# Patient Record
Sex: Male | Born: 1935 | Race: White | Hispanic: No | Marital: Married | Smoking: Former smoker
Health system: Southern US, Community
[De-identification: ages and names within clinical notes are randomized; demographics above are authoritative.]

## PROBLEM LIST (undated history)

## (undated) ENCOUNTER — Emergency Department (HOSPITAL_COMMUNITY): Discharge status: Absent without leave | Payer: Medicare HMO

## (undated) DIAGNOSIS — I10 Essential (primary) hypertension: Secondary | ICD-10-CM

## (undated) DIAGNOSIS — I442 Atrioventricular block, complete: Secondary | ICD-10-CM

## (undated) DIAGNOSIS — C801 Malignant (primary) neoplasm, unspecified: Secondary | ICD-10-CM

## (undated) DIAGNOSIS — N529 Male erectile dysfunction, unspecified: Secondary | ICD-10-CM

## (undated) DIAGNOSIS — K224 Dyskinesia of esophagus: Secondary | ICD-10-CM

## (undated) DIAGNOSIS — J449 Chronic obstructive pulmonary disease, unspecified: Secondary | ICD-10-CM

## (undated) DIAGNOSIS — K219 Gastro-esophageal reflux disease without esophagitis: Secondary | ICD-10-CM

## (undated) DIAGNOSIS — I872 Venous insufficiency (chronic) (peripheral): Secondary | ICD-10-CM

## (undated) DIAGNOSIS — N289 Disorder of kidney and ureter, unspecified: Secondary | ICD-10-CM

## (undated) DIAGNOSIS — M1990 Unspecified osteoarthritis, unspecified site: Secondary | ICD-10-CM

## (undated) DIAGNOSIS — E785 Hyperlipidemia, unspecified: Secondary | ICD-10-CM

## (undated) DIAGNOSIS — I350 Nonrheumatic aortic (valve) stenosis: Secondary | ICD-10-CM

## (undated) DIAGNOSIS — Z950 Presence of cardiac pacemaker: Secondary | ICD-10-CM

## (undated) DIAGNOSIS — N400 Enlarged prostate without lower urinary tract symptoms: Secondary | ICD-10-CM

## (undated) DIAGNOSIS — K5730 Diverticulosis of large intestine without perforation or abscess without bleeding: Secondary | ICD-10-CM

## (undated) HISTORY — DX: Disorder of kidney and ureter, unspecified: N28.9

## (undated) HISTORY — DX: Atrioventricular block, complete: I44.2

## (undated) HISTORY — PX: COLONOSCOPY: SHX174

## (undated) HISTORY — PX: TONSILLECTOMY: SUR1361

## (undated) HISTORY — DX: Benign prostatic hyperplasia without lower urinary tract symptoms: N40.0

## (undated) HISTORY — DX: Malignant (primary) neoplasm, unspecified: C80.1

## (undated) HISTORY — DX: Essential (primary) hypertension: I10

## (undated) HISTORY — PX: CHOLECYSTECTOMY: SHX55

## (undated) HISTORY — DX: Unspecified osteoarthritis, unspecified site: M19.90

## (undated) HISTORY — DX: Hyperlipidemia, unspecified: E78.5

## (undated) HISTORY — DX: Diverticulosis of large intestine without perforation or abscess without bleeding: K57.30

## (undated) HISTORY — DX: Presence of cardiac pacemaker: Z95.0

## (undated) HISTORY — DX: Gastro-esophageal reflux disease without esophagitis: K21.9

## (undated) HISTORY — DX: Chronic obstructive pulmonary disease, unspecified: J44.9

## (undated) HISTORY — DX: Venous insufficiency (chronic) (peripheral): I87.2

## (undated) HISTORY — DX: Dyskinesia of esophagus: K22.4

## (undated) HISTORY — DX: Male erectile dysfunction, unspecified: N52.9

## (undated) HISTORY — DX: Nonrheumatic aortic (valve) stenosis: I35.0

## (undated) HISTORY — PX: VARICOSE VEIN SURGERY: SHX832

## (undated) HISTORY — PX: OTHER SURGICAL HISTORY: SHX169

---

## 1988-04-04 HISTORY — PX: PROSTATE SURGERY: SHX751

## 1989-04-04 HISTORY — PX: LOBECTOMY: SHX5089

## 2003-01-24 ENCOUNTER — Encounter: Payer: Self-pay | Admitting: Internal Medicine

## 2003-01-24 DIAGNOSIS — K644 Residual hemorrhoidal skin tags: Secondary | ICD-10-CM | POA: Insufficient documentation

## 2003-01-24 LAB — HM COLONOSCOPY

## 2003-11-03 HISTORY — PX: OTHER SURGICAL HISTORY: SHX169

## 2004-05-25 ENCOUNTER — Ambulatory Visit: Payer: Self-pay | Admitting: Family Medicine

## 2004-06-23 ENCOUNTER — Ambulatory Visit: Payer: Self-pay | Admitting: Family Medicine

## 2004-07-03 HISTORY — PX: ESOPHAGOGASTRODUODENOSCOPY: SHX1529

## 2004-07-07 ENCOUNTER — Ambulatory Visit: Payer: Self-pay | Admitting: Family Medicine

## 2004-08-02 ENCOUNTER — Ambulatory Visit: Payer: Self-pay | Admitting: Cardiovascular Disease

## 2004-09-20 ENCOUNTER — Ambulatory Visit: Payer: Self-pay | Admitting: Internal Medicine

## 2004-10-01 ENCOUNTER — Ambulatory Visit: Payer: Self-pay | Admitting: Internal Medicine

## 2004-10-01 DIAGNOSIS — K222 Esophageal obstruction: Secondary | ICD-10-CM

## 2004-10-06 ENCOUNTER — Ambulatory Visit: Payer: Self-pay | Admitting: Family Medicine

## 2004-10-06 LAB — CONVERTED CEMR LAB: PSA: 0.9 ng/mL

## 2004-10-07 ENCOUNTER — Ambulatory Visit: Payer: Self-pay | Admitting: Family Medicine

## 2005-01-28 ENCOUNTER — Ambulatory Visit: Payer: Self-pay | Admitting: Family Medicine

## 2005-08-02 HISTORY — PX: OTHER SURGICAL HISTORY: SHX169

## 2005-08-09 ENCOUNTER — Ambulatory Visit: Payer: Self-pay | Admitting: Family Medicine

## 2005-08-18 ENCOUNTER — Encounter: Admission: RE | Admit: 2005-08-18 | Discharge: 2005-08-18 | Payer: Self-pay | Admitting: Family Medicine

## 2005-08-18 ENCOUNTER — Ambulatory Visit: Payer: Self-pay | Admitting: Cardiovascular Disease

## 2005-09-02 HISTORY — PX: DOPPLER ECHOCARDIOGRAPHY: SHX263

## 2005-09-22 ENCOUNTER — Ambulatory Visit: Payer: Self-pay | Admitting: Cardiovascular Disease

## 2005-09-22 ENCOUNTER — Encounter: Payer: Self-pay | Admitting: Cardiology

## 2005-09-22 ENCOUNTER — Ambulatory Visit: Payer: Self-pay

## 2005-10-02 HISTORY — PX: ESOPHAGOGASTRODUODENOSCOPY: SHX1529

## 2005-10-07 ENCOUNTER — Ambulatory Visit: Payer: Self-pay | Admitting: Family Medicine

## 2005-11-02 HISTORY — PX: OTHER SURGICAL HISTORY: SHX169

## 2005-12-07 ENCOUNTER — Ambulatory Visit: Payer: Self-pay | Admitting: Cardiovascular Disease

## 2005-12-12 ENCOUNTER — Ambulatory Visit: Payer: Self-pay | Admitting: Family Medicine

## 2006-01-24 ENCOUNTER — Ambulatory Visit: Payer: Self-pay | Admitting: Family Medicine

## 2006-04-10 ENCOUNTER — Ambulatory Visit: Payer: Self-pay | Admitting: Family Medicine

## 2006-04-28 ENCOUNTER — Ambulatory Visit: Payer: Self-pay | Admitting: Family Medicine

## 2006-07-24 ENCOUNTER — Encounter: Payer: Self-pay | Admitting: Family Medicine

## 2006-07-24 DIAGNOSIS — M1990 Unspecified osteoarthritis, unspecified site: Secondary | ICD-10-CM | POA: Insufficient documentation

## 2006-07-24 DIAGNOSIS — Z85118 Personal history of other malignant neoplasm of bronchus and lung: Secondary | ICD-10-CM

## 2006-07-24 DIAGNOSIS — Z87898 Personal history of other specified conditions: Secondary | ICD-10-CM

## 2006-07-24 DIAGNOSIS — K219 Gastro-esophageal reflux disease without esophagitis: Secondary | ICD-10-CM

## 2006-07-24 DIAGNOSIS — E785 Hyperlipidemia, unspecified: Secondary | ICD-10-CM

## 2006-07-24 DIAGNOSIS — F528 Other sexual dysfunction not due to a substance or known physiological condition: Secondary | ICD-10-CM

## 2006-07-24 DIAGNOSIS — J449 Chronic obstructive pulmonary disease, unspecified: Secondary | ICD-10-CM

## 2006-07-24 DIAGNOSIS — K5730 Diverticulosis of large intestine without perforation or abscess without bleeding: Secondary | ICD-10-CM | POA: Insufficient documentation

## 2006-07-24 DIAGNOSIS — I10 Essential (primary) hypertension: Secondary | ICD-10-CM | POA: Insufficient documentation

## 2006-07-24 DIAGNOSIS — I2510 Atherosclerotic heart disease of native coronary artery without angina pectoris: Secondary | ICD-10-CM | POA: Insufficient documentation

## 2006-07-24 DIAGNOSIS — I872 Venous insufficiency (chronic) (peripheral): Secondary | ICD-10-CM | POA: Insufficient documentation

## 2006-07-24 DIAGNOSIS — T7840XA Allergy, unspecified, initial encounter: Secondary | ICD-10-CM | POA: Insufficient documentation

## 2006-07-24 DIAGNOSIS — J4489 Other specified chronic obstructive pulmonary disease: Secondary | ICD-10-CM | POA: Insufficient documentation

## 2006-09-01 ENCOUNTER — Telehealth (INDEPENDENT_AMBULATORY_CARE_PROVIDER_SITE_OTHER): Payer: Self-pay | Admitting: *Deleted

## 2006-09-29 ENCOUNTER — Telehealth (INDEPENDENT_AMBULATORY_CARE_PROVIDER_SITE_OTHER): Payer: Self-pay | Admitting: *Deleted

## 2006-10-25 ENCOUNTER — Ambulatory Visit: Payer: Self-pay | Admitting: Family Medicine

## 2006-10-25 DIAGNOSIS — M109 Gout, unspecified: Secondary | ICD-10-CM | POA: Insufficient documentation

## 2006-12-12 ENCOUNTER — Telehealth (INDEPENDENT_AMBULATORY_CARE_PROVIDER_SITE_OTHER): Payer: Self-pay | Admitting: *Deleted

## 2006-12-12 ENCOUNTER — Ambulatory Visit: Payer: Self-pay | Admitting: Cardiovascular Disease

## 2007-01-12 ENCOUNTER — Ambulatory Visit: Payer: Self-pay | Admitting: Internal Medicine

## 2007-01-16 ENCOUNTER — Encounter: Admission: RE | Admit: 2007-01-16 | Discharge: 2007-01-16 | Payer: Self-pay | Admitting: Internal Medicine

## 2007-01-16 DIAGNOSIS — K449 Diaphragmatic hernia without obstruction or gangrene: Secondary | ICD-10-CM | POA: Insufficient documentation

## 2007-02-07 ENCOUNTER — Ambulatory Visit: Payer: Self-pay | Admitting: Internal Medicine

## 2007-03-06 ENCOUNTER — Telehealth (INDEPENDENT_AMBULATORY_CARE_PROVIDER_SITE_OTHER): Payer: Self-pay | Admitting: *Deleted

## 2007-03-14 ENCOUNTER — Encounter (INDEPENDENT_AMBULATORY_CARE_PROVIDER_SITE_OTHER): Payer: Self-pay | Admitting: *Deleted

## 2007-03-20 ENCOUNTER — Telehealth (INDEPENDENT_AMBULATORY_CARE_PROVIDER_SITE_OTHER): Payer: Self-pay | Admitting: *Deleted

## 2007-03-20 ENCOUNTER — Ambulatory Visit: Payer: Self-pay | Admitting: Family Medicine

## 2007-03-21 LAB — CONVERTED CEMR LAB
AST: 21 units/L (ref 0–37)
Albumin: 4 g/dL (ref 3.5–5.2)
BUN: 22 mg/dL (ref 6–23)
Basophils Absolute: 0 10*3/uL (ref 0.0–0.1)
Basophils Relative: 0.6 % (ref 0.0–1.0)
CO2: 28 meq/L (ref 19–32)
Calcium: 9.7 mg/dL (ref 8.4–10.5)
Eosinophils Relative: 4.1 % (ref 0.0–5.0)
GFR calc Af Amer: 57 mL/min
GFR calc non Af Amer: 47 mL/min
HCT: 41.7 % (ref 39.0–52.0)
HDL: 34.4 mg/dL — ABNORMAL LOW (ref >39.0)
Hemoglobin: 14.5 g/dL (ref 13.0–17.0)
Lymphocytes Relative: 34.9 % (ref 12.0–46.0)
MCHC: 34.7 g/dL (ref 30.0–36.0)
Monocytes Absolute: 0.5 10*3/uL (ref 0.2–0.7)
Neutro Abs: 2.8 10*3/uL (ref 1.4–7.7)
Neutrophils Relative %: 50.5 % (ref 43.0–77.0)
Phosphorus: 2.9 mg/dL (ref 2.3–4.6)
RBC: 4.44 M/uL (ref 4.22–5.81)
Sodium: 141 meq/L (ref 135–145)
Triglycerides: 269 mg/dL (ref 0–149)

## 2007-06-03 HISTORY — PX: OTHER SURGICAL HISTORY: SHX169

## 2007-06-08 ENCOUNTER — Telehealth: Payer: Self-pay | Admitting: Family Medicine

## 2007-06-12 ENCOUNTER — Ambulatory Visit: Payer: Self-pay | Admitting: Internal Medicine

## 2007-06-13 ENCOUNTER — Ambulatory Visit: Payer: Self-pay | Admitting: Cardiovascular Disease

## 2007-06-21 ENCOUNTER — Ambulatory Visit: Payer: Self-pay | Admitting: Family Medicine

## 2007-07-03 ENCOUNTER — Ambulatory Visit: Payer: Self-pay

## 2007-07-03 ENCOUNTER — Encounter: Payer: Self-pay | Admitting: Cardiovascular Disease

## 2007-07-05 ENCOUNTER — Ambulatory Visit: Payer: Self-pay | Admitting: Family Medicine

## 2007-10-09 ENCOUNTER — Ambulatory Visit: Payer: Self-pay | Admitting: Family Medicine

## 2007-10-10 ENCOUNTER — Encounter (INDEPENDENT_AMBULATORY_CARE_PROVIDER_SITE_OTHER): Payer: Self-pay | Admitting: *Deleted

## 2007-10-10 LAB — CONVERTED CEMR LAB
ALT: 19 units/L (ref 0–53)
Direct LDL: 71.3 mg/dL
HDL: 32.8 mg/dL — ABNORMAL LOW (ref >39.0)
Total CHOL/HDL Ratio: 5.6
Triglycerides: 334 mg/dL (ref 0–149)
VLDL: 67 mg/dL — ABNORMAL HIGH (ref 0–40)

## 2008-01-02 ENCOUNTER — Ambulatory Visit: Payer: Self-pay | Admitting: Cardiovascular Disease

## 2008-01-09 ENCOUNTER — Ambulatory Visit: Payer: Self-pay | Admitting: Family Medicine

## 2008-01-10 LAB — CONVERTED CEMR LAB
AST: 18 units/L (ref 0–37)
BUN: 27 mg/dL — ABNORMAL HIGH (ref 6–23)
Bilirubin, Direct: 0.1 mg/dL (ref 0.0–0.3)
Calcium: 9.3 mg/dL (ref 8.4–10.5)
Chloride: 109 meq/L (ref 96–112)
Cholesterol: 170 mg/dL (ref 0–200)
GFR calc Af Amer: 62 mL/min
GFR calc non Af Amer: 51 mL/min
Phosphorus: 3.3 mg/dL (ref 2.3–4.6)
Potassium: 4.7 meq/L (ref 3.5–5.1)
Sodium: 143 meq/L (ref 135–145)
Total Bilirubin: 0.7 mg/dL (ref 0.3–1.2)
Triglycerides: 205 mg/dL (ref 0–149)

## 2008-01-22 ENCOUNTER — Telehealth: Payer: Self-pay | Admitting: Family Medicine

## 2008-01-29 ENCOUNTER — Ambulatory Visit: Payer: Self-pay | Admitting: Internal Medicine

## 2008-02-11 ENCOUNTER — Telehealth: Payer: Self-pay | Admitting: Family Medicine

## 2008-02-22 ENCOUNTER — Ambulatory Visit: Payer: Self-pay | Admitting: Family Medicine

## 2008-02-22 DIAGNOSIS — N184 Chronic kidney disease, stage 4 (severe): Secondary | ICD-10-CM

## 2008-04-29 ENCOUNTER — Ambulatory Visit: Payer: Self-pay | Admitting: Cardiovascular Disease

## 2008-06-09 ENCOUNTER — Telehealth: Payer: Self-pay | Admitting: Internal Medicine

## 2008-06-30 ENCOUNTER — Telehealth: Payer: Self-pay | Admitting: Family Medicine

## 2008-07-08 DIAGNOSIS — M129 Arthropathy, unspecified: Secondary | ICD-10-CM | POA: Insufficient documentation

## 2008-07-09 ENCOUNTER — Encounter: Payer: Self-pay | Admitting: Cardiovascular Disease

## 2008-07-09 ENCOUNTER — Ambulatory Visit: Payer: Self-pay

## 2008-07-09 ENCOUNTER — Ambulatory Visit: Payer: Self-pay | Admitting: Cardiovascular Disease

## 2008-08-25 ENCOUNTER — Telehealth: Payer: Self-pay | Admitting: Cardiovascular Disease

## 2008-08-25 ENCOUNTER — Ambulatory Visit: Payer: Self-pay | Admitting: Family Medicine

## 2008-08-26 ENCOUNTER — Telehealth: Payer: Self-pay | Admitting: Cardiovascular Disease

## 2008-08-26 LAB — CONVERTED CEMR LAB
Alkaline Phosphatase: 118 units/L — ABNORMAL HIGH (ref 39–117)
Calcium: 9.3 mg/dL (ref 8.4–10.5)
Cholesterol: 180 mg/dL (ref 0–200)
Creatinine, Ser: 1.5 mg/dL (ref 0.4–1.5)
Direct LDL: 70.2 mg/dL
HDL: 32.6 mg/dL — ABNORMAL LOW (ref >39.00)
Potassium: 4.6 meq/L (ref 3.5–5.1)
Total CHOL/HDL Ratio: 6
Triglycerides: 309 mg/dL — ABNORMAL HIGH (ref 0.0–149.0)
VLDL: 61.8 mg/dL — ABNORMAL HIGH (ref 0.0–40.0)

## 2008-08-27 ENCOUNTER — Ambulatory Visit: Payer: Self-pay | Admitting: Cardiovascular Disease

## 2008-08-27 DIAGNOSIS — I4949 Other premature depolarization: Secondary | ICD-10-CM | POA: Insufficient documentation

## 2008-08-27 DIAGNOSIS — R55 Syncope and collapse: Secondary | ICD-10-CM

## 2008-08-27 DIAGNOSIS — I359 Nonrheumatic aortic valve disorder, unspecified: Secondary | ICD-10-CM | POA: Insufficient documentation

## 2008-09-22 ENCOUNTER — Ambulatory Visit: Payer: Self-pay | Admitting: Cardiovascular Disease

## 2008-10-28 ENCOUNTER — Ambulatory Visit: Payer: Self-pay | Admitting: Family Medicine

## 2008-10-29 LAB — CONVERTED CEMR LAB
ALT: 18 units/L (ref 0–53)
Alkaline Phosphatase: 112 units/L (ref 39–117)
BUN: 29 mg/dL — ABNORMAL HIGH (ref 6–23)
Bilirubin, Direct: 0.1 mg/dL (ref 0.0–0.3)
CO2: 26 meq/L (ref 19–32)
Calcium: 9.5 mg/dL (ref 8.4–10.5)
Cholesterol: 199 mg/dL (ref 0–200)
Creatinine, Ser: 1.6 mg/dL — ABNORMAL HIGH (ref 0.4–1.5)
HDL: 38.1 mg/dL — ABNORMAL LOW (ref >39.00)
Phosphorus: 3.5 mg/dL (ref 2.3–4.6)
Sodium: 142 meq/L (ref 135–145)
Total Bilirubin: 0.9 mg/dL (ref 0.3–1.2)
Total Protein: 7.5 g/dL (ref 6.0–8.3)
VLDL: 60 mg/dL — ABNORMAL HIGH (ref 0.0–40.0)

## 2008-11-07 ENCOUNTER — Ambulatory Visit: Payer: Self-pay | Admitting: Family Medicine

## 2008-11-07 DIAGNOSIS — K5900 Constipation, unspecified: Secondary | ICD-10-CM | POA: Insufficient documentation

## 2008-11-07 LAB — CONVERTED CEMR LAB
Blood in Urine, dipstick: NEGATIVE
Glucose, Urine, Semiquant: NEGATIVE
Protein, U semiquant: NEGATIVE
Urobilinogen, UA: 0.2
pH: 6

## 2008-11-26 ENCOUNTER — Encounter (INDEPENDENT_AMBULATORY_CARE_PROVIDER_SITE_OTHER): Payer: Self-pay | Admitting: *Deleted

## 2008-12-16 ENCOUNTER — Ambulatory Visit: Payer: Self-pay | Admitting: Internal Medicine

## 2008-12-17 LAB — CONVERTED CEMR LAB
Basophils Absolute: 0.1 10*3/uL (ref 0.0–0.1)
Eosinophils Absolute: 0.1 10*3/uL (ref 0.0–0.7)
Eosinophils Relative: 2.6 % (ref 0.0–5.0)
Lymphs Abs: 1.6 10*3/uL (ref 0.7–4.0)
MCV: 94 fL (ref 78.0–100.0)
Monocytes Relative: 8.1 % (ref 3.0–12.0)
Neutro Abs: 3.4 10*3/uL (ref 1.4–7.7)
Neutrophils Relative %: 59.6 % (ref 43.0–77.0)
Platelets: 230 10*3/uL (ref 150.0–400.0)
RBC: 4.57 M/uL (ref 4.22–5.81)
WBC: 5.7 10*3/uL (ref 4.5–10.5)

## 2009-01-12 ENCOUNTER — Telehealth: Payer: Self-pay | Admitting: Family Medicine

## 2009-01-20 ENCOUNTER — Ambulatory Visit: Payer: Self-pay | Admitting: Cardiovascular Disease

## 2009-01-20 DIAGNOSIS — R0602 Shortness of breath: Secondary | ICD-10-CM

## 2009-01-21 ENCOUNTER — Telehealth: Payer: Self-pay | Admitting: Cardiovascular Disease

## 2009-01-22 LAB — CONVERTED CEMR LAB
BUN: 24 mg/dL — ABNORMAL HIGH (ref 6–23)
CO2: 27 meq/L (ref 19–32)
Calcium: 9.4 mg/dL (ref 8.4–10.5)
Glucose, Bld: 88 mg/dL (ref 70–99)
Potassium: 4.6 meq/L (ref 3.5–5.1)
Sodium: 141 meq/L (ref 135–145)

## 2009-01-23 ENCOUNTER — Telehealth: Payer: Self-pay | Admitting: Cardiovascular Disease

## 2009-03-02 ENCOUNTER — Telehealth: Payer: Self-pay | Admitting: Internal Medicine

## 2009-03-04 ENCOUNTER — Telehealth: Payer: Self-pay | Admitting: Family Medicine

## 2009-03-11 ENCOUNTER — Ambulatory Visit: Payer: Self-pay | Admitting: Cardiovascular Disease

## 2009-03-16 ENCOUNTER — Encounter: Payer: Self-pay | Admitting: Emergency Medicine

## 2009-03-16 ENCOUNTER — Encounter: Payer: Self-pay | Admitting: Cardiovascular Disease

## 2009-03-17 ENCOUNTER — Telehealth: Payer: Self-pay | Admitting: Cardiovascular Disease

## 2009-03-18 LAB — CONVERTED CEMR LAB
Calcium: 9 mg/dL (ref 8.4–10.5)
GFR calc non Af Amer: 43.74 mL/min (ref >60)
Pro B Natriuretic peptide (BNP): 1080 pg/mL — ABNORMAL HIGH (ref 0.0–100.0)
Sodium: 144 meq/L (ref 135–145)

## 2009-03-24 ENCOUNTER — Telehealth: Payer: Self-pay | Admitting: Internal Medicine

## 2009-04-17 ENCOUNTER — Telehealth: Payer: Self-pay | Admitting: Cardiovascular Disease

## 2009-04-27 ENCOUNTER — Telehealth: Payer: Self-pay | Admitting: Cardiovascular Disease

## 2009-05-01 ENCOUNTER — Encounter: Payer: Self-pay | Admitting: Cardiovascular Disease

## 2009-05-04 ENCOUNTER — Telehealth: Payer: Self-pay | Admitting: Cardiovascular Disease

## 2009-05-04 ENCOUNTER — Telehealth: Payer: Self-pay | Admitting: Family Medicine

## 2009-05-04 HISTORY — PX: AORTIC VALVE REPLACEMENT: SHX41

## 2009-05-08 HISTORY — PX: PACEMAKER INSERTION: SHX728

## 2009-05-25 ENCOUNTER — Telehealth: Payer: Self-pay | Admitting: Family Medicine

## 2009-06-08 ENCOUNTER — Encounter: Payer: Self-pay | Admitting: Cardiovascular Disease

## 2009-06-17 ENCOUNTER — Telehealth: Payer: Self-pay | Admitting: Family Medicine

## 2009-07-01 ENCOUNTER — Encounter: Payer: Self-pay | Admitting: Cardiovascular Disease

## 2009-07-03 ENCOUNTER — Ambulatory Visit: Payer: Self-pay | Admitting: Cardiovascular Disease

## 2009-07-06 LAB — CONVERTED CEMR LAB
CO2: 28 meq/L (ref 19–32)
Calcium: 9.1 mg/dL (ref 8.4–10.5)
Chloride: 105 meq/L (ref 96–112)
Eosinophils Relative: 1.5 % (ref 0.0–5.0)
HCT: 38.7 % — ABNORMAL LOW (ref 39.0–52.0)
Hemoglobin: 13 g/dL (ref 13.0–17.0)
Lymphocytes Relative: 24 % (ref 12.0–46.0)
Lymphs Abs: 1.5 10*3/uL (ref 0.7–4.0)
MCV: 94.7 fL (ref 78.0–100.0)
Monocytes Relative: 8.8 % (ref 3.0–12.0)
Neutrophils Relative %: 65.3 % (ref 43.0–77.0)
Platelets: 255 10*3/uL (ref 150.0–400.0)
Pro B Natriuretic peptide (BNP): 489 pg/mL — ABNORMAL HIGH (ref 0.0–100.0)
Sodium: 145 meq/L (ref 135–145)
WBC: 6.1 10*3/uL (ref 4.5–10.5)

## 2009-07-13 ENCOUNTER — Telehealth: Payer: Self-pay | Admitting: Cardiovascular Disease

## 2009-07-20 ENCOUNTER — Encounter: Payer: Self-pay | Admitting: Cardiovascular Disease

## 2009-07-20 ENCOUNTER — Telehealth: Payer: Self-pay | Admitting: Cardiovascular Disease

## 2009-07-20 ENCOUNTER — Telehealth: Payer: Self-pay | Admitting: Internal Medicine

## 2009-07-30 ENCOUNTER — Ambulatory Visit: Payer: Self-pay | Admitting: Cardiovascular Disease

## 2009-07-30 ENCOUNTER — Ambulatory Visit: Payer: Self-pay | Admitting: Cardiology

## 2009-07-30 ENCOUNTER — Ambulatory Visit: Payer: Self-pay

## 2009-07-30 ENCOUNTER — Encounter: Payer: Self-pay | Admitting: Cardiovascular Disease

## 2009-07-30 ENCOUNTER — Ambulatory Visit (HOSPITAL_COMMUNITY)
Admission: RE | Admit: 2009-07-30 | Discharge: 2009-07-30 | Payer: Self-pay | Source: Home / Self Care | Admitting: Cardiovascular Disease

## 2009-07-30 DIAGNOSIS — I4891 Unspecified atrial fibrillation: Secondary | ICD-10-CM | POA: Insufficient documentation

## 2009-07-31 ENCOUNTER — Ambulatory Visit: Payer: Self-pay | Admitting: Internal Medicine

## 2009-08-02 ENCOUNTER — Encounter: Payer: Self-pay | Admitting: Cardiovascular Disease

## 2009-08-02 LAB — CONVERTED CEMR LAB
Chloride: 99 meq/L (ref 96–112)
Glucose, Bld: 95 mg/dL (ref 70–99)
Potassium: 3.9 meq/L (ref 3.5–5.1)

## 2009-08-06 ENCOUNTER — Ambulatory Visit: Payer: Self-pay | Admitting: Cardiology

## 2009-08-06 LAB — CONVERTED CEMR LAB: POC INR: 2.5

## 2009-08-11 ENCOUNTER — Ambulatory Visit: Payer: Self-pay | Admitting: Cardiovascular Disease

## 2009-08-14 ENCOUNTER — Encounter (INDEPENDENT_AMBULATORY_CARE_PROVIDER_SITE_OTHER): Payer: Self-pay | Admitting: *Deleted

## 2009-08-14 ENCOUNTER — Ambulatory Visit: Payer: Self-pay | Admitting: Cardiovascular Disease

## 2009-08-18 ENCOUNTER — Ambulatory Visit: Payer: Self-pay | Admitting: Internal Medicine

## 2009-08-28 ENCOUNTER — Ambulatory Visit: Payer: Self-pay | Admitting: Cardiovascular Disease

## 2009-08-28 LAB — CONVERTED CEMR LAB
CO2: 34 meq/L — ABNORMAL HIGH (ref 19–32)
Creatinine, Ser: 2.2 mg/dL — ABNORMAL HIGH (ref 0.4–1.5)
Eosinophils Absolute: 0.1 10*3/uL (ref 0.0–0.7)
Eosinophils Relative: 2.2 % (ref 0.0–5.0)
Glucose, Bld: 73 mg/dL (ref 70–99)
HCT: 40.7 % (ref 39.0–52.0)
Lymphs Abs: 1.9 10*3/uL (ref 0.7–4.0)
MCHC: 32.6 g/dL (ref 30.0–36.0)
MCV: 91.4 fL (ref 78.0–100.0)
Magnesium: 2.4 mg/dL (ref 1.5–2.5)
Monocytes Absolute: 0.7 10*3/uL (ref 0.1–1.0)
Monocytes Relative: 11.2 % (ref 3.0–12.0)
Neutro Abs: 3.8 10*3/uL (ref 1.4–7.7)
POC INR: 4.2
Potassium: 4.4 meq/L (ref 3.5–5.1)
Pro B Natriuretic peptide (BNP): 605.5 pg/mL — ABNORMAL HIGH (ref 0.0–100.0)
RBC: 4.45 M/uL (ref 4.22–5.81)
RDW: 18.5 % — ABNORMAL HIGH (ref 11.5–14.6)
Sodium: 144 meq/L (ref 135–145)

## 2009-09-02 ENCOUNTER — Encounter: Payer: Self-pay | Admitting: Cardiovascular Disease

## 2009-09-04 ENCOUNTER — Ambulatory Visit (HOSPITAL_COMMUNITY): Admission: RE | Admit: 2009-09-04 | Discharge: 2009-09-04 | Payer: Self-pay | Admitting: Cardiovascular Disease

## 2009-09-04 ENCOUNTER — Ambulatory Visit: Payer: Self-pay | Admitting: Cardiovascular Disease

## 2009-09-08 ENCOUNTER — Ambulatory Visit: Payer: Self-pay | Admitting: Cardiology

## 2009-09-18 ENCOUNTER — Ambulatory Visit: Payer: Self-pay | Admitting: Cardiology

## 2009-09-18 ENCOUNTER — Ambulatory Visit: Payer: Self-pay | Admitting: Cardiovascular Disease

## 2009-09-18 LAB — CONVERTED CEMR LAB: POC INR: 1.9

## 2009-09-28 ENCOUNTER — Ambulatory Visit: Payer: Self-pay | Admitting: Cardiology

## 2009-10-12 ENCOUNTER — Ambulatory Visit: Payer: Self-pay | Admitting: Internal Medicine

## 2009-10-16 ENCOUNTER — Ambulatory Visit: Payer: Self-pay | Admitting: Emergency Medicine

## 2009-10-16 ENCOUNTER — Ambulatory Visit
Admission: RE | Admit: 2009-10-16 | Discharge: 2009-10-16 | Payer: Self-pay | Source: Home / Self Care | Admitting: Cardiovascular Disease

## 2009-10-16 DIAGNOSIS — J8410 Pulmonary fibrosis, unspecified: Secondary | ICD-10-CM

## 2009-10-22 ENCOUNTER — Ambulatory Visit: Payer: Self-pay | Admitting: Cardiology

## 2009-10-27 ENCOUNTER — Telehealth (INDEPENDENT_AMBULATORY_CARE_PROVIDER_SITE_OTHER): Payer: Self-pay | Admitting: *Deleted

## 2009-11-16 ENCOUNTER — Ambulatory Visit: Payer: Self-pay | Admitting: Cardiovascular Disease

## 2009-11-16 ENCOUNTER — Ambulatory Visit: Payer: Self-pay | Admitting: Cardiology

## 2009-11-27 ENCOUNTER — Ambulatory Visit: Payer: Self-pay | Admitting: Emergency Medicine

## 2009-11-27 DIAGNOSIS — J309 Allergic rhinitis, unspecified: Secondary | ICD-10-CM | POA: Insufficient documentation

## 2009-12-03 ENCOUNTER — Telehealth: Payer: Self-pay | Admitting: Family Medicine

## 2009-12-11 ENCOUNTER — Ambulatory Visit: Payer: Self-pay | Admitting: Internal Medicine

## 2009-12-28 ENCOUNTER — Ambulatory Visit: Payer: Self-pay | Admitting: Cardiovascular Disease

## 2010-01-22 ENCOUNTER — Ambulatory Visit: Payer: Self-pay | Admitting: Internal Medicine

## 2010-01-27 ENCOUNTER — Ambulatory Visit: Payer: Self-pay | Admitting: Emergency Medicine

## 2010-02-10 ENCOUNTER — Ambulatory Visit: Payer: Self-pay | Admitting: Cardiovascular Disease

## 2010-02-16 LAB — CONVERTED CEMR LAB
Albumin: 3.7 g/dL (ref 3.5–5.2)
Alkaline Phosphatase: 116 units/L (ref 39–117)
Total Protein: 7.1 g/dL (ref 6.0–8.3)

## 2010-02-17 ENCOUNTER — Telehealth: Payer: Self-pay | Admitting: Emergency Medicine

## 2010-02-17 ENCOUNTER — Telehealth (INDEPENDENT_AMBULATORY_CARE_PROVIDER_SITE_OTHER): Payer: Self-pay | Admitting: *Deleted

## 2010-02-17 ENCOUNTER — Encounter: Payer: Self-pay | Admitting: Emergency Medicine

## 2010-03-02 ENCOUNTER — Ambulatory Visit: Payer: Self-pay | Admitting: Cardiology

## 2010-03-23 ENCOUNTER — Ambulatory Visit: Payer: Self-pay | Admitting: Cardiology

## 2010-03-23 LAB — CONVERTED CEMR LAB: POC INR: 2.4

## 2010-04-06 ENCOUNTER — Telehealth: Payer: Self-pay | Admitting: Cardiovascular Disease

## 2010-04-09 ENCOUNTER — Other Ambulatory Visit: Payer: Self-pay | Admitting: Family Medicine

## 2010-04-09 ENCOUNTER — Ambulatory Visit
Admission: RE | Admit: 2010-04-09 | Discharge: 2010-04-09 | Payer: Self-pay | Source: Home / Self Care | Attending: Family Medicine | Admitting: Family Medicine

## 2010-04-09 DIAGNOSIS — J209 Acute bronchitis, unspecified: Secondary | ICD-10-CM | POA: Insufficient documentation

## 2010-04-09 LAB — RENAL FUNCTION PANEL
Albumin: 3.2 g/dL — ABNORMAL LOW (ref 3.5–5.2)
BUN: 31 mg/dL — ABNORMAL HIGH (ref 6–23)
CO2: 31 mEq/L (ref 19–32)
Calcium: 8.8 mg/dL (ref 8.4–10.5)
Chloride: 105 mEq/L (ref 96–112)
Creatinine, Ser: 2.1 mg/dL — ABNORMAL HIGH (ref 0.4–1.5)
GFR: 32.05 mL/min — ABNORMAL LOW (ref >60.00)
Glucose, Bld: 77 mg/dL (ref 70–99)
Phosphorus: 3.7 mg/dL (ref 2.3–4.6)
Potassium: 5.5 mEq/L — ABNORMAL HIGH (ref 3.5–5.1)
Sodium: 144 mEq/L (ref 135–145)

## 2010-04-09 LAB — LIPID PANEL
Cholesterol: 107 mg/dL (ref 0–200)
HDL: 40.8 mg/dL (ref >39.00)
LDL Cholesterol: 48 mg/dL (ref 0–99)
Total CHOL/HDL Ratio: 3
Triglycerides: 89 mg/dL (ref 0.0–149.0)
VLDL: 17.8 mg/dL (ref 0.0–40.0)

## 2010-04-09 LAB — AST: AST: 14 U/L (ref 0–37)

## 2010-04-09 LAB — ALT: ALT: 10 U/L (ref 0–53)

## 2010-04-13 ENCOUNTER — Ambulatory Visit: Admission: RE | Admit: 2010-04-13 | Discharge: 2010-04-13 | Payer: Self-pay | Source: Home / Self Care

## 2010-04-14 ENCOUNTER — Telehealth: Payer: Self-pay | Admitting: Family Medicine

## 2010-04-23 ENCOUNTER — Encounter: Payer: Self-pay | Admitting: Internal Medicine

## 2010-04-23 ENCOUNTER — Ambulatory Visit: Admission: RE | Admit: 2010-04-23 | Discharge: 2010-04-23 | Payer: Self-pay | Source: Home / Self Care

## 2010-04-23 ENCOUNTER — Ambulatory Visit
Admission: RE | Admit: 2010-04-23 | Discharge: 2010-04-23 | Payer: Self-pay | Source: Home / Self Care | Attending: Cardiovascular Disease | Admitting: Cardiovascular Disease

## 2010-04-26 ENCOUNTER — Other Ambulatory Visit: Payer: Self-pay | Admitting: Family Medicine

## 2010-04-26 ENCOUNTER — Ambulatory Visit
Admission: RE | Admit: 2010-04-26 | Discharge: 2010-04-26 | Payer: Self-pay | Source: Home / Self Care | Attending: Family Medicine | Admitting: Family Medicine

## 2010-04-26 ENCOUNTER — Ambulatory Visit: Admit: 2010-04-26 | Payer: Self-pay | Admitting: Family Medicine

## 2010-04-26 LAB — POTASSIUM: Potassium: 5 mEq/L (ref 3.5–5.1)

## 2010-04-27 ENCOUNTER — Ambulatory Visit
Admission: RE | Admit: 2010-04-27 | Discharge: 2010-04-27 | Payer: Self-pay | Source: Home / Self Care | Attending: Emergency Medicine | Admitting: Emergency Medicine

## 2010-05-06 ENCOUNTER — Encounter: Payer: Self-pay | Admitting: Emergency Medicine

## 2010-05-06 NOTE — Assessment & Plan Note (Signed)
Summary: eph/ gd   Referring Provider:  Shepard [name] Primary Provider:  Shepard [name]  CC:  sob also sorness in chest due to surgery.  History of Present Illness: Adam [name] returns today for f.u  He was initially sent to [facility] for consdieration of percutaneous valve surgery.  He was found to have severe 3vd, severe AS.  They elected to proceed with open CABG with 25mm porcine valve.  He had a lima to lad, svg PDA, and svg OM.  Post op course was complicated by asystole and heart block.  He subsequently had a St Jude dual chamber pacer placed.  He has not started rehab at [facility] yet.  He has had some pleural effusions and chronic dypnea.  He had previous lung CA with LULobectomy and XRT before his CABG.  He was sent home on oxygen but is no longer on it now.  I suspect a lot of his current dyspnea is from lung disease but we will need to check a CXR and Hb/Hct.  Otherwise he is improving slowly walking to the mailbox.  He denies SSCP, palpitations, or edema.  he has chronic LE varices wtih mild phlebitis post surgery.  he has been compliant with his meds.    Current Problems (verified): 1)  Shortness of Breath  (ICD-786.05) 2)  Constipation  (ICD-564.00) 3)  Aortic Stenosis  (ICD-424.1) 4)  Premature Ventricular Contractions  (ICD-427.69) 5)  Syncope  (ICD-780.2) 6)  Hypercholesterolemia  (ICD-272.0) 7)  Arthritis  (ICD-716.90) 8)  Hx of Cad  (ICD-414.00) 9)  Hypertension  (ICD-401.9) 10)  Hyperlipidemia  (ICD-272.4) 11)  COPD  (ICD-496) 12)  Hx of Aortic Stenosis  (ICD-424.1) 13)  Renal Insufficiency  (ICD-588.9) 14)  Carcinoma, Colon, Family Hx  (ICD-V16.0) 15)  Rectal Bleeding  (ICD-569.3) 16)  Change in Bowels  (ICD-787.99) 17)  Gout, Unspecified  (ICD-274.9) 18)  Hiatal Hernia With Reflux  (ICD-553.3) 19)  Gastritis  (ICD-535.50) 20)  Esophageal Stricture  (ICD-530.3) 21)  Hemorrhoids, External  (ICD-455.3) 22)  Gouty Arthropathy  (ICD-274.0) 23)  Hx of Benign Prostatic  Hypertrophy, Hx of  (ICD-V13.8) 24)  Hx of Allergy  (ICD-995.3) 25)  Hx of Venous Insufficiency  (ICD-459.81) 26)  Erectile Dysfunction  (ICD-302.72) 27)  Osteoarthritis  (ICD-715.90) 28)  Gerd  (ICD-530.81) 29)  Diverticulosis, Colon  (ICD-562.10) 30)  Lung Cancer, Hx of  (ICD-V10.11)  Current Medications (verified): 1)  Tylenol 8 Hour 650 Mg Tbcr (Acetaminophen) .... Take 3 By Mouth Daily 2)  Prevacid 30 Mg Cpdr (Lansoprazole) .... Take 1 Tablet By Mouth Two Times A Day 3)  Ambien 10 Mg  Tabs (Zolpidem Tartrate) .Marland Kitchen.. 1 By Mouth At Bedtime As Needed Insomnia 4)  Aspirin 81 Mg Tbec (Aspirin) .... Take One By Mouth Once Daily 5)  Senokot 8.6 Mg Tabs (Sennosides) .... As Needed 6)  Colchicine 0.6 Mg Tabs (Colchicine) .... One Tablet By Mouth Twice Daily As Needed For Gout 7)  Furosemide 40 Mg Tabs (Furosemide) .Marland Kitchen.. 1 Tab By Mouth Two Times A Day 8)  Metoprolol Tartrate 25 Mg Tabs (Metoprolol Tartrate) .... Take One Tablet By Mouth Twice A Day 9)  Lipitor 10 Mg Tabs (Atorvastatin Calcium) .Marland Kitchen.. 1 Tab Op Once Daily 10)  Lopid 600 Mg Tabs (Gemfibrozil) .Marland Kitchen.. 1 Tab By Mouth Once Daily 11)  Amiodarone Hcl 200 Mg Tabs (Amiodarone Hcl) .Marland Kitchen.. 1 Tab By Mouth Once Daily 12)  Colace 100 Mg Caps (Docusate Sodium) .... As Needed 13)  Potassium Chloride Crys Cr 20 Meq  Cr-Tabs (Potassium Chloride Crys Cr) .Marland Kitchen.. 1 Tab By Mouth Once Daily  Allergies (verified): 1)  ! Pcn  Past History:  Past Medical History: Last updated: 12/16/2008  Lung cancer, hx of  COPD  Diverticulosis, colon  GERD/ Stricture  Esophageal dysmotility  Hyperlipidemia  Hypertension Osteoarthritis  Padget's  aortic stenosis  mild renal insufficiency  erectile dysfunction venous insufficiency Gout BPH  Past Surgical History: Last updated: 07/01/2009 Aortic valve replacement with 25 mm CE bovine pericardial valve and cornary artery bypass graft x3 with left internal mammary artery to left anterior descending, saphenous vein  graft to obutse marginal 2 and saphenous vein graft to posterior desending cornary arteries indivually on 05/04/09 Status post implantation of dual chamber pacemaker, [employer] on 05/08/09 Cholecystectomy Tonsillectomy Lung-lobectomy (1991) Prostate surgery (1990) Left thumb surgery Vein stripping Basal cell surgery- nose Stress test- pos.  AS on Echo, LVH (09/2001) EGD- stricture (07/207) Colonoscopy- diverticulosis, small polyp, hemorrhoids (03/2001) ,  diverticulosis, hemorrhoids (01/2003) Cardiolite- neg., mild-mod AS (11/2003) Carotids- neg (11/2005) EGD- gastritis, stricture, dysmotility (07/2004) Rib films- right 10th rib fracture (08/2005) Echo- stable (09/2005) 3/09 2D echo- mod aortic stenosis with EF 55%  Family History: Last updated: 12/16/2008 Father with CAD @ 90 mother with pancreatic ca sister with ?abdominal ca Colon Cancer: brother age 67- died   Social History: Last updated: 11/07/2008 quit smoking 1991 Patient is a former smoker.  Alcohol Use - no Daily Caffeine Use coffee at breakfast golf for exercise  Review of Systems       Denies fever, malais, weight loss, blurry vision, decreased visual acuity, cough, sputum,  hemoptysis, pleuritic pain, palpitaitons, heartburn, abdominal pain, melena, lower extremity edema, claudication, or rash. All other systems reviewed and negative except as noted in HPI   Vital Signs:  Patient profile:   77 year old male Height:      71 inches Weight:      172 pounds Pulse rate:   66 / minute Resp:     14 per minute BP sitting:   118 / 70  (left arm)  Vitals Entered By: Kem Parkinson (July 03, 2009 9:14 AM)  Physical Exam  [name]:  Affect appropriate Healthy:  appears stated age HEENT: normal Neck supple with no adenopathy JVP normal no bruits no thyromegaly Lungs clear with no wheezing and good diaphragmatic motion S/P left thoracotomy Heart:  S1/S2 SEM, murmur,rub, gallop or click PMI  normal Abdomen: benighn, BS positve, no tenderness, no AAA no bruit.  No HSM or HJR Distal pulses intact with no bruits No edema Neuro non-focal Skin warm and dry Phlebits with varicosities bilateral LE's   Impression & Recommendations:  Problem # 1:  AORTIC STENOSIS (ICD-424.1) S/P tissue AVR.  F/U echo for baseline LV function and gradients His updated medication list for this problem includes:    Furosemide 40 Mg Tabs (Furosemide) .Marland Kitchen... 1 tab by mouth two times a day    Metoprolol Tartrate 25 Mg Tabs (Metoprolol tartrate) .Marland Kitchen... Take one tablet by mouth twice a day  Orders: Cardiac Rehabilitation (Cardiac Rehab) Echocardiogram (Echo)  Problem # 2:  SHORTNESS OF BREATH (ICD-786.05) Likely from age, deconditioning and lung disease with previous LULobectomy for CA.  Check Hct and BNP Continue current dose diuretic His updated medication list for this problem includes:    Aspirin 81 Mg Tbec (Aspirin) .Marland Kitchen... Take one by mouth once daily    Furosemide 40 Mg Tabs (Furosemide) .Marland Kitchen... 1 tab by mouth two times a day  Metoprolol Tartrate 25 Mg Tabs (Metoprolol tartrate) .Marland Kitchen... Take one tablet by mouth twice a day  Orders: T-2 View CXR (71020TC) TLB-BNP (B-Natriuretic Peptide) (83880-BNPR)  Problem # 3:  HYPERCHOLESTEROLEMIA (ICD-272.0) Continue current meds with new bypass grafts The following medications were removed from the medication list:    Gemfibrozil 600 Mg Tabs (Gemfibrozil) .Marland Kitchen... Take 1 tablet by mouth twice a day His updated medication list for this problem includes:    Lipitor 10 Mg Tabs (Atorvastatin calcium) .Marland Kitchen... 1 tab op once daily    Lopid 600 Mg Tabs (Gemfibrozil) .Marland Kitchen... 1 tab by mouth once daily  CHOL: 199 (10/28/2008)   LDL: DEL (01/09/2008)   HDL: 38.10 (10/28/2008)   TG: 300.0 (10/28/2008)  Problem # 4:  Hx of CAD (ICD-414.00) 3VD with total RCA.  CABG with AVR No angina.  Continue asa and BB His updated medication list for this problem includes:    Aspirin 81  Mg Tbec (Aspirin) .Marland Kitchen... Take one by mouth once daily    Metoprolol Tartrate 25 Mg Tabs (Metoprolol tartrate) .Marland Kitchen... Take one tablet by mouth twice a day  Orders: TLB-CBC Platelet - w/Differential (85025-CBCD) TLB-BMP (Basic Metabolic Panel-BMET) (80048-METABOL)  Problem # 5:  HYPERTENSION (ICD-401.9) Well controlled His updated medication list for this problem includes:    Aspirin 81 Mg Tbec (Aspirin) .Marland Kitchen... Take one by mouth once daily    Furosemide 40 Mg Tabs (Furosemide) .Marland Kitchen... 1 tab by mouth two times a day    Metoprolol Tartrate 25 Mg Tabs (Metoprolol tartrate) .Marland Kitchen... Take one tablet by mouth twice a day  Problem # 6:  RENAL INSUFFICIENCY (ICD-588.9) Mild, check BMET post open heart surgery  Problem # 7:  COPD (ICD-496) With history of lung CA and LULobectomy.  Check CXR post-op and make sure there is no effusion  Problem # 8:  CARDIAC PACEMAKER IN SITU (ICD-V45.01) CHB post AVR.  St. Jude device.  F/U pacer clinic and confirm normal funcitoning  Patient Instructions: 1)  Your physician recommends that you schedule a follow-up appointment in: 4 WEEKS 2)  Your physician has requested that you have an echocardiogram.  Echocardiography is a painless test that uses sound waves to create images of your heart. It provides your doctor with information about the size and shape of your heart and how well your heart's chambers and valves are working.  This procedure takes approximately one hour. There are no restrictions for this procedure.SCHEDULE SAMEDAY AS APPT WITH DR Eden Emms 3)  REFERRAL TO DEVICE CLINIC  FOR PACER CHECK-ST JUDE 4)  CARDIAC REHAB WILL CONTACT YOU.

## 2010-05-06 NOTE — Assessment & Plan Note (Signed)
Summary: f87m/dm   Referring Provider:  [name] Primary Provider:  [name]  CC:  sob.  History of Present Illness: Adam [name] is seen today for F/U of dyspnea, afib, AVR, CAD.  We cardioverted him about two weeks ago and he is maintaining NSR.  He has a history of diastolic dysfunction and intolerance to afib but unfortunately his dyspnea is minimally improved.  He has good LV function and a normal functioning AVR.  He has been on an adequate dose of Lasix for mildly elevated BNP with higher doses causing prerenal azotemia.  He is on very low dose Amiodarone which will be stopped in 8 weeks if his pacer check shows maint of NSR.  His CXR shows diffuse interstitial lung disease and he had pre-existing COPD before his CABG/AVR at [facility] in March.  At this point we will refer him to pulmonary as I believe his dyspnea is primarily pulmonary at this point.  He finished cardiac rehab at [facility] and he may be a candidate for pulmonary rehab.  He had oxygen upon D/C from [facility] but doesn't think he needs it now.  He will get his INR checked today and again in 4 weeks.  He will visit his son in [place] next week as he is having back surgery.  ECG today confirms maint of NSR with AV pacing and P-synch pacing  Current Problems (verified): 1)  Encounter For Long-term Use of Other Medications  (ICD-V58.69) 2)  Coumadin Therapy  (ICD-V58.61) 3)  Atrial Fibrillation, Paroxysmal  (ICD-427.31) 4)  Cardiac Pacemaker in Situ  (ICD-V45.01) 5)  Shortness of Breath  (ICD-786.05) 6)  Constipation  (ICD-564.00) 7)  Aortic Stenosis  (ICD-424.1) 8)  Premature Ventricular Contractions  (ICD-427.69) 9)  Syncope  (ICD-780.2) 10)  Hypercholesterolemia  (ICD-272.0) 11)  Arthritis  (ICD-716.90) 12)  Hx of Cad  (ICD-414.00) 13)  Hypertension  (ICD-401.9) 14)  Hyperlipidemia  (ICD-272.4) 15)  COPD  (ICD-496) 16)  Renal Insufficiency  (ICD-588.9) 17)  Carcinoma, Colon, Family Hx  (ICD-V16.0) 18)  Rectal Bleeding   (ICD-569.3) 19)  Change in Bowels  (ICD-787.99) 20)  Gout, Unspecified  (ICD-274.9) 21)  Hiatal Hernia With Reflux  (ICD-553.3) 22)  Gastritis  (ICD-535.50) 23)  Esophageal Stricture  (ICD-530.3) 24)  Hemorrhoids, External  (ICD-455.3) 25)  Gouty Arthropathy  (ICD-274.0) 26)  Hx of Benign Prostatic Hypertrophy, Hx of  (ICD-V13.8) 27)  Hx of Allergy  (ICD-995.3) 28)  Hx of Venous Insufficiency  (ICD-459.81) 29)  Erectile Dysfunction  (ICD-302.72) 30)  Osteoarthritis  (ICD-715.90) 31)  Gerd  (ICD-530.81) 32)  Diverticulosis, Colon  (ICD-562.10) 33)  Lung Cancer, Hx of  (ICD-V10.11)  Current Medications (verified): 1)  Tylenol 8 Hour 650 Mg Tbcr (Acetaminophen) .... Take 3 By Mouth Daily 2)  Prevacid 30 Mg Cpdr (Lansoprazole) .... Take 1 Tablet By Mouth Two Times A Day 3)  Aspirin 81 Mg Tbec (Aspirin) .... Take One By Mouth Once Daily 4)  Senokot 8.6 Mg Tabs (Sennosides) .... As Needed 5)  Colchicine 0.6 Mg Tabs (Colchicine) .... One Tablet By Mouth Twice Daily As Needed For Gout 6)  Furosemide 40 Mg Tabs (Furosemide) .Marland Kitchen.. 1 Tab By Mouth By Mouth Daily 7)  Metoprolol Tartrate 50 Mg Tabs (Metoprolol Tartrate) .... Take One Tablet By Mouth Twice A Day 8)  Lipitor 10 Mg Tabs (Atorvastatin Calcium) .Marland Kitchen.. 1 Tab Op Once Daily 9)  Lopid 600 Mg Tabs (Gemfibrozil) .Marland Kitchen.. 1 Tab By Mouth Once Daily 10)  Amiodarone Hcl 200 Mg Tabs (Amiodarone  Hcl) .... 1 Tab By Mouth Once Daily 11)  Colace 100 Mg Caps (Docusate Sodium) .... As Needed 12)  Zoloft 25 Mg Tabs (Sertraline Hcl) .... 1/2  Tab By Mouth Once Daily 13)  Warfarin Sodium 2.5 Mg Tabs (Warfarin Sodium) .... Use As Directed By Anticoagualtion Clinic 14)  Meloxicam  (Meloxicam) .... 3 or 4 Times Weekly 15)  Benadryl 25 Mg Tabs (Diphenhydramine Hcl) .Marland Kitchen.. 1 Tab By Mouth Once Daily  Allergies (verified): 1)  ! Pcn  Past History:  Past Medical History: Last updated: 12/16/2008  Lung cancer, hx of  COPD  Diverticulosis, colon  GERD/  Stricture  Esophageal dysmotility  Hyperlipidemia  Hypertension Osteoarthritis  Padget's  aortic stenosis  mild renal insufficiency  erectile dysfunction venous insufficiency Gout BPH  Past Surgical History: Last updated: 07/01/2009 Aortic valve replacement with 25 mm CE bovine pericardial valve and cornary artery bypass graft x3 with left internal mammary artery to left anterior descending, saphenous vein graft to obutse marginal 2 and saphenous vein graft to posterior desending cornary arteries indivually on 05/04/09 Status post implantation of dual chamber pacemaker, [employer] on 05/08/09 Cholecystectomy Tonsillectomy Lung-lobectomy (1991) Prostate surgery (1990) Left thumb surgery Vein stripping Basal cell surgery- nose Stress test- pos.  AS on Echo, LVH (09/2001) EGD- stricture (07/207) Colonoscopy- diverticulosis, small polyp, hemorrhoids (03/2001) ,  diverticulosis, hemorrhoids (01/2003) Cardiolite- neg., mild-mod AS (11/2003) Carotids- neg (11/2005) EGD- gastritis, stricture, dysmotility (07/2004) Rib films- right 10th rib fracture (08/2005) Echo- stable (09/2005) 3/09 2D echo- mod aortic stenosis with EF 55%  Family History: Last updated: 12/16/2008 Father with CAD @ 46 mother with pancreatic ca sister with ?abdominal ca Colon Cancer: brother age 33- died   Social History: Last updated: 11/07/2008 quit smoking 1991 Patient is a former smoker.  Alcohol Use - no Daily Caffeine Use coffee at breakfast golf for exercise  Review of Systems       Denies fever, malais, weight loss, blurry vision, decreased visual acuity, cough, sputum, , hemoptysis, pleuritic pain, palpitaitons, heartburn, abdominal pain, melena, lower extremity edema, claudication, or rash.   Vital Signs:  Patient profile:   77 year old male Height:      71 inches Weight:      164 pounds BMI:     22.96 Pulse rate:   70 / minute Resp:     16 per minute BP sitting:   90 / 60  (left  arm)  Vitals Entered By: Kem Parkinson (September 18, 2009 10:01 AM)  Physical Exam  General:  Affect appropriate Healthy:  appears stated age HEENT: normal Neck supple with no adenopathy JVP normal no bruits no thyromegaly Lungs diffuse interstitial sounds at both bases  no wheezing and good diaphragmatic motion Heart:  S1/S2 systolic  murmur no ,rub, gallop or click PMI normal Abdomen: benighn, BS positve, no tenderness, no AAA no bruit.  No HSM or HJR Distal pulses intact with no bruits No edema Neuro non-focal Skin warm and dry    PPM Specifications Following MD:  Sherryl Manges, MD     Referring MD:  [facility] Vendor:  St Jude     PPM Model Number:  [id number]     PPM Serial Number:  [id number] PPM DOI:  05/08/2009     PPM Implanting MD:  NOT IMPLANTED HERE  Lead 1    Location: RA     DOI: 05/08/2009     Model #: [id number]     Serial #: [id number]  Status: active Lead 2    Location: RV     DOI: 05/08/2009     Model #: [id number]     Serial #: [id number]     Status: active   Indications:  AFIB/CHB   PPM Follow Up Pacer Dependent:  No      Episodes Coumadin:  No  Parameters Mode:  DDDR     Lower Rate Limit:  70     Upper Rate Limit:  120 Paced AV Delay:  200     Sensed AV Delay:  150  Impression & Recommendations:  Problem # 1:  COUMADIN THERAPY (ICD-V58.61) S/P DCC check INR today and in 4 weeks  Problem # 2:  ATRIAL FIBRILLATION, PAROXYSMAL (ICD-427.31) Maint NSR.  Check pacer in 8 weeks and if no mode switches D/C Amiodarone His updated medication list for this problem includes:    Aspirin 81 Mg Tbec (Aspirin) .Marland Kitchen... Take one by mouth once daily    Metoprolol Tartrate 50 Mg Tabs (Metoprolol tartrate) .Marland Kitchen... Take one tablet by mouth twice a day    Amiodarone Hcl 200 Mg Tabs (Amiodarone hcl) .Marland Kitchen... 1 tab by mouth once daily    Warfarin Sodium 2.5 Mg Tabs (Warfarin sodium) ..... Use as directed by anticoagualtion clinic  Problem # 3:  SHORTNESS OF BREATH (ICD-786.05) At this point  rhythm, CAD, Valve, volume are not issues and he has significant dyspnea I believe from interstitial fibrosis wit previous lung CA and right lobectomy .  Refer to pulmonary and check DlCO and PFT's  Will do right heart cath if requested by pulmonary His updated medication list for this problem includes:    Aspirin 81 Mg Tbec (Aspirin) .Marland Kitchen... Take one by mouth once daily    Furosemide 40 Mg Tabs (Furosemide) .Marland Kitchen... 1 tab by mouth by mouth daily    Metoprolol Tartrate 50 Mg Tabs (Metoprolol tartrate) .Marland Kitchen... Take one tablet by mouth twice a day  Orders: Pulmonary Referral (Pulmonary) Pulmonary Function Test (PFT)  Problem # 4:  CARDIAC PACEMAKER IN SITU (ICD-V45.01) Working normally with lower rate increased post cardioversion to encourage maint of NSR  Problem # 5:  AORTIC STENOSIS (ICD-424.1) S/P AVR tissue normal functioning by echo 4/11 His updated medication list for this problem includes:    Furosemide 40 Mg Tabs (Furosemide) .Marland Kitchen... 1 tab by mouth by mouth daily    Metoprolol Tartrate 50 Mg Tabs (Metoprolol tartrate) .Marland Kitchen... Take one tablet by mouth twice a day  Problem # 6:  HYPERCHOLESTEROLEMIA (ICD-272.0) Continue 2 drug Rx despite age he tolerates well His updated medication list for this problem includes:    Lipitor 10 Mg Tabs (Atorvastatin calcium) .Marland Kitchen... 1 tab op once daily    Lopid 600 Mg Tabs (Gemfibrozil) .Marland Kitchen... 1 tab by mouth once daily  Problem # 7:  Hx of CAD (ICD-414.00) S/P CABG with no chest pain or angina His updated medication list for this problem includes:    Aspirin 81 Mg Tbec (Aspirin) .Marland Kitchen... Take one by mouth once daily    Metoprolol Tartrate 50 Mg Tabs (Metoprolol tartrate) .Marland Kitchen... Take one tablet by mouth twice a day    Warfarin Sodium 2.5 Mg Tabs (Warfarin sodium) ..... Use as directed by anticoagualtion clinic  Patient Instructions: 1)  Your physician recommends that you schedule a follow-up appointment in: 8 WEEKS 2)  You have been referred to DR Delton Coombes IN  PULMONARY-SOB/ LUNG DISEASE 3)  Your physician has recommended that you have a pulmonary function test.  Pulmonary Function Tests  are a group of tests that measure how well air moves in and out of your lungs.   EKG Report  Procedure date:  09/18/2009  Findings:      NSR P synch pacing and AV pacing LBBB

## 2010-05-06 NOTE — Assessment & Plan Note (Signed)
Summary: per check out/sf   Visit Type:  Follow-up Referring Provider:  [name] Primary Provider:  [name]  CC:  SOB.  History of Present Illness: Adam [name] is seen today for F/U of his afib.  He is a complicated patient.  He was sent to [facility] 2 months ago to consider percutaneous AVR for severe AS.  He ended up with an open AVR/CABG complicated by CHB and afib.  He required a pacer.  HIs pacer indicates he has been in abib for about 7 weeks.  He has fairly profound dyspnea post op and appears to have pulmonary fibrosis complicating his COPD.  He has had PAF in the past and tolerates it poorly likely from diastolic dysfunction.  He was started on coumadin a couple of weeks ago.  We increased his beta blocker and he feels slightly better with improved rate control.  We stopped his amiodarone due to lung issues and I believe he has also had LFT abnormalities before.  We discussed [facility] once he has been therapeutic for 4 weeks and he and his daughter agree that this is indicated.  If he feels to convert we would consider Tikosyn Rx.  He denies SSCP, palpitations, edema or syncope.  He has not had a cough, sputum or hemoptysis.  His INR has been Rx the last 2 weeks.  I also discussed with him if his INR were difficult to keep Rx or he drops low I would like to admit him for TEE/DCC to expedite matters.    Current Problems (verified): 1)  Atrial Fibrillation, Paroxysmal  (ICD-427.31) 2)  Cardiac Pacemaker in Situ  (ICD-V45.01) 3)  Shortness of Breath  (ICD-786.05) 4)  Constipation  (ICD-564.00) 5)  Aortic Stenosis  (ICD-424.1) 6)  Premature Ventricular Contractions  (ICD-427.69) 7)  Syncope  (ICD-780.2) 8)  Hypercholesterolemia  (ICD-272.0) 9)  Arthritis  (ICD-716.90) 10)  Hx of Cad  (ICD-414.00) 11)  Hypertension  (ICD-401.9) 12)  Hyperlipidemia  (ICD-272.4) 13)  COPD  (ICD-496) 14)  Renal Insufficiency  (ICD-588.9) 15)  Carcinoma, Colon, Family Hx  (ICD-V16.0) 16)  Rectal Bleeding   (ICD-569.3) 17)  Change in Bowels  (ICD-787.99) 18)  Gout, Unspecified  (ICD-274.9) 19)  Hiatal Hernia With Reflux  (ICD-553.3) 20)  Gastritis  (ICD-535.50) 21)  Esophageal Stricture  (ICD-530.3) 22)  Hemorrhoids, External  (ICD-455.3) 23)  Gouty Arthropathy  (ICD-274.0) 24)  Hx of Benign Prostatic Hypertrophy, Hx of  (ICD-V13.8) 25)  Hx of Allergy  (ICD-995.3) 26)  Hx of Venous Insufficiency  (ICD-459.81) 27)  Erectile Dysfunction  (ICD-302.72) 28)  Osteoarthritis  (ICD-715.90) 29)  Gerd  (ICD-530.81) 30)  Diverticulosis, Colon  (ICD-562.10) 31)  Lung Cancer, Hx of  (ICD-V10.11)  Current Medications (verified): 1)  Tylenol 8 Hour 650 Mg Tbcr (Acetaminophen) .... Take 3 By Mouth Daily 2)  Prevacid 30 Mg Cpdr (Lansoprazole) .... Take 1 Tablet By Mouth Two Times A Day 3)  Aspirin 81 Mg Tbec (Aspirin) .... Take One By Mouth Once Daily 4)  Senokot 8.6 Mg Tabs (Sennosides) .... As Needed 5)  Colchicine 0.6 Mg Tabs (Colchicine) .... One Tablet By Mouth Twice Daily As Needed For Gout 6)  Furosemide 40 Mg Tabs (Furosemide) .Marland Kitchen.. 1 Tab By Mouth By Mouth Daily 7)  Metoprolol Tartrate 50 Mg Tabs (Metoprolol Tartrate) .... Take One Tablet By Mouth Twice A Day 8)  Lipitor 10 Mg Tabs (Atorvastatin Calcium) .Marland Kitchen.. 1 Tab Op Once Daily 9)  Lopid 600 Mg Tabs (Gemfibrozil) .Marland Kitchen.. 1 Tab By Mouth  Once Daily 10)  Amiodarone Hcl 200 Mg Tabs (Amiodarone Hcl) .Marland Kitchen.. 1 Tab By Mouth Once Daily 11)  Colace 100 Mg Caps (Docusate Sodium) .... As Needed 12)  Zoloft 25 Mg Tabs (Sertraline Hcl) .... 1/2  Tab By Mouth Once Daily 13)  Warfarin Sodium 2.5 Mg Tabs (Warfarin Sodium) .... Use As Directed By Anticoagualtion Clinic  Allergies (verified): 1)  ! Pcn  Past History:  Past Medical History: Last updated: 12/16/2008  Lung cancer, hx of  COPD  Diverticulosis, colon  GERD/ Stricture  Esophageal dysmotility  Hyperlipidemia  Hypertension Osteoarthritis  Padget's  aortic stenosis  mild renal insufficiency    erectile dysfunction venous insufficiency Gout BPH  Past Surgical History: Last updated: 07/01/2009 Aortic valve replacement with 25 mm CE bovine pericardial valve and cornary artery bypass graft x3 with left internal mammary artery to left anterior descending, saphenous vein graft to obutse marginal 2 and saphenous vein graft to posterior desending cornary arteries indivually on 05/04/09 Status post implantation of dual chamber pacemaker, [employer] on 05/08/09 Cholecystectomy Tonsillectomy Lung-lobectomy (1991) Prostate surgery (1990) Left thumb surgery Vein stripping Basal cell surgery- nose Stress test- pos.  AS on Echo, LVH (09/2001) EGD- stricture (07/207) Colonoscopy- diverticulosis, small polyp, hemorrhoids (03/2001) ,  diverticulosis, hemorrhoids (01/2003) Cardiolite- neg., mild-mod AS (11/2003) Carotids- neg (11/2005) EGD- gastritis, stricture, dysmotility (07/2004) Rib films- right 10th rib fracture (08/2005) Echo- stable (09/2005) 3/09 2D echo- mod aortic stenosis with EF 55%  Family History: Last updated: 12/16/2008 Father with CAD @ 54 mother with pancreatic ca sister with ?abdominal ca Colon Cancer: brother age 74- died   Social History: Last updated: 11/07/2008 quit smoking 1991 Patient is a former smoker.  Alcohol Use - no Daily Caffeine Use coffee at breakfast golf for exercise  Review of Systems       Denies fever, malais, weight loss, blurry vision, decreased visual acuity, cough, sputum, hemoptysis, pleuritic pain, palpitaitons, heartburn, abdominal pain, melena, lower extremity edema, claudication, or rash.   Vital Signs:  Patient profile:   77 year old male Height:      71 inches Weight:      168 pounds BMI:     23.52 Pulse rate:   65 / minute Pulse rhythm:   irregularly irregular BP sitting:   112 / 68  (left arm) Cuff size:   regular  Vitals Entered By: Hardin Negus, RMA (Aug 14, 2009 9:25 AM)  Physical Exam  General:  Affect  appropriate Healthy:  appears stated age HEENT: normal Neck supple with no adenopathy JVP normal no bruits no thyromegaly Lungs diffuse interstial crackles  no wheezing and good diaphragmatic motion Heart:  S1/S2 systolic  murmur,rub, gallop or click PMI normal Abdomen: benighn, BS positve, no tenderness, no AAA no bruit.  No HSM or HJR Distal pulses intact with no bruits No edema Neuro non-focal Skin warm and dry    PPM Specifications Following MD:  Sherryl Manges, MD     Referring MD:  [facility] Vendor:  [employer]     PPM Model Number:  [id number]     PPM Serial Number:  [id number] PPM DOI:  05/08/2009     PPM Implanting MD:  NOT IMPLANTED HERE  Lead 1    Location: RA     DOI: 05/08/2009     Model #: [id number]     Serial #: [id number]     Status: active Lead 2    Location: RV     DOI: 05/08/2009  Model #: [id number]     Serial #: [id number]     Status: active   Indications:  AFIB/CHB   PPM Follow Up Pacer Dependent:  No      Episodes Coumadin:  No  Parameters Mode:  DDDR     Lower Rate Limit:  70     Upper Rate Limit:  120 Paced AV Delay:  200     Sensed AV Delay:  150  Impression & Recommendations:  Problem # 1:  ATRIAL FIBRILLATION, PAROXYSMAL (ICD-427.31) Better rate control.  DCC in 3 weeks.  Risks discussed and patient /daughter willing to proceed.  See SK note.  Consider D/C Amiodarone and certainly not increase it pending success or failure of DCC.  Tikosyn an option His updated medication list for this problem includes:    Aspirin 81 Mg Tbec (Aspirin) .Marland Kitchen... Take one by mouth once daily    Metoprolol Tartrate 50 Mg Tabs (Metoprolol tartrate) .Marland Kitchen... Take one tablet by mouth twice a day    Amiodarone Hcl 200 Mg Tabs (Amiodarone hcl) .Marland Kitchen... 1 tab by mouth once daily    Warfarin Sodium 2.5 Mg Tabs (Warfarin sodium) ..... Use as directed by anticoagualtion clinic  Problem # 2:  CARDIAC PACEMAKER IN SITU (ICD-V45.01) Will need medtronic for [facility].  Normal functoining back up  paceing  Problem # 3:  SHORTNESS OF BREATH (ICD-786.05) More lung related but will continue diuretic and attemp [facility] to help AV synchrony.  Dyspnea improved with better rate control His updated medication list for this problem includes:    Aspirin 81 Mg Tbec (Aspirin) .Marland Kitchen... Take one by mouth once daily    Furosemide 40 Mg Tabs (Furosemide) .Marland Kitchen... 1 tab by mouth by mouth daily    Metoprolol Tartrate 50 Mg Tabs (Metoprolol tartrate) .Marland Kitchen... Take one tablet by mouth twice a day  Problem # 4:  AORTIC STENOSIS (ICD-424.1) S/P tissue AVR  Normal gradient and functoining on post op echo His updated medication list for this problem includes:    Furosemide 40 Mg Tabs (Furosemide) .Marland Kitchen... 1 tab by mouth by mouth daily    Metoprolol Tartrate 50 Mg Tabs (Metoprolol tartrate) .Marland Kitchen... Take one tablet by mouth twice a day  Problem # 5:  HYPERCHOLESTEROLEMIA (ICD-272.0) Continue dual drug Rx His updated medication list for this problem includes:    Lipitor 10 Mg Tabs (Atorvastatin calcium) .Marland Kitchen... 1 tab op once daily    Lopid 600 Mg Tabs (Gemfibrozil) .Marland Kitchen... 1 tab by mouth once daily  Problem # 6:  COUMADIN THERAPY (ICD-V58.61) F/U coumadin clinic weekly prior to [facility].  No bleeding problems at this time  Patient Instructions: 1)  Your physician recommends that you schedule a follow-up appointment in: 2 WEEKS  WITH DR Eden Emms 2)  Your physician recommends that you continue on your current medications as directed. Please refer to the Current Medication list given to you today.

## 2010-05-06 NOTE — Medication Information (Signed)
Summary: rov/sp  Anticoagulant Therapy  Managed by: Weston Brass, PharmD PCP: [name] Supervising MD: Myrtis Ser MD, Tinnie Gens Indication 1: Atrial Fibrillation Lab Used: [facility] Point of Care [place] Site: [address] INR POC 2.3 INR RANGE 2-3  Dietary changes: no    Health status changes: no    Bleeding/hemorrhagic complications: no    Recent/future hospitalizations: no    Any changes in medication regimen? no    Recent/future dental: no  Any missed doses?: no       Is patient compliant with meds? yes       Allergies: 1)  ! Pcn  Anticoagulation Management History:      The patient is taking warfarin and comes in today for a routine follow up visit.  Positive risk factors for bleeding include an age of 46 years or older and presence of serious comorbidities.  The bleeding index is 'intermediate risk'.  Positive CHADS2 values include History of HTN and Age > 50 years old.  Anticoagulation responsible provider: Myrtis Ser MD, Tinnie Gens.  INR POC: 2.3.  Cuvette Lot#: [id number].  Exp: 01/2011.    Anticoagulation Management Assessment/Plan:      The patient's current anticoagulation dose is Warfarin sodium 2.5 mg tabs: Use as directed by Anticoagualtion Clinic.  The target INR is 2.0-3.0.  The next INR is due 11/16/2009.  Anticoagulation instructions were given to patient/spouse.  Results were reviewed/authorized by Weston Brass, PharmD.  He was notified by Dillard Cannon.         Prior Anticoagulation Instructions: INR 1.8  Take an extra 1/2 tablet today then resume same dose of 1 tablet every day except 1/2 tablet on Sunday.    Current Anticoagulation Instructions: INR 2.3  Continue same dose of 1 tab daily except for 1/2 tab on Sunday.  Re-check on 8/15.

## 2010-05-06 NOTE — Assessment & Plan Note (Signed)
Summary: F3M/DM   Referring Latrica Clowers:  Dr. Eden Emms Primary Rafay Dahan:  [name]  CC:  sob.  History of Present Illness: Adam [name] is seen today for F/U of dyspnea, afib, AVR, CAD.  We cardioverted him in June and he is maintaining NSR.  He has a history of diastolic dysfunction and intolerance to afib but unfortunately his dyspnea is minimally improved.  He has good LV function and a normal functioning AVR.  He has been on an adequate dose of Lasix for mildly elevated BNP with higher doses causing prerenal azotemia.   He is off amiodarone at this time.Marland Kitchen  His CXR shows diffuse interstitial lung disease and he had pre-existing COPD before his CABG/AVR at [facility] in March.  He finished cardiac rehab at [facility] and he may be a candidate for pulmonary rehab.  He had oxygen upon D/C from [facility] but doesn't think he needs it now.  He will get his INR checked today and again in 4 weeks.   He had a nice trip to [place] and actually fished and caught some flounder.  At this point I think it is clear that his dyspnea is more related to his lung disease   Recently seen by Dr Delton Coombes and started on symbicort.   Current Problems (verified): 1)  Allergic Rhinitis  (ICD-477.9) 2)  Interstitial Lung Disease  (ICD-515) 3)  Encounter For Long-term Use of Other Medications  (ICD-V58.69) 4)  Coumadin Therapy  (ICD-V58.61) 5)  Atrial Fibrillation, Paroxysmal  (ICD-427.31) 6)  Cardiac Pacemaker in Situ  (ICD-V45.01) 7)  Shortness of Breath  (ICD-786.05) 8)  Constipation  (ICD-564.00) 9)  Aortic Stenosis  (ICD-424.1) 10)  Premature Ventricular Contractions  (ICD-427.69) 11)  Syncope  (ICD-780.2) 12)  Hypercholesterolemia  (ICD-272.0) 13)  Arthritis  (ICD-716.90) 14)  Hx of Cad  (ICD-414.00) 15)  Hypertension  (ICD-401.9) 16)  Hyperlipidemia  (ICD-272.4) 17)  COPD  (ICD-496) 18)  Renal Insufficiency  (ICD-588.9) 19)  Carcinoma, Colon, Family Hx  (ICD-V16.0) 20)  Rectal Bleeding  (ICD-569.3) 21)  Change in Bowels   (ICD-787.99) 22)  Gout, Unspecified  (ICD-274.9) 23)  Hiatal Hernia With Reflux  (ICD-553.3) 24)  Gastritis  (ICD-535.50) 25)  Esophageal Stricture  (ICD-530.3) 26)  Hemorrhoids, External  (ICD-455.3) 27)  Gouty Arthropathy  (ICD-274.0) 28)  Hx of Benign Prostatic Hypertrophy, Hx of  (ICD-V13.8) 29)  Hx of Allergy  (ICD-995.3) 30)  Hx of Venous Insufficiency  (ICD-459.81) 31)  Erectile Dysfunction  (ICD-302.72) 32)  Osteoarthritis  (ICD-715.90) 33)  Gerd  (ICD-530.81) 34)  Diverticulosis, Colon  (ICD-562.10) 35)  Lung Cancer, Hx of  (ICD-V10.11)  Current Medications (verified): 1)  Tylenol 8 Hour 650 Mg Tbcr (Acetaminophen) .... Take 3 By Mouth Daily As Needed 2)  Prevacid 30 Mg Cpdr (Lansoprazole) .... Take 1 Tablet By Mouth Two Times A Day 3)  Aspirin 81 Mg Tbec (Aspirin) .... Take One By Mouth Once Daily 4)  Senokot 8.6 Mg Tabs (Sennosides) .... As Needed 5)  Colchicine 0.6 Mg Tabs (Colchicine) .... One Tablet By Mouth Twice Daily As Needed For Gout 6)  Furosemide 40 Mg Tabs (Furosemide) .Marland Kitchen.. 1 Tab By Mouth By Mouth Daily 7)  Metoprolol Tartrate 50 Mg Tabs (Metoprolol Tartrate) .... Take One Tablet By Mouth Twice A Day 8)  Lipitor 10 Mg Tabs (Atorvastatin Calcium) .Marland Kitchen.. 1 Tab Op Once Daily 9)  Lopid 600 Mg Tabs (Gemfibrozil) .Marland Kitchen.. 1 Tab By Mouth Once Daily 10)  Amiodarone Hcl 200 Mg Tabs (Amiodarone Hcl) .Marland Kitchen.. 1 Tab  By Mouth Once Daily 11)  Colace 100 Mg Caps (Docusate Sodium) .... As Needed 12)  Zoloft 25 Mg Tabs (Sertraline Hcl) .Marland Kitchen.. 1 By Mouth Daily 13)  Warfarin Sodium 2.5 Mg Tabs (Warfarin Sodium) .... Use As Directed By Anticoagualtion Clinic 14)  Meloxicam  (Meloxicam) .... 3 or 4 Times Weekly 15)  Xopenex Hfa 45 Mcg/act Aero (Levalbuterol Tartrate) .... 2 Puffs Up To Four Times Daily As Needed  Allergies (verified): 1)  ! Pcn  Past History:  Past Medical History: Last updated: 12/16/2008  Lung cancer, hx of  COPD  Diverticulosis, colon  GERD/ Stricture  Esophageal  dysmotility  Hyperlipidemia  Hypertension Osteoarthritis  Padget's  aortic stenosis  mild renal insufficiency  erectile dysfunction venous insufficiency Gout BPH  Past Surgical History: Last updated: 07/01/2009 Aortic valve replacement with 25 mm CE bovine pericardial valve and cornary artery bypass graft x3 with left internal mammary artery to left anterior descending, saphenous vein graft to obutse marginal 2 and saphenous vein graft to posterior desending cornary arteries indivually on 05/04/09 Status post implantation of dual chamber pacemaker, [employer] on 05/08/09 Cholecystectomy Tonsillectomy Lung-lobectomy (1991) Prostate surgery (1990) Left thumb surgery Vein stripping Basal cell surgery- nose Stress test- pos.  AS on Echo, LVH (09/2001) EGD- stricture (07/207) Colonoscopy- diverticulosis, small polyp, hemorrhoids (03/2001) ,  diverticulosis, hemorrhoids (01/2003) Cardiolite- neg., mild-mod AS (11/2003) Carotids- neg (11/2005) EGD- gastritis, stricture, dysmotility (07/2004) Rib films- right 10th rib fracture (08/2005) Echo- stable (09/2005) 3/09 2D echo- mod aortic stenosis with EF 55%  Family History: Last updated: 10/16/2009 Father with CAD @ 25 mother with pancreatic ca sister with ?abdominal ca Colon Cancer: brother age 48- died  Heart disease---MGM  Social History: Last updated: 10/16/2009 quit smoking 1991 Patient is a former smoker.  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   Review of Systems       Denies fever, malais, weight loss, blurry vision, decreased visual acuity, cough, sputum, , hemoptysis, pleuritic pain, palpitaitons, heartburn, abdominal pain, melena, lower extremity edema, claudication, or rash.   Vital Signs:  Patient profile:   77 year old male Height:      71 inches Weight:      173 pounds BMI:     24.22 Pulse  rate:   78 / minute Resp:     14 per minute BP sitting:   115 / 70  (left arm)  Vitals Entered By: Kem Parkinson (February 10, 2010 4:04 PM)  Physical Exam  General:  Affect appropriate Healthy:  appears stated age HEENT: normal Neck supple with no adenopathy JVP normal no bruits no thyromegaly Lungs clear with no wheezing and good diaphragmatic motion Heart:  S1/S2 systolic murmur through tissue valve with no AR and no ,rub, gallop or click PMI normal Abdomen: benighn, BS positve, no tenderness, no AAA no bruit.  No HSM or HJR Distal pulses intact with no bruits Plus one bilateral  edema with marked varicosities Neuro non-focal Skin warm and dry    PPM Specifications Following MD:  Sherryl Manges, MD     Referring MD:  [facility] Vendor:  [employer]     PPM Model Number:  [id number]     PPM Serial Number:  [id number] DOI:  05/08/2009     PPM Implanting MD:  NOT IMPLANTED HERE  Lead 1    Location: RA     DOI: 05/08/2009  Model #: [id number]     Serial #: [id number]     Status: active Lead 2    Location: RV     DOI: 05/08/2009     Model #: [id number]     Serial #: [id number]     Status: active   Indications:  AFIB/CHB   PPM Follow Up Pacer Dependent:  No      Episodes Coumadin:  No  Parameters Mode:  DDDR     Lower Rate Limit:  70     Upper Rate Limit:  120 Paced AV Delay:  200     Sensed AV Delay:  150  Impression & Recommendations:  Problem # 1:  INTERSTITIAL LUNG DISEASE (ICD-515) F/U Dr Delton Coombes  Seems to have some improvement with symbicort.  DLCO per amiodarone guidlines.  He is very symptomatic when in afib and I dont think there is a lot of danger in maint 200mg  of amiodarone/day  Problem # 2:  ATRIAL FIBRILLATION, PAROXYSMAL (ICD-427.31) s/P DCC on low dose amiodarone.  LFT's and TSH/T4 today.  DLCO per pulmonary His updated medication list for this problem includes:    Aspirin 81 Mg Tbec (Aspirin) .Marland Kitchen... Take one by mouth once daily    Metoprolol Tartrate 50 Mg Tabs  (Metoprolol tartrate) .Marland Kitchen... Take one tablet by mouth twice a day    Amiodarone Hcl 200 Mg Tabs (Amiodarone hcl) .Marland Kitchen... 1 tab by mouth once daily    Warfarin Sodium 2.5 Mg Tabs (Warfarin sodium) ..... Use as directed by anticoagualtion clinic  Orders: TLB-TSH (Thyroid Stimulating Hormone) (84443-TSH) TLB-T4 (Thyrox), Free [phone])  Problem # 3:  AORTIC STENOSIS (ICD-424.1) S/P tissue AVR with normal EF Continsu SBE prophylaxis His updated medication list for this problem includes:    Furosemide 40 Mg Tabs (Furosemide) .Marland Kitchen... 1 tab by mouth by mouth daily    Metoprolol Tartrate 50 Mg Tabs (Metoprolol tartrate) .Marland Kitchen... Take one tablet by mouth twice a day  Problem # 4:  HYPERCHOLESTEROLEMIA (ICD-272.0) At goal with no side effects His updated medication list for this problem includes:    Lipitor 10 Mg Tabs (Atorvastatin calcium) .Marland Kitchen... 1 tab op once daily    Lopid 600 Mg Tabs (Gemfibrozil) .Marland Kitchen... 1 tab by mouth once daily  Orders: TLB-Hepatic/Liver Function Pnl (80076-HEPATIC)  CHOL: 199 (10/28/2008)   LDL: DEL (01/09/2008)   HDL: 38.10 (10/28/2008)   TG: 300.0 (10/28/2008)  Problem # 5:  COUMADIN THERAPY (ICD-V58.61) F/U clinic INR's Rx No bleeding issues  Problem # 6:  HYPERTENSION (ICD-401.9) Well controlled His updated medication list for this problem includes:    Aspirin 81 Mg Tbec (Aspirin) .Marland Kitchen... Take one by mouth once daily    Furosemide 40 Mg Tabs (Furosemide) .Marland Kitchen... 1 tab by mouth by mouth daily    Metoprolol Tartrate 50 Mg Tabs (Metoprolol tartrate) .Marland Kitchen... Take one tablet by mouth twice a day  Patient Instructions: 1)  Your physician recommends that you schedule a follow-up appointment in: 3 MONTHS

## 2010-05-06 NOTE — Medication Information (Signed)
Summary: rov/sl  Anticoagulant Therapy  Managed by: Reina Fuse, PharmD PCP: [name] Supervising MD: Eden Emms MD, Theron Arista Indication 1: Atrial Fibrillation Lab Used: [facility] Point of Care [place] Site: [address] INR POC 1.9 INR RANGE 2-3  Dietary changes: no    Health status changes: no    Bleeding/hemorrhagic complications: no    Recent/future hospitalizations: no    Any changes in medication regimen? no    Recent/future dental: no  Any missed doses?: no       Is patient compliant with meds? yes       Allergies: 1)  ! Pcn  Anticoagulation Management History:      The patient is taking warfarin and comes in today for a routine follow up visit.  Positive risk factors for bleeding include an age of 75 years or older and presence of serious comorbidities.  The bleeding index is 'intermediate risk'.  Positive CHADS2 values include History of HTN and Age > 73 years old.  His last INR was 2.5.  Anticoagulation responsible provider: Eden Emms MD, Theron Arista.  INR POC: 1.9.  Cuvette Lot#: [id number].  Exp: 02/2011.    Anticoagulation Management Assessment/Plan:      The patient's current anticoagulation dose is Warfarin sodium 2.5 mg tabs: Use as directed by Anticoagualtion Clinic.  The target INR is 2.0-3.0.  The next INR is due 03/02/2010.  Anticoagulation instructions were given to patient/spouse.  Results were reviewed/authorized by Reina Fuse, PharmD.  He was notified by Reina Fuse PharmD.         Prior Anticoagulation Instructions: INR 1.7  Tomorrow, Saturday, October 22nd, take Coumadin 2 tabs (5 mg). Then, start taking Coumadin 1 tab (2.5 mg) on all days except Coumadin 1.5 tabs (3.75 mg) on Wednesdays. Return to clinic on 11/9 to coincide with doctor's appointment.   Current Anticoagulation Instructions: INR 1.9  Tomorrow, November 10th,  take Coumadin 1.5 tab (3.75 mg).  Then, resume taking Coumadin 1 tab (2.5 mg) all days except for Coumadin 1.5 tabs (3.75 mg) on  Wednesdays. Return to clinic in 2 weeks.

## 2010-05-06 NOTE — Progress Notes (Signed)
Summary: Sertraline 25mg  and Gemfiborzil 600mg   Phone Note Refill Request Call back at [phone] Message from:  [facility] on December 03, 2009 12:48 PM  Refills Requested: Medication #1:  LOPID 600 MG TABS 1 tab by mouth once daily  Medication #2:  ZOLOFT 25 MG TABS 1/2  tab by mouth once daily [facility] electronically requested refills on Gemfibrozil 600mg  and Sertraline 25mg  taking 1 tab daily and our med list said 1/2 tab daily. Please advise.    Method Requested: Telephone to Pharmacy Initial call taken by: Lewanda Rife LPN,  December 03, 2009 12:52 PM  Follow-up for Phone Call        I think the zoloft may be new since his last visit with me a year ago? but I could be wrong  - please clarify dosing with him and what he is taking it for schedule fastinglab and f/u this fall lipid/ast/alt/renal / cbc with diff/ tsh 401.1, 272  px written on EMR for call in  Follow-up by: Judith Part MD,  December 03, 2009 1:01 PM  Additional Follow-up for Phone Call Additional follow up Details #1::        Left message for patient's daughter June to call back. Lewanda Rife LPN  December 03, 2009 1:14 PM   Patient's wife notified as instructed by telephone. Lopid 600mg  phoned to pharmacy as instructed.  Pt's wife said pt had been put on Zoloft 25mg  taking 1/2 tab by mouth daily after his surgery to keep down his anxiety level. Pt has enough Zoloft that refill can be called in 12/04/09. Mrs. Nghiem will call back to schedule lab and f/u appt with Dr Milinda Antis for this fall.Please advise. Lewanda Rife LPN  December 03, 2009 3:32 PM     Additional Follow-up for Phone Call Additional follow up Details #2::    can do the zoloft now Follow-up by: Judith Part MD,  December 04, 2009 8:00 AM  Additional Follow-up for Phone Call Additional follow up Details #3:: Details for Additional Follow-up Action Taken: [facility]. Additional Follow-up by: Lowella Petties CMA,  December 04, 2009 9:02 AM  Prescriptions: ZOLOFT 25 MG TABS (SERTRALINE HCL) 1/2  tab by mouth once daily  #15 x 11   Entered and Authorized by:   Judith Part MD   Signed by:   Lowella Petties CMA on 12/04/2009   Method used:   Telephoned to ...       [facility] (retail)       [address]       [place], [place]  [address]       Ph: [phone] or [phone]       Fax: [phone]   RxID:   [id number] LOPID 600 MG TABS (GEMFIBROZIL) 1 tab by mouth once daily  #30 x 2   Entered and Authorized by:   Judith Part MD   Signed by:   Lewanda Rife LPN on 44/06/4740   Method used:   Telephoned to ...       [facility] (retail)       [address]       [place], [place]  [address]       Ph: [phone] or [phone]       Fax: [phone]   RxID:   [id number]

## 2010-05-06 NOTE — Assessment & Plan Note (Signed)
Summary: COPD, allergic rhinitis   Visit Type:  Follow-up Copy to:  Dr. Eden Emms Primary Provider/Referring Provider:  [name]  CC:  COPD.  The patient says his sob is slightly worse with exertion. The patient stopped Spiriva after 3 days due to dizziness.Marland Kitchen  History of Present Illness: 77 yo man, former smoker and hx of asbestos exposure, Hx of lung CA s/p LLL lobectomy in 1991, CAD and AV disease s/p CABG/AVR in 04/2009. He also has Afib s/p pacer and successful cardioversion on June 3. He has been on home O2 since his SGY, imaging has shown ? ILD. He also had COPD documented prior to SGY. New PFT's were done today as below. Not currently on BD's.   ROS: he has exertional SOB when he walks through the house. He has some UA congestion and needs to throat clear. Constant clear nasal drainage.   ROV 11/26/09 -- returns for f/u of multifactorial SOB, with COPD and restriction post lobectomy as contributors. Since last visit has had CT scan of the chest - no ILD, some pleural thickening, some emphysematous changes. We did trial of Spiriva, only took it for 3 days due to dizziness. He thinks his breathing did benefit some while he was on the med. Has been doing IS more frequently.   Preventive Screening-Counseling & Management  Alcohol-Tobacco     Smoking Status: quit     Packs/Day: 0.75     Year Started: 1938     Year Quit: 1991     Pack years: 38.25  Current Medications (verified): 1)  Tylenol 8 Hour 650 Mg Tbcr (Acetaminophen) .... Take 3 By Mouth Daily 2)  Prevacid 30 Mg Cpdr (Lansoprazole) .... Take 1 Tablet By Mouth Two Times A Day 3)  Aspirin 81 Mg Tbec (Aspirin) .... Take One By Mouth Once Daily 4)  Senokot 8.6 Mg Tabs (Sennosides) .... As Needed 5)  Colchicine 0.6 Mg Tabs (Colchicine) .... One Tablet By Mouth Twice Daily As Needed For Gout 6)  Furosemide 40 Mg Tabs (Furosemide) .Marland Kitchen.. 1 Tab By Mouth By Mouth Daily 7)  Metoprolol Tartrate 50 Mg Tabs (Metoprolol Tartrate) .... Take  One Tablet By Mouth Twice A Day 8)  Lipitor 10 Mg Tabs (Atorvastatin Calcium) .Marland Kitchen.. 1 Tab Op Once Daily 9)  Lopid 600 Mg Tabs (Gemfibrozil) .Marland Kitchen.. 1 Tab By Mouth Once Daily 10)  Amiodarone Hcl 200 Mg Tabs (Amiodarone Hcl) .Marland Kitchen.. 1 Tab By Mouth Once Daily 11)  Colace 100 Mg Caps (Docusate Sodium) .... As Needed 12)  Zoloft 25 Mg Tabs (Sertraline Hcl) .... 1/2  Tab By Mouth Once Daily 13)  Warfarin Sodium 2.5 Mg Tabs (Warfarin Sodium) .... Use As Directed By Anticoagualtion Clinic 14)  Meloxicam  (Meloxicam) .... 3 or 4 Times Weekly 15)  Benadryl 25 Mg Tabs (Diphenhydramine Hcl) .Marland Kitchen.. 1 Tab By Mouth Once Daily 16)  Xopenex Hfa 45 Mcg/act Aero (Levalbuterol Tartrate) .... 2 Puffs Up To Four Times Daily As Needed  Allergies (verified): 1)  ! Pcn  Vital Signs:  Patient profile:   77 year old male Height:      71 inches (180.34 cm) Weight:      161.25 pounds (73.30 kg) BMI:     22.57 O2 Sat:      92 % on Room air Temp:     97.6 degrees F (36.44 degrees C) oral Pulse rate:   82 / minute BP sitting:   100 / 62  (left arm) Cuff size:   regular  Vitals  Entered By: Michel Bickers CMA (November 27, 2009 11:31 AM)  O2 Sat at Rest %:  92 O2 Flow:  Room air CC: COPD.  The patient says his sob is slightly worse with exertion. The patient stopped Spiriva after 3 days due to dizziness. Comments Medications reviewed. Daytime phone verified. Michel Bickers [facility]  November 27, 2009 11:32 AM   Physical Exam  General:  normal appearance and healthy appearing.   Head:  normocephalic and atraumatic Eyes:  conjunctiva and sclera clear Nose:  no deformity, discharge, inflammation, or lesions Mouth:  no deformity or lesions Neck:  no masses, thyromegaly, or abnormal cervical nodes Chest Wall:  L superclav pacer Lungs:  good air movement, no crackles or wheezes Heart:  regular, 3/6 M Abdomen:  not examined Msk:  no deformity or scoliosis noted with normal posture Extremities:  no edema Neurologic:   non-focal Skin:  intact without lesions or rashes Psych:  alert and cooperative; normal mood and affect; normal attention span and concentration   Impression & Recommendations:  Problem # 1:  COPD (ICD-496) Wants to retry Spiriva to see if he can tolerate.  - 10 days Spiriva, he will call if he wants to continue it - if not tolerated then trial LABA/ICS - ROV 2 months - pulm rehab in future once we sort out his inhaled regimen  Problem # 2:  INTERSTITIAL LUNG DISEASE (ICD-515) Some pleural disease, but no eveidence ILD on CT scan. No pleural plaquing noted (he does have asbestos exposure)  Problem # 3:  ALLERGIC RHINITIS (ICD-477.9)  - add loratadine to benadryl  Orders: Est. Patient Level IV ([address])  Problem # 4:  LUNG CANCER, HX OF (ICD-V10.11) - S/p LLL lobectomy  Medications Added to Medication List This Visit: 1)  Xopenex Hfa 45 Mcg/act Aero (Levalbuterol tartrate) .... 2 puffs up to four times daily as needed  Patient Instructions: 1)  We will retry Spiriva once daily to see if it is better tolerated.  2)  Please call our office if you have side effects or after 10 days so we can decide whether to call this in to your pharmacy.  3)  Keep xopenex available to use as needed.  4)  We will refer you to pulmonary rehab once we have your inhaled medication regimen determined.  5)  Start loratadine (Claritin) 10mg  by mouth every morning 6)  Continue your benadryl at bedtime  7)  Follow up with Dr Delton Coombes in 2 months or as needed

## 2010-05-06 NOTE — Progress Notes (Signed)
Summary: PT NOT FEELING WELL, SICK ON STOMACH  Phone Note Call from Patient Call back at [phone]   Caller: Daughter/JUNE Summary of Call: PT NOT FEELING WELL,SICK ON THE STOMACH Initial call taken by: Judie Grieve,  April 17, 2009 11:09 AM  Follow-up for Phone Call        I spoke with the pt's daughter and she wanted to know if the pt feeling sick on his stomach had anything to do with his aortic stenosis. I made her aware that this symptom is not normally related to AS.  The pt did have and episode of nausea and vomiting 1 week ago and has had nausea since then.  The pt has not been running a fever.  I told June to contact Dr Marvell Fuller office for further instructions on the pt's nausea. Follow-up by: Julieta Gutting, RN, BSN,  April 17, 2009 11:40 AM

## 2010-05-06 NOTE — Assessment & Plan Note (Signed)
Summary: dyspnea, COPD, ILD   Visit Type:  Initial Consult Copy to:  Dr. Eden Emms Primary Provider/Referring Provider:  [name]  CC:  Pulmonary consult for ILD/SOB. The patient c/o increased sob x1 year with exertion. Recent PFT's done @ [facility].Marland Kitchen  History of Present Illness: 77 yo man, former smoker and hx of asbestos exposure, Hx of lung CA s/p LLL lobectomy in 1991, CAD and AV disease s/p CABG/AVR in 04/2009. He also has Afib s/p pacer and successful cardioversion on June 3. He has been on home O2 since his SGY, imaging has shown ILD. He also had COPD documented prior to SGY. New PFT's were done today as below. Not currently on BD's.   ROS: he has exertional SOB when he walks through the house. He has some UA congestion and needs to throat clear. Constant clear nasal drainage.   Preventive Screening-Counseling & Management  Alcohol-Tobacco     Smoking Status: quit     Packs/Day: 0.75     Year Started: 1938     Year Quit: 1991     Pack years: 38.25  Current Medications (verified): 1)  Tylenol 8 Hour 650 Mg Tbcr (Acetaminophen) .... Take 3 By Mouth Daily 2)  Prevacid 30 Mg Cpdr (Lansoprazole) .... Take 1 Tablet By Mouth Two Times A Day 3)  Aspirin 81 Mg Tbec (Aspirin) .... Take One By Mouth Once Daily 4)  Senokot 8.6 Mg Tabs (Sennosides) .... As Needed 5)  Colchicine 0.6 Mg Tabs (Colchicine) .... One Tablet By Mouth Twice Daily As Needed For Gout 6)  Furosemide 40 Mg Tabs (Furosemide) .Marland Kitchen.. 1 Tab By Mouth By Mouth Daily 7)  Metoprolol Tartrate 50 Mg Tabs (Metoprolol Tartrate) .... Take One Tablet By Mouth Twice A Day 8)  Lipitor 10 Mg Tabs (Atorvastatin Calcium) .Marland Kitchen.. 1 Tab Op Once Daily 9)  Lopid 600 Mg Tabs (Gemfibrozil) .Marland Kitchen.. 1 Tab By Mouth Once Daily 10)  Amiodarone Hcl 200 Mg Tabs (Amiodarone Hcl) .Marland Kitchen.. 1 Tab By Mouth Once Daily 11)  Colace 100 Mg Caps (Docusate Sodium) .... As Needed 12)  Zoloft 25 Mg Tabs (Sertraline Hcl) .... 1/2  Tab By Mouth Once Daily 13)  Warfarin Sodium  2.5 Mg Tabs (Warfarin Sodium) .... Use As Directed By Anticoagualtion Clinic 14)  Meloxicam  (Meloxicam) .... 3 or 4 Times Weekly 15)  Benadryl 25 Mg Tabs (Diphenhydramine Hcl) .Marland Kitchen.. 1 Tab By Mouth Once Daily  Allergies (verified): 1)  ! Pcn  Past History:  Past Medical History: Reviewed history from 12/16/2008 and no changes required.  Lung cancer, hx of  COPD  Diverticulosis, colon  GERD/ Stricture  Esophageal dysmotility  Hyperlipidemia  Hypertension Osteoarthritis  Padget's  aortic stenosis  mild renal insufficiency  erectile dysfunction venous insufficiency Gout BPH  Past Surgical History: Reviewed history from 07/01/2009 and no changes required. Aortic valve replacement with 25 mm CE bovine pericardial valve and cornary artery bypass graft x3 with left internal mammary artery to left anterior descending, saphenous vein graft to obutse marginal 2 and saphenous vein graft to posterior desending cornary arteries indivually on 05/04/09 Status post implantation of dual chamber pacemaker, [employer] on 05/08/09 Cholecystectomy Tonsillectomy Lung-lobectomy (1991) Prostate surgery (1990) Left thumb surgery Vein stripping Basal cell surgery- nose Stress test- pos.  AS on Echo, LVH (09/2001) EGD- stricture (07/207) Colonoscopy- diverticulosis, small polyp, hemorrhoids (03/2001) ,  diverticulosis, hemorrhoids (01/2003) Cardiolite- neg., mild-mod AS (11/2003) Carotids- neg (11/2005) EGD- gastritis, stricture, dysmotility (07/2004) Rib films- right 10th rib fracture (08/2005) Echo-  stable (09/2005) 3/09 2D echo- mod aortic stenosis with EF 55%  Family History: Father with CAD @ 75 mother with pancreatic ca sister with ?abdominal ca Colon Cancer: brother age 60- died  Heart disease---MGM  Social History: quit smoking 1991 Patient is a former smoker.  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 Packs/Day:  0.75 Pack years:  38.25  Review of Systems       The patient complains of shortness of breath with activity, shortness of breath at rest, non-productive cough, irregular heartbeats, weight change, difficulty swallowing, and joint stiffness or pain.  The patient denies productive cough, coughing up blood, chest pain, acid heartburn, indigestion, loss of appetite, abdominal pain, sore throat, tooth/dental problems, headaches, nasal congestion/difficulty breathing through nose, sneezing, itching, ear ache, anxiety, depression, hand/feet swelling, rash, change in color of mucus, and fever.    Vital Signs:  Patient profile:   77 year old male Height:      71 inches (180.34 cm) Weight:      165 pounds (75 kg) BMI:     23.10 O2 Sat:      93 % on Room air Temp:     97.4 degrees F (36.33 degrees C) oral Pulse rate:   70 / minute BP sitting:   100 / 60  (right arm) Cuff size:   regular  Vitals Entered By: Michel Bickers CMA (October 16, 2009 1:37 PM)  O2 Sat at Rest %:  93 O2 Flow:  Room air CC: Pulmonary consult for ILD/SOB. The patient c/o increased sob x1 year with exertion. Recent PFT's done @ [facility]. Comments Medications reviewed. Daytime phone verified. Michel Bickers [facility]  October 16, 2009 2:04 PM   Physical Exam  General:  normal appearance and healthy appearing.   Head:  normocephalic and atraumatic Eyes:  conjunctiva and sclera clear Nose:  no deformity, discharge, inflammation, or lesions Mouth:  no deformity or lesions Neck:  no masses, thyromegaly, or abnormal cervical nodes Chest Wall:  L superclav pacer Lungs:  good air movement Heart:  regular, 3/6 M Abdomen:  not examined Msk:  no deformity or scoliosis noted with normal posture Extremities:  no edema Neurologic:  non-focal Skin:  intact without lesions or rashes Psych:  alert and cooperative; normal mood and affect; normal attention span and  concentration   Pulmonary Function Test Date: 10/16/2009 Height (in.): 71 Gender: Male  Pre-Spirometry FVC    Value: 1.66 L/min   Pred: 3.89 L/min     % Pred: 42 % FEV1    Value: 1.15 L     Pred: 2.70 L     % Pred: 42 % FEV1/FVC  Value: 69 %     Pred: 70 %     % Pred: 99 % FEF 25-75  Value: 0.68 L/min   Pred: 1.72 L/min     % Pred: 39 %  Post-Spirometry FVC    Value: 1.59 L/min   Pred: 3.89 L/min     % Pred: 40 % FEV1    Value: 1.18 L     Pred: 2.70 L     % Pred: 43 % FEV1/FVC  Value: 74 %     Pred: 70 %     % Pred: 105 % FEF 25-75  Value: 0.71 L/min   Pred: 1.72 L/min     % Pred: 41 %  Comments: Mixed obstruction and restriction, FEV1 1.18L. No  improvement after BD. RSB  Impression & Recommendations:  Problem # 1:  SHORTNESS OF BREATH (ICD-786.05) This is multifactorial, but I agree with Dr Eden Emms that his several cardiac issues have all been addressed and are well compensated - now in NSR and s/p CABG/AVR, good volume status. He has mixed disease on PFT with low FEV1, suspect he will benefit from BD's. Will uses xopenex as his SABA to avoid perturbing his A Fib. Also with hx of asbestos exposure and probable ILD on CXR, will attempt to better characterize.  - Trial Spiriva + Xopenex - High res Ct scan; consider further ILD workup depending on results.  - walking oximetry and O2 if indicated  Problem # 2:  INTERSTITIAL LUNG DISEASE (ICD-515)  Problem # 3:  COPD (ICD-496)  Other Orders: Consultation Level IV (21308) Radiology Referral (Radiology)  Patient Instructions: 1)  Walking oximetry today showed that you do not need oxygen with exertion 2)  We will start Spiriva 1 inhalation once daily  3)  We will start Xopenex 2 puffs up to every 4 hours if needed for shortness of breath.  4)  We will perform a high resolution CT scan of the chest  5)  Please follow up with Dr Delton Coombes in 1 month  Appended Document: dyspnea, COPD, ILD Ambulatory Pulse Oximetry  Resting; HR__77_     02 Sat__92% on room air___  Lap1 (185 feet)   HR__90___   02 Sat__93% on room air___ Lap2 (185 feet)   HR__90___   02 Sat__93% on room air___    Lap3 (185 feet)   HR__100___   02 Sat__90% on room air___  _x__Test Completed without Difficulty ___Test Stopped due to:

## 2010-05-06 NOTE — Cardiovascular Report (Signed)
Summary: [facility]   Imported By: Roderic Ovens 09/18/2009 15:03:44  _____________________________________________________________________  External Attachment:    Type:   Image     Comment:   External Document

## 2010-05-06 NOTE — Medication Information (Signed)
Summary: rov/cb  Anticoagulant Therapy  Managed by: Bethena Midget, RN, BSN PCP: [name] Supervising MD: Jens Som MD, Arlys John Indication 1: Atrial Fibrillation Lab Used: [facility] [place] Site: [address] INR POC 1.7 INR RANGE 2-3  Dietary changes: no    Health status changes: no    Bleeding/hemorrhagic complications: no    Recent/future hospitalizations: no    Any changes in medication regimen? no    Recent/future dental: no  Any missed doses?: no       Is patient compliant with meds? yes       Allergies: 1)  ! Pcn  Anticoagulation Management History:      The patient is taking warfarin and comes in today for a routine follow up visit.  Positive risk factors for bleeding include an age of 75 years or older and presence of serious comorbidities.  The bleeding index is 'intermediate risk'.  Positive CHADS2 values include History of HTN and Age > 4 years old.  Anticoagulation responsible provider: Jens Som MD, Arlys John.  INR POC: 1.7.  Cuvette Lot#: [id number].  Exp: 11/2010.    Anticoagulation Management Assessment/Plan:      The patient's current anticoagulation dose is Warfarin sodium 2.5 mg tabs: Use as directed by Anticoagualtion Clinic.  The target INR is 2.0-3.0.  The next INR is due 10/12/2009.  Anticoagulation instructions were given to patient/spouse.  Results were reviewed/authorized by Bethena Midget, RN, BSN.  He was notified by Bethena Midget, RN, BSN.         Prior Anticoagulation Instructions: INR 1.9. Take an extra 1/2 tablet today, then take 1 tablet daily except 0.5 tablet on Sun and Thurs. Recheck in 10-14 days.   Current Anticoagulation Instructions: INR 1.7 Today take extra 1/2 tablet then change dose  to 1 tablet everyday except 1/2 tablet on Sundays. Recheck in 2 weeks.

## 2010-05-06 NOTE — Miscellaneous (Signed)
  Clinical Lists Changes  Observations: Added new observation of PAST SURG HX: Aortic valve replacement with 25 mm CE bovine pericardial valve and cornary artery bypass graft x3 with left internal mammary artery to left anterior descending, saphenous vein graft to obutse marginal 2 and saphenous vein graft to posterior desending cornary arteries indivually on 05/04/09 Status post implantation of dual chamber pacemaker, [employer] on 05/08/09 Cholecystectomy Tonsillectomy Lung-lobectomy (1991) Prostate surgery (1990) Left thumb surgery Vein stripping Basal cell surgery- nose Stress test- pos.  AS on Echo, LVH (09/2001) EGD- stricture (07/207) Colonoscopy- diverticulosis, small polyp, hemorrhoids (03/2001) ,  diverticulosis, hemorrhoids (01/2003) Cardiolite- neg., mild-mod AS (11/2003) Carotids- neg (11/2005) EGD- gastritis, stricture, dysmotility (07/2004) Rib films- right 10th rib fracture (08/2005) Echo- stable (09/2005) 3/09 2D echo- mod aortic stenosis with EF 55%  (07/01/2009 10:29)        Past History:  Past Surgical History: Aortic valve replacement with 25 mm CE bovine pericardial valve and cornary artery bypass graft x3 with left internal mammary artery to left anterior descending, saphenous vein graft to obutse marginal 2 and saphenous vein graft to posterior desending cornary arteries indivually on 05/04/09 Status post implantation of dual chamber pacemaker, [employer] on 05/08/09 Cholecystectomy Tonsillectomy Lung-lobectomy (1991) Prostate surgery (1990) Left thumb surgery Vein stripping Basal cell surgery- nose Stress test- pos.  AS on Echo, LVH (09/2001) EGD- stricture (07/207) Colonoscopy- diverticulosis, small polyp, hemorrhoids (03/2001) ,  diverticulosis, hemorrhoids (01/2003) Cardiolite- neg., mild-mod AS (11/2003) Carotids- neg (11/2005) EGD- gastritis, stricture, dysmotility (07/2004) Rib films- right 10th rib fracture (08/2005) Echo- stable  (09/2005) 3/09 2D echo- mod aortic stenosis with EF 55%

## 2010-05-06 NOTE — Medication Information (Signed)
Summary: rov/tm  Anticoagulant Therapy  Managed by: Weston Brass, PharmD PCP: [name] Supervising MD: Daleen Squibb MD, Maisie Fus Indication 1: Atrial Fibrillation Lab Used: [facility] [place] Site: [address] INR POC 2.4 INR RANGE 2-3  Dietary changes: no    Health status changes: no    Bleeding/hemorrhagic complications: no    Recent/future hospitalizations: no    Any changes in medication regimen? no    Recent/future dental: no  Any missed doses?: no       Is patient compliant with meds? yes       Allergies: 1)  ! Pcn  Anticoagulation Management History:      The patient is taking warfarin and comes in today for a routine follow up visit.  Positive risk factors for bleeding include an age of 30 years or older and presence of serious comorbidities.  The bleeding index is 'intermediate risk'.  Positive CHADS2 values include History of HTN and Age > 73 years old.  His last INR was 2.5.  Anticoagulation responsible Shabnam Ladd: Daleen Squibb MD, Maisie Fus.  INR POC: 2.4.  Cuvette Lot#: [id number].  Exp: 05/2011.    Anticoagulation Management Assessment/Plan:      The patient's current anticoagulation dose is Warfarin sodium 2.5 mg tabs: Use as directed by Anticoagualtion Clinic.  The target INR is 2.0-3.0.  The next INR is due 04/16/2010.  Anticoagulation instructions were given to patient/spouse.  Results were reviewed/authorized by Weston Brass, PharmD.  He was notified by Weston Brass PharmD.         Prior Anticoagulation Instructions: INR 2.1 Continue 1 pill everyday except 1 1/2 pills on Wednesdays. Recheck in 3 weeks.   Current Anticoagulation Instructions: INR 2.4  Continue same dose of 1 tablet every day except 1 1/2 tablets on Wednesday.  Recheck INR in 4 weeks.

## 2010-05-06 NOTE — Letter (Signed)
Summary: Cardioversion/TEE Instructions  Architectural technologist, Main Office  [address]   [place], [place] [address]   Phone: [phone]  Fax: [phone]    Cardioversion / TEE Cardioversion Instructions  You are scheduled for a Cardioversion  on _____________6/3/11_______________ with Dr. ______NISHAN___________________________________.   Please arrive at the [facility] of [facility] at ______9:30_______ a.m. / p.m. on the day of your procedure.  1)   DIET:  A)   Nothing to eat or drink after midnight except your medications with a sip of water.  B)   May have clear liquid breakfast, then nothing to eat or drink after _________ a.m. / p.m.      Clear liquids include:  water, broth, Sprite, Ginger Ale, black coffee, tea (no sugar),      cranberry / grape / apple juice, jello (not red), popsicle from clear juices (not red).  2)   Come to the [place] office on ____________________ for lab work. The lab at [facility] is open from 8:30 a.m. to 1:30 p.m. and 2:30 p.m. to 5:00 p.m. The lab at [facility] is open from 7:30 a.m. to 5:30 p.m. You do not have to be fasting.  3)   MAKE SURE YOU TAKE YOUR COUMADIN.  4)   A)   DO NOT TAKE these medications before your procedure:      ___________________________________________________________________     ___________________________________________________________________     ___________________________________________________________________  B)   YOU MAY TAKE ALL of your remaining medications with a small amount of water.    C)   START NEW medications:       ___________________________________________________________________     ___________________________________________________________________  5)  Must have a responsible person to drive you home.  6)   Bring a current list of your medications and current insurance cards.   * Special Note:  Every effort is made to have your  procedure done on time. Occasionally there are emergencies that present themselves at the hospital that may cause delays. Please be patient if a delay does occur.  * If you have any questions after you get home, please call the office at [phone].

## 2010-05-06 NOTE — Cardiovascular Report (Signed)
Summary: Cardiac Cath Other  Cardiac Cath Other   Imported By: Marylou Mccoy 07/02/2009 17:21:54  _____________________________________________________________________  External Attachment:    Type:   Image     Comment:   External Document

## 2010-05-06 NOTE — Progress Notes (Signed)
Summary: PA-Xopenex [facility] APPROVED 02-17-10 THROUGH 04-03-10  Phone Note From Pharmacy   [facility]  [address] Call For: Byrum  Summary of Call: Per Insurance they will not cover Xopenex HFA inhaler-call [phone]; I spoke with Neysa Bonito at [facility] insurance-Approved from 02-17-10 through 04-03-10. I spoke with Leonette Most at [facility] -RX went through. Initial call taken by: Reynaldo Minium CMA,  February 17, 2010 4:49 PM

## 2010-05-06 NOTE — Progress Notes (Signed)
Summary: prescript  Phone Note Call from Patient   Caller: Patient Call For: byrum Summary of Call: need refill on symbicort prescript and other inhaler but not sure of the name. [facility] [place] Initial call taken by: Rickard Patience,  February 17, 2010 3:40 PM  Follow-up for Phone Call        Spoke with wife-Symbicort was started at last OV per RB and I have added this to his med list. Refills for Xopenex HFA and Symbicort has been [facility] [place]-pts wife is aware that pt is due for OV with RB 04-2010. Reynaldo Minium CMA  February 17, 2010 3:52 PM     New/Updated Medications: SYMBICORT 160-4.5 MCG/ACT AERO (BUDESONIDE-FORMOTEROL FUMARATE) 2 puffs two times a day and RINSE MOUTH WELL AFTER USE Prescriptions: XOPENEX HFA 45 MCG/ACT AERO (LEVALBUTEROL TARTRATE) 2 puffs up to four times daily as needed  #1 x 2   Entered by:   Reynaldo Minium CMA   Authorized by:   Leslye Peer MD   Signed by:   Reynaldo Minium CMA on 02/17/2010   Method used:   [facility]        [facility]  [address] (retail)       [address]       [place], [place]  [address]       Ph: [phone] or [phone]       Fax: [phone]   RxID:   [id number] SYMBICORT 160-4.5 MCG/ACT AERO (BUDESONIDE-FORMOTEROL FUMARATE) 2 puffs two times a day and RINSE MOUTH WELL AFTER USE  #1 x 2   Entered by:   Reynaldo Minium CMA   Authorized by:   Leslye Peer MD   Signed by:   Reynaldo Minium CMA on 02/17/2010   Method used:   [facility]        [facility]  [address] (retail)       [address]       [place], [place]  [address]       Ph: [phone] or [phone]       Fax: [phone]   RxID:   [id number]

## 2010-05-06 NOTE — Progress Notes (Signed)
Summary: medication clarificatio  Phone Note From Pharmacy   Caller: Pharamacist Summary of Call:  Please call Daughter June [phone] before the pharamacy. Per CHarles needs clarification on pt medication/ [phone] Initial call taken by: Edman Circle,  July 13, 2009 9:41 AM  Follow-up for Phone Call        spoke with june, questions answered Deliah Goody, RN  July 13, 2009 9:56 AM     Prescriptions: POTASSIUM CHLORIDE CRYS CR 20 MEQ CR-TABS (POTASSIUM CHLORIDE CRYS CR) 1 tab by mouth once daily  #30 x 12   Entered by:   Deliah Goody, RN   Authorized by:   Colon Branch, MD, [facility]   Signed by:   Deliah Goody, RN on 07/13/2009   Method used:   [facility]ronically to        [facility]  [place] (retail)       [address]       [place], [place]  [address]       Ph: [phone] or [phone]       Fax: [phone]   RxID:   [id number] FUROSEMIDE 40 MG TABS (FUROSEMIDE) 1 tab by mouth two times a day  #60 x 12   Entered by:   Deliah Goody, RN   Authorized by:   Colon Branch, MD, [facility]   Signed by:   Deliah Goody, RN on 07/13/2009   Method used:   Electronically to        [facility]  [address] (retail)       [address]       [place], [place]  [address]       Ph: [phone] or [phone]       Fax: [phone]   RxID:   [id number]

## 2010-05-06 NOTE — Medication Information (Signed)
Summary: rov/sp  Anticoagulant Therapy  Managed by: Bethena Midget, RN, BSN PCP: [name] Supervising MD: Gala Romney MD, Xzavian Semmel Indication 1: Atrial Fibrillation Lab Used: [facility] [place] Site: [address] INR POC 4.0 INR RANGE 2-3  Dietary changes: no    Health status changes: yes       Details: Saw PCP on Friday, Dx with Bronchitis  Bleeding/hemorrhagic complications: no    Recent/future hospitalizations: no    Any changes in medication regimen? yes       Details: Bactrim 1 BID for 10 days started on 04/09/10  Recent/future dental: no  Any missed doses?: no       Is patient compliant with meds? yes      Comments: Pt restarted back on Oxygen  Allergies: 1)  ! Pcn  Anticoagulation Management History:      The patient is taking warfarin and comes in today for a routine follow up visit.  Positive risk factors for bleeding include an age of 55 years or older and presence of serious comorbidities.  The bleeding index is 'intermediate risk'.  Positive CHADS2 values include History of HTN and Age > 14 years old.  His last INR was 2.5.  Anticoagulation responsible provider: Lloyde Ludlam MD, Reuel Boom.  INR POC: 4.0.  Cuvette Lot#: [id number].  Exp: 05/2011.    Anticoagulation Management Assessment/Plan:      The patient's current anticoagulation dose is Warfarin sodium 2.5 mg tabs: Use as directed by Anticoagualtion Clinic.  The target INR is 2.0-3.0.  The next INR is due 04/23/2010.  Anticoagulation instructions were given to patient/spouse.  Results were reviewed/authorized by Bethena Midget, RN, BSN.  He was notified by Bethena Midget, RN, BSN.         Prior Anticoagulation Instructions: INR 2.4  Continue same dose of 1 tablet every day except 1 1/2 tablets on Wednesday.  Recheck INR in 4 weeks.   Current Anticoagulation Instructions: INR 4.0 Skip Wednesday's dose( Tomorrow) , then change Thursdays and Saturdays dose to 1/2 pill while on Antibiotic and all other  days remain 1 pill everyday except 1.5 pill on next Wednesday.

## 2010-05-06 NOTE — Medication Information (Signed)
Summary: rov/sp  Anticoagulant Therapy  Managed by: Bethena Midget, RN, BSN PCP: [name] Supervising MD: Daleen Squibb MD, Maisie Fus Indication 1: Atrial Fibrillation Lab Used: [facility] [place] Site: [address] INR POC 2.4 INR RANGE 2-3  Dietary changes: no    Health status changes: yes       Details: weakness, enerygy level low. Pulse rate regular at 80bpm  Bleeding/hemorrhagic complications: no    Recent/future hospitalizations: no    Any changes in medication regimen? no     Any missed doses?: no       Is patient compliant with meds? yes       Allergies: 1)  ! Pcn  Anticoagulation Management History:      The patient is taking warfarin and comes in today for a routine follow up visit.  Positive risk factors for bleeding include an age of 75 years or older and presence of serious comorbidities.  The bleeding index is 'intermediate risk'.  Positive CHADS2 values include History of HTN and Age > 9 years old.  Anticoagulation responsible provider: Daleen Squibb MD, Maisie Fus.  INR POC: 2.4.  Cuvette Lot#: [id number].  Exp: 11/2010.    Anticoagulation Management Assessment/Plan:      The patient's current anticoagulation dose is Warfarin sodium 2.5 mg tabs: Use as directed by Anticoagualtion Clinic.  The target INR is 2.0-3.0.  The next INR is due 09/18/2009.  Anticoagulation instructions were given to patient.  Results were reviewed/authorized by Bethena Midget, RN, BSN.  He was notified by Bethena Midget, RN, BSN.         Prior Anticoagulation Instructions: INR 4.2  Skip tomorrow's dose of Coumadin then decrease dose to 1 tablet every day except 1/2 tablet on Sunday and Thursday   Current Anticoagulation Instructions: INR 2.4 Continue 2.5mg s everyday except 1.25mg s on Sundays and Thursdays. Recheck in 10 days.

## 2010-05-06 NOTE — Progress Notes (Signed)
Summary: Rx Zolpidem  Phone Note Refill Request Call back at [phone] [facility] on May 25, 2009 11:43 AM  Refills Requested: Medication #1:  AMBIEN 10 MG  TABS 1 by mouth at bedtime as needed insomnia   Last Refilled: 07/05/2007 Received faxed refill request, please advise   Method Requested: Telephone to Pharmacy Initial call taken by: Linde Gillis CMA Duncan Dull),  May 25, 2009 11:43 AM  Follow-up for Phone Call        px written on EMR for call in please ask him to use extreme caution with this - especially after his heart surgery- it can inc risk of falls at night - thanks   Follow-up by: Judith Part MD,  May 25, 2009 12:03 PM  Additional Follow-up for Phone Call Additional follow up Details #1::        Patient's wife notified as instructed by telephone. [facility] as instructed. Lewanda Rife LPN  May 25, 2009 4:50 PM     New/Updated Medications: AMBIEN 10 MG  TABS (ZOLPIDEM TARTRATE) 1 by mouth at bedtime as needed insomnia Prescriptions: AMBIEN 10 MG  TABS (ZOLPIDEM TARTRATE) 1 by mouth at bedtime as needed insomnia  #30 x 0   Entered and Authorized by:   Judith Part MD   Signed by:   Lewanda Rife LPN on 45/40/9811   Method used:   Telephoned to ...       [facility] (mail-order)             , [place]         Ph: [phone]       Fax: [phone]   RxID:   [id number]

## 2010-05-06 NOTE — Medication Information (Signed)
Summary: rov/sl  Anticoagulant Therapy  Managed by: Bethena Midget, RN, BSN PCP: [name] Supervising MD: Myrtis Ser MD, Tinnie Gens Indication 1: Atrial Fibrillation Lab Used: [facility] [place] Site: [address] INR POC 2.1 INR RANGE 2-3  Dietary changes: no    Health status changes: no    Bleeding/hemorrhagic complications: no    Recent/future hospitalizations: no    Any changes in medication regimen? no    Recent/future dental: no  Any missed doses?: no       Is patient compliant with meds? yes       Allergies: 1)  ! Pcn  Anticoagulation Management History:      The patient is taking warfarin and comes in today for a routine follow up visit.  Positive risk factors for bleeding include an age of 64 years or older and presence of serious comorbidities.  The bleeding index is 'intermediate risk'.  Positive CHADS2 values include History of HTN and Age > 4 years old.  His last INR was 2.5.  Anticoagulation responsible provider: Myrtis Ser MD, Tinnie Gens.  INR POC: 2.1.  Cuvette Lot#: [id number].  Exp: 03/2011.    Anticoagulation Management Assessment/Plan:      The patient's current anticoagulation dose is Warfarin sodium 2.5 mg tabs: Use as directed by Anticoagualtion Clinic.  The target INR is 2.0-3.0.  The next INR is due 03/23/2010.  Anticoagulation instructions were given to patient/spouse.  Results were reviewed/authorized by Bethena Midget, RN, BSN.  He was notified by Bethena Midget, RN, BSN.         Prior Anticoagulation Instructions: INR 1.9  Tomorrow, November 10th,  take Coumadin 1.5 tab (3.75 mg).  Then, resume taking Coumadin 1 tab (2.5 mg) all days except for Coumadin 1.5 tabs (3.75 mg) on Wednesdays. Return to clinic in 2 weeks.   Current Anticoagulation Instructions: INR 2.1 Continue 1 pill everyday except 1 1/2 pills on Wednesdays. Recheck in 3 weeks.

## 2010-05-06 NOTE — Assessment & Plan Note (Signed)
Summary: per check out/sf   Referring Provider:  [name] Primary Provider:  [name]  CC:  dizziness, and sob, and fatigue.  History of Present Illness: Adam [name] is seen today for F/U of AVR/CABG/ at [facility].  Complicated by CHB and pacer.  Having some postural symptoms and weakness with ongoing SOB.  CXR with interstitial fibrosis.  BNP was around 400.  Echo today shows EF 50% LVH normal funcitoning AVR.  ? mass on pulmonary valve with estimated PA pressure 38 mmHG.  RV is dilated with pacing wire across valve.  History of PAF on low dose amiodarone.  V pacing today.  Rx of diastolic dysfunction complicated by pre-renal azotemia with Cr around 2.0.  Will decrease diuretic to daily dose and see if weakness and BP imporve without worsening BNP and dyspnea.  Consider CT for interstial fibrosis and or R/O PE depending on labs in light of ongoing dyspnea.  Will continue cardiac rehab.  Will interogate pacer next visit and see if PAF is an issue.  Currently not on coumadin post op.    Current Problems (verified): 1)  Cardiac Pacemaker in Situ  (ICD-V45.01) 2)  Shortness of Breath  (ICD-786.05) 3)  Constipation  (ICD-564.00) 4)  Aortic Stenosis  (ICD-424.1) 5)  Premature Ventricular Contractions  (ICD-427.69) 6)  Syncope  (ICD-780.2) 7)  Hypercholesterolemia  (ICD-272.0) 8)  Arthritis  (ICD-716.90) 9)  Hx of Cad  (ICD-414.00) 10)  Hypertension  (ICD-401.9) 11)  Hyperlipidemia  (ICD-272.4) 12)  COPD  (ICD-496) 13)  Hx of Aortic Stenosis  (ICD-424.1) 14)  Renal Insufficiency  (ICD-588.9) 15)  Carcinoma, Colon, Family Hx  (ICD-V16.0) 16)  Rectal Bleeding  (ICD-569.3) 17)  Change in Bowels  (ICD-787.99) 18)  Gout, Unspecified  (ICD-274.9) 19)  Hiatal Hernia With Reflux  (ICD-553.3) 20)  Gastritis  (ICD-535.50) 21)  Esophageal Stricture  (ICD-530.3) 22)  Hemorrhoids, External  (ICD-455.3) 23)  Gouty Arthropathy  (ICD-274.0) 24)  Hx of Benign Prostatic Hypertrophy, Hx of   (ICD-V13.8) 25)  Hx of Allergy  (ICD-995.3) 26)  Hx of Venous Insufficiency  (ICD-459.81) 27)  Erectile Dysfunction  (ICD-302.72) 28)  Osteoarthritis  (ICD-715.90) 29)  Gerd  (ICD-530.81) 30)  Diverticulosis, Colon  (ICD-562.10) 31)  Lung Cancer, Hx of  (ICD-V10.11)  Current Medications (verified): 1)  Tylenol 8 Hour 650 Mg Tbcr (Acetaminophen) .... Take 3 By Mouth Daily 2)  Prevacid 30 Mg Cpdr (Lansoprazole) .... Take 1 Tablet By Mouth Two Times A Day 3)  Ambien 10 Mg  Tabs (Zolpidem Tartrate) .Marland Kitchen.. 1 By Mouth At Bedtime As Needed Insomnia 4)  Aspirin 81 Mg Tbec (Aspirin) .... Take One By Mouth Once Daily 5)  Senokot 8.6 Mg Tabs (Sennosides) .... As Needed 6)  Colchicine 0.6 Mg Tabs (Colchicine) .... One Tablet By Mouth Twice Daily As Needed For Gout 7)  Furosemide 40 Mg Tabs (Furosemide) .Marland Kitchen.. 1 Tab By Mouth By Mouth Daily 8)  Metoprolol Tartrate 25 Mg Tabs (Metoprolol Tartrate) .... Take One Tablet By Mouth Twice A Day 9)  Lipitor 10 Mg Tabs (Atorvastatin Calcium) .Marland Kitchen.. 1 Tab Op Once Daily 10)  Lopid 600 Mg Tabs (Gemfibrozil) .Marland Kitchen.. 1 Tab By Mouth Once Daily 11)  Amiodarone Hcl 200 Mg Tabs (Amiodarone Hcl) .Marland Kitchen.. 1 Tab By Mouth Once Daily 12)  Colace 100 Mg Caps (Docusate Sodium) .... As Needed 13)  Zoloft 25 Mg Tabs (Sertraline Hcl) .... 1/2  Tab By Mouth Once Daily  Allergies (verified): 1)  ! Pcn  Past History:  Past  Medical History: Last updated: 12/16/2008  Lung cancer, hx of  COPD  Diverticulosis, colon  GERD/ Stricture  Esophageal dysmotility  Hyperlipidemia  Hypertension Osteoarthritis  Padget's  aortic stenosis  mild renal insufficiency  erectile dysfunction venous insufficiency Gout BPH  Past Surgical History: Last updated: 07/01/2009 Aortic valve replacement with 25 mm CE bovine pericardial valve and cornary artery bypass graft x3 with left internal mammary artery to left anterior descending, saphenous vein graft to obutse marginal 2 and saphenous vein graft to  posterior desending cornary arteries indivually on 05/04/09 Status post implantation of dual chamber pacemaker, [employer] on 05/08/09 Cholecystectomy Tonsillectomy Lung-lobectomy (1991) Prostate surgery (1990) Left thumb surgery Vein stripping Basal cell surgery- nose Stress test- pos.  AS on Echo, LVH (09/2001) EGD- stricture (07/207) Colonoscopy- diverticulosis, small polyp, hemorrhoids (03/2001) ,  diverticulosis, hemorrhoids (01/2003) Cardiolite- neg., mild-mod AS (11/2003) Carotids- neg (11/2005) EGD- gastritis, stricture, dysmotility (07/2004) Rib films- right 10th rib fracture (08/2005) Echo- stable (09/2005) 3/09 2D echo- mod aortic stenosis with EF 55%  Family History: Last updated: 12/16/2008 Father with CAD @ 19 mother with pancreatic ca sister with ?abdominal ca Colon Cancer: brother age 11- died   Social History: Last updated: 11/07/2008 quit smoking 1991 Patient is a former smoker.  Alcohol Use - no Daily Caffeine Use coffee at breakfast golf for exercise  Review of Systems       Denies fever, , blurry vision, decreased visual acuity, cough, sputum,  hemoptysis, pleuritic pain, palpitaitons, heartburn, abdominal pain, melena, lower extremity edema, claudication, or rash.   Vital Signs:  Patient profile:   77 year old male Height:      71 inches Weight:      161 pounds BMI:     22.54 Pulse rate:   68 / minute Pulse rhythm:   v paced Resp:     14 per minute BP sitting:   120 / 70  (left arm) BP standing:   100 /   Vitals Entered By: Kem Parkinson (July 30, 2009 4:13 PM)  Physical Exam  General:  Affect appropriate Healthy:  appears stated age HEENT: normal Neck supple with no adenopathy JVP normal no bruits no thyromegaly Lungs inspitory crackles both bases no wheezing and good diaphragmatic motion Heart:  S1/S2 sytolic  murmur,rub, gallop or click PMI normal Abdomen: benighn, BS positve, no tenderness, no AAA no bruit.  No HSM or  HJR Distal pulses intact with no bruits No edema Neuro non-focal Skin warm and dry Pacer under left clavicle   Impression & Recommendations:  Problem # 1:  CARDIAC PACEMAKER IN SITU (ICD-V45.01) Normal function F/U pacer clinic  Problem # 2:  SHORTNESS OF BREATH (ICD-786.05) Multifactorial.  COPD ? pulmonary fibrosis post pump run.  Consdier CT.  Diastolic dysfuntion decrease diuretic.  F/U BNP BMET today. EF ok and AVR working well.   His updated medication list for this problem includes:    Aspirin 81 Mg Tbec (Aspirin) .Marland Kitchen... Take one by mouth once daily    Furosemide 40 Mg Tabs (Furosemide) .Marland Kitchen... 1 tab by mouth by mouth daily    Metoprolol Tartrate 25 Mg Tabs (Metoprolol tartrate) .Marland Kitchen... Take one tablet by mouth twice a day  Orders: TLB-BMP (Basic Metabolic Panel-BMET) (80048-METABOL) TLB-BNP (B-Natriuretic Peptide) (83880-BNPR)  Problem # 3:  AORTIC STENOSIS (ICD-424.1) S/P tissue AVR  SBE prophylaxis His updated medication list for this problem includes:    Furosemide 40 Mg Tabs (Furosemide) .Marland Kitchen... 1 tab by mouth by mouth daily  Metoprolol Tartrate 25 Mg Tabs (Metoprolol tartrate) .Marland Kitchen... Take one tablet by mouth twice a day  Problem # 4:  Hx of CAD (ICD-414.00) S/P CABG no angina His updated medication list for this problem includes:    Aspirin 81 Mg Tbec (Aspirin) .Marland Kitchen... Take one by mouth once daily    Metoprolol Tartrate 25 Mg Tabs (Metoprolol tartrate) .Marland Kitchen... Take one tablet by mouth twice a day  Problem # 5:  ATRIAL FIBRILLATION, PAROXYSMAL (ICD-427.31) V pacing today.  Continue low dose Amiodarone.  Not an ideal candidate for coumadin with history or rectal bleeding and gastritis.  Interogate pacer next visit.   His updated medication list for this problem includes:    Aspirin 81 Mg Tbec (Aspirin) .Marland Kitchen... Take one by mouth once daily    Metoprolol Tartrate 25 Mg Tabs (Metoprolol tartrate) .Marland Kitchen... Take one tablet by mouth twice a day    Amiodarone Hcl 200 Mg Tabs  (Amiodarone hcl) .Marland Kitchen... 1 tab by mouth once daily  Patient Instructions: 1)  Your physician recommends that you schedule a follow-up appointment in: 2-3 weeks 2)  Your physician has recommended you make the following change in your medication:  Decrease furosemide to  one tablet daily. 3)  Stop potassium Prescriptions: FUROSEMIDE 40 MG TABS (FUROSEMIDE) 1 tab by mouth by mouth daily  #30 x 6   Entered by:   Dossie Arbour, RN, BSN   Authorized by:   Colon Branch, MD, [facility]   Signed by:   Dossie Arbour, RN, BSN on 07/30/2009   Method used:   [facility]        [facility]  [place] [facility]retail)       [address]       [place], [place]  [address]       Ph: [phone] or [phone]       Fax: [phone]   RxID:   [id number]

## 2010-05-06 NOTE — Assessment & Plan Note (Signed)
Summary: per check out/sf   Referring Adam [name]:  Dr. Eden [name] Primary Adam [name]:  Adam [name]  CC:  sob.  History of Present Illness: Adam [name] is seen today for F/U of dyspnea, afib, AVR, CAD.  We cardioverted him in June and he is maintaining NSR.  He has a history of diastolic dysfunction and intolerance to afib but unfortunately his dyspnea is minimally improved.  He has good LV function and a normal functioning AVR.  He has been on an adequate dose of Lasix for mildly elevated BNP with higher doses causing prerenal azotemia.   He is off amiodarone at this time.Marland Kitchen  His CXR shows diffuse interstitial lung disease and he had pre-existing COPD before his CABG/AVR at [facility] in March.  At this point we will refer him to pulmonary as I believe his dyspnea is primarily pulmonary at this point.  He finished cardiac rehab at [facility] and he may be a candidate for pulmonary rehab.  He had oxygen upon D/C from [facility] but doesn't think he needs it now.  He will get his INR checked today and again in 4 weeks.   He had a nice trip to [place] and actually fished and caught some flounder.  At this point I think it is clear that his dyspnea is more related to his lung disease   Current Problems (verified): 1)  Interstitial Lung Disease  (ICD-515) 2)  Encounter For Long-term Use of Other Medications  (ICD-V58.69) 3)  Coumadin Therapy  (ICD-V58.61) 4)  Atrial Fibrillation, Paroxysmal  (ICD-427.31) 5)  Cardiac Pacemaker in Situ  (ICD-V45.01) 6)  Shortness of Breath  (ICD-786.05) 7)  Constipation  (ICD-564.00) 8)  Aortic Stenosis  (ICD-424.1) 9)  Premature Ventricular Contractions  (ICD-427.69) 10)  Syncope  (ICD-780.2) 11)  Hypercholesterolemia  (ICD-272.0) 12)  Arthritis  (ICD-716.90) 13)  Hx of Cad  (ICD-414.00) 14)  Hypertension  (ICD-401.9) 15)  Hyperlipidemia  (ICD-272.4) 16)  COPD  (ICD-496) 17)  Renal Insufficiency  (ICD-588.9) 18)  Carcinoma, Colon, Family Hx  (ICD-V16.0) 19)  Rectal Bleeding   (ICD-569.3) 20)  Change in Bowels  (ICD-787.99) 21)  Gout, Unspecified  (ICD-274.9) 22)  Hiatal Hernia With Reflux  (ICD-553.3) 23)  Gastritis  (ICD-535.50) 24)  Esophageal Stricture  (ICD-530.3) 25)  Hemorrhoids, External  (ICD-455.3) 26)  Gouty Arthropathy  (ICD-274.0) 27)  Hx of Benign Prostatic Hypertrophy, Hx of  (ICD-V13.8) 28)  Hx of Allergy  (ICD-995.3) 29)  Hx of Venous Insufficiency  (ICD-459.81) 30)  Erectile Dysfunction  (ICD-302.72) 31)  Osteoarthritis  (ICD-715.90) 32)  Gerd  (ICD-530.81) 33)  Diverticulosis, Colon  (ICD-562.10) 34)  Lung Cancer, Hx of  (ICD-V10.11)  Current Medications (verified): 1)  Tylenol 8 Hour 650 Mg Tbcr (Acetaminophen) .... Take 3 By Mouth Daily 2)  Prevacid 30 Mg Cpdr (Lansoprazole) .... Take 1 Tablet By Mouth Two Times A Day 3)  Aspirin 81 Mg Tbec (Aspirin) .... Take One By Mouth Once Daily 4)  Senokot 8.6 Mg Tabs (Sennosides) .... As Needed 5)  Colchicine 0.6 Mg Tabs (Colchicine) .... One Tablet By Mouth Twice Daily As Needed For Gout 6)  Furosemide 40 Mg Tabs (Furosemide) .Marland Kitchen.. 1 Tab By Mouth By Mouth Daily 7)  Metoprolol Tartrate 50 Mg Tabs (Metoprolol Tartrate) .... Take One Tablet By Mouth Twice A Day 8)  Lipitor 10 Mg Tabs (Atorvastatin Calcium) .Marland Kitchen.. 1 Tab Op Once Daily 9)  Lopid 600 Mg Tabs (Gemfibrozil) .Marland Kitchen.. 1 Tab By Mouth Once Daily 10)  Amiodarone Hcl 200 Mg Tabs (Amiodarone  Hcl) .... 1 Tab By Mouth Once Daily 11)  Colace 100 Mg Caps (Docusate Sodium) .... As Needed 12)  Zoloft 25 Mg Tabs (Sertraline Hcl) .... 1/2  Tab By Mouth Once Daily 13)  Warfarin Sodium 2.5 Mg Tabs (Warfarin Sodium) .... Use As Directed By Anticoagualtion Clinic 14)  Meloxicam  (Meloxicam) .... 3 or 4 Times Weekly 15)  Benadryl 25 Mg Tabs (Diphenhydramine Hcl) .Marland Kitchen.. 1 Tab By Mouth Once Daily  Allergies (verified): 1)  ! Pcn  Past History:  Past Medical History: Last updated: 12/16/2008  Lung cancer, hx of  COPD  Diverticulosis, colon  GERD/  Stricture  Esophageal dysmotility  Hyperlipidemia  Hypertension Osteoarthritis  Padget's  aortic stenosis  mild renal insufficiency  erectile dysfunction venous insufficiency Gout BPH  Past Surgical History: Last updated: 07/01/2009 Aortic valve replacement with 25 mm CE bovine pericardial valve and cornary artery bypass graft x3 with left internal mammary artery to left anterior descending, saphenous vein graft to obutse marginal 2 and saphenous vein graft to posterior desending cornary arteries indivually on 05/04/09 Status post implantation of dual chamber pacemaker, [employer] on 05/08/09 Cholecystectomy Tonsillectomy Lung-lobectomy (1991) Prostate surgery (1990) Left thumb surgery Vein stripping Basal cell surgery- nose Stress test- pos.  AS on Echo, LVH (09/2001) EGD- stricture (07/207) Colonoscopy- diverticulosis, small polyp, hemorrhoids (03/2001) ,  diverticulosis, hemorrhoids (01/2003) Cardiolite- neg., mild-mod AS (11/2003) Carotids- neg (11/2005) EGD- gastritis, stricture, dysmotility (07/2004) Rib films- right 10th rib fracture (08/2005) Echo- stable (09/2005) 3/09 2D echo- mod aortic stenosis with EF 55%  Family History: Last updated: 10/16/2009 Father with CAD @ 43 mother with pancreatic ca sister with ?abdominal ca Colon Cancer: brother age 12- died  Heart disease---MGM  Social History: Last updated: 10/16/2009 quit smoking 1991 Patient is a former smoker.  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   Review of Systems       Denies fever, malais, weight loss, blurry vision, decreased visual acuity, cough, sputum,  hemoptysis, pleuritic pain, palpitaitons, heartburn, abdominal pain, melena, lower extremity edema, claudication, or rash.   Vital Signs:  Patient profile:   77 year old male Height:      71 inches Weight:      164  pounds BMI:     22.96 Pulse rate:   77 / minute Resp:     14 per minute BP sitting:   100 / 61  (left arm)  Vitals Entered By: Kem Parkinson (November 16, 2009 4:14 PM)  Physical Exam  [name]:  Affect appropriate Healthy:  appears stated age HEENT: normal Neck supple with no adenopathy JVP normal no bruits no thyromegaly Lungs clear with no wheezing and good diaphragmatic motion Heart:  S1/S2 no murmur,rub, gallop or click PMI normal Abdomen: benighn, BS positve, no tenderness, no AAA no bruit.  No HSM or HJR Distal pulses intact with no bruits No edema Neuro non-focal Skin warm and dry Large LE varicosities right greater than left   PPM Specifications Following MD:  Sherryl Manges, MD     Referring MD:  [facility] Vendor:  [employer]     PPM Model Number:  [id number]     PPM Serial Number:  [id number] PPM DOI:  05/08/2009     PPM Implanting MD:  NOT IMPLANTED HERE  Lead 1    Location: RA     DOI: 05/08/2009     Model #: [id number]  Serial #: [id number]     Status: active Lead 2    Location: RV     DOI: 05/08/2009     Model #: [id number]     Serial #: [id number]     Status: active   Indications:  AFIB/CHB   PPM Follow Up Pacer Dependent:  No      Episodes Coumadin:  No  Parameters Mode:  DDDR     Lower Rate Limit:  70     Upper Rate Limit:  120 Paced AV Delay:  200     Sensed AV Delay:  150  Impression & Recommendations:  Problem # 1:  INTERSTITIAL LUNG DISEASE (ICD-515) F/U Dr Delton Coombes.  Recent CT with no change.  Intolerant to symbicort.  Continue albuterol  Problem # 2:  COUMADIN THERAPY (ICD-V58.61) Rx INR with no bleeding diathesis  Problem # 3:  CARDIAC PACEMAKER IN SITU (ICD-V45.01) Normal finction post CHB with AVR.  Next pacer clinic will check to see frequency of mode switches. and PAF  Problem # 4:  AORTIC STENOSIS (ICD-424.1) S/P tissue AVR with normal exam and no AR His updated medication list for this problem includes:    Furosemide 40 Mg Tabs (Furosemide) .Marland Kitchen... 1  tab by mouth by mouth daily    Metoprolol Tartrate 50 Mg Tabs (Metoprolol tartrate) .Marland Kitchen... Take one tablet by mouth twice a day  Problem # 5:  HYPERCHOLESTEROLEMIA (ICD-272.0) At goal with no side effects His updated medication list for this problem includes:    Lipitor 10 Mg Tabs (Atorvastatin calcium) .Marland Kitchen... 1 tab op once daily    Lopid 600 Mg Tabs (Gemfibrozil) .Marland Kitchen... 1 tab by mouth once daily  CHOL: 199 (10/28/2008)   LDL: DEL (01/09/2008)   HDL: 38.10 (10/28/2008)   TG: 300.0 (10/28/2008)  Problem # 6:  Hx of CAD (ICD-414.00) 3VD post CABG.  No angina His updated medication list for this problem includes:    Aspirin 81 Mg Tbec (Aspirin) .Marland Kitchen... Take one by mouth once daily    Metoprolol Tartrate 50 Mg Tabs (Metoprolol tartrate) .Marland Kitchen... Take one tablet by mouth twice a day    Warfarin Sodium 2.5 Mg Tabs (Warfarin sodium) ..... Use as directed by anticoagualtion clinic  Patient Instructions: 1)  Your physician recommends that you schedule a follow-up appointment in: 3 MONTHS

## 2010-05-06 NOTE — Medication Information (Signed)
Summary: rov/eac  Anticoagulant Therapy  Managed by: Bethena Midget, RN, BSN PCP: [name] Supervising MD: Excell Seltzer MD, Casimiro Needle Indication 1: Atrial Fibrillation Lab Used: [facility] [place] Site: [address] INR POC 1.5 INR RANGE 2-3  Dietary changes: no    Health status changes: no    Bleeding/hemorrhagic complications: no    Recent/future hospitalizations: no    Any changes in medication regimen? no    Recent/future dental: no  Any missed doses?: no       Is patient compliant with meds? yes       Allergies: 1)  ! Pcn  Anticoagulation Management History:      The patient is taking warfarin and comes in today for a routine follow up visit.  Positive risk factors for bleeding include an age of 75 years or older and presence of serious comorbidities.  The bleeding index is 'intermediate risk'.  Positive CHADS2 values include History of HTN and Age > 64 years old.  His last INR was 2.5.  Anticoagulation responsible provider: Excell Seltzer MD, Casimiro Needle.  INR POC: 1.5.  Cuvette Lot#: [id number].  Exp: 02/2011.    Anticoagulation Management Assessment/Plan:      The patient's current anticoagulation dose is Warfarin sodium 2.5 mg tabs: Use as directed by Anticoagualtion Clinic.  The target INR is 2.0-3.0.  The next INR is due 01/11/2010.  Anticoagulation instructions were given to patient/spouse.  Results were reviewed/authorized by Bethena Midget, RN, BSN.  He was notified by Bethena Midget, RN, BSN.         Prior Anticoagulation Instructions: INR 1.5  Take an extra 1/2 tablet today.  Then return to normal dosing schedule of 1 tablet every other day.  Return to clinic in 2 weeks.    Current Anticoagulation Instructions: INR 1.5 Today take extra 1/2 pill and tomorrow take 1 1/2 pill then change dose to 1 pill everyday. Recheck in 2 weeks.

## 2010-05-06 NOTE — Progress Notes (Signed)
Summary: speak to nurse  Phone Note Call from Patient Call back at [phone]   Caller: Daughter/June Reason for Call: Talk to Nurse Summary of Call: req to speak to nurse Initial call taken by: Migdalia Dk,  May 04, 2009 9:13 AM  Follow-up for Phone Call        spoke with dtr june, pt is currently at [facility], he had his cath on friday and today he is getting CABG and valve replacement. dr Eden Emms aware Deliah Goody, RN  May 04, 2009 10:14 AM

## 2010-05-06 NOTE — Cardiovascular Report (Signed)
Summary: Office Visit   Office Visit   Imported By: Roderic Ovens 08/03/2009 15:23:59  _____________________________________________________________________  External Attachment:    Type:   Image     Comment:   External Document

## 2010-05-06 NOTE — Progress Notes (Signed)
Summary: cough is no better  Phone Note Call from Patient Call back at Home Phone [phone]   Caller: Patient Call For: Judith Part MD Summary of Call: Patient was seen on 1-6. Patient's wife says that his cough hasn't gotten any better. She is asking if he could have something called in for his couigh to [employer].  Initial call taken by: Melody Comas,  April 14, 2010 1:19 PM  Follow-up for Phone Call        let me know if fever or other symptoms and keep pulmonary f/u appt as planned  will try guifenesin with codiene- be very careful of sedation  update if worse or f/u  Follow-up by: Judith Part MD,  April 14, 2010 1:28 PM  Additional Follow-up for Phone Call Additional follow up Details #1::        Patient's wife notified as instructed by telephone. June nor pt is availabld now and pt's wife said to tell her the info.Medication phoned to [facility] as instructed. Lewanda Rife LPN  April 14, 2010 1:55 PM     New/Updated Medications: GUAIFENESIN AC 100-10 MG/5ML SYRP (GUAIFENESIN-CODEINE) 1-2 teaspoons by mouth up to every 4-6 hours as needed cough watch for sedation Prescriptions: GUAIFENESIN AC 100-10 MG/5ML SYRP (GUAIFENESIN-CODEINE) 1-2 teaspoons by mouth up to every 4-6 hours as needed cough watch for sedation  #120cc x 0   Entered by:   Lewanda Rife LPN   Authorized by:   Judith Part MD   Signed by:   Lewanda Rife LPN on 09/81/1914   Method used:   Telephoned to ...       [facility]  [address] (retail)       [address]       [place], [place]  [address]       Ph: [phone] or [phone]       Fax: [phone]   RxID:   [id number]

## 2010-05-06 NOTE — Progress Notes (Signed)
Summary: FYI: Having open heart surgery  Phone Note Call from Patient Call back at Home Phone [phone]   Caller: Spouse Call For: [name] MD Summary of Call: Patient's wife called to let [name] [name] know that he is at [facility] as of now having open heart surgery.   Initial call taken by: [name] [name] [name] [name] [name]),  May 04, 2009 12:27 PM  Follow-up for Phone Call        thank you for the update - please send him my best -- and send for hosp d/c summary when it is availible  Follow-up by: [name] MD,  May 04, 2009 12:37 PM  Additional Follow-up for Phone Call Additional follow up Details #1::        [facility] asking wife to call.            [name] [name]  May 04, 2009 12:42 PM     Additional Follow-up for Phone Call Additional follow up Details #2::    Dr [name] [name] referred the patient to [facility] last week . He had a Cath and then did  triple bypass and also did a valve replacement. [name] [name] does not know when he will getting out of the hospital. She will call you and keep you updated. Follow-up by: [name] [name],  May 05, 2009 9:12 AM

## 2010-05-06 NOTE — Medication Information (Signed)
Summary: rov/sp  Anticoagulant Therapy  Managed by: Weston Brass, PharmD PCP: [name] Supervising MD: Eden Emms MD, Theron Arista Indication 1: Atrial Fibrillation Lab Used: [facility] [place] Site: [address] INR POC 4.2 INR RANGE 2-3  Dietary changes: no    Health status changes: yes       Details: pending cardioversion next Friday   Bleeding/hemorrhagic complications: no    Recent/future hospitalizations: no    Any changes in medication regimen? no    Recent/future dental: no  Any missed doses?: no       Is patient compliant with meds? yes       Allergies: 1)  ! Pcn  Anticoagulation Management History:      The patient is taking warfarin and comes in today for a routine follow up visit.  Positive risk factors for bleeding include an age of 75 years or older and presence of serious comorbidities.  The bleeding index is 'intermediate risk'.  Positive CHADS2 values include History of HTN and Age > 54 years old.  Anticoagulation responsible provider: Eden Emms MD, Theron Arista.  INR POC: 4.2.  Cuvette Lot#: [id number].  Exp: 11/2010.    Anticoagulation Management Assessment/Plan:      The patient's current anticoagulation dose is Warfarin sodium 2.5 mg tabs: Use as directed by Anticoagualtion Clinic.  The target INR is 2.0-3.0.  The next INR is due 09/08/2009.  Anticoagulation instructions were given to patient.  Results were reviewed/authorized by Weston Brass, PharmD.  He was notified by Weston Brass PharmD.         Prior Anticoagulation Instructions: INR 3.0  Continue same dose of 1 tablet every day.   Current Anticoagulation Instructions: INR 4.2  Skip tomorrow's dose of Coumadin then decrease dose to 1 tablet every day except 1/2 tablet on Sunday and Thursday  Prescriptions: WARFARIN SODIUM 2.5 MG TABS (WARFARIN SODIUM) Use as directed by Anticoagualtion Clinic  #30 x 1   Entered by:   Sally Putt PharmD   Authorized by:   Peter Charles Nishan, MD, FACC   Signed by:    Sally Putt PharmD on 08/28/2009   Method used:   Electronically to        [facility]  [address] [id number]ail[facility]       [address]       [place], [place]  [address]       Ph: [phone] or [phone]       Fax: [phone]   RxID:   [id number]

## 2010-05-06 NOTE — Assessment & Plan Note (Signed)
Summary: f48m   Referring Provider:  Dr. Eden Emms Primary Provider:  Idamae Schuller Tower,MD  CC:  check up.  History of Present Illness: Adam [name] is seen today for F/U of dyspnea, afib, AVR, CAD.  We cardioverted him in June and he is maintaining NSR.  He has a history of diastolic dysfunction and intolerance to afib but unfortunately his dyspnea is minimally improved.  He has good LV function and a normal functioning AVR.  He has been on an adequate dose of Lasix for mildly elevated BNP with higher doses causing prerenal azotemia.   He is off amiodarone at this time.Marland Kitchen  His CXR shows diffuse interstitial lung disease and he had pre-existing COPD before his CABG/AVR at [facility] in March.  He finished cardiac rehab at [facility] and he may be a candidate for pulmonary rehab.  He had oxygen upon D/C from [facility] but doesn't think he needs it now.  He will get his INR checked today and again in 4 weeks.   He had a nice trip to [place] and actually fished and caught some flounder.  At this point I think it is clear that his dyspnea is more related to his lung disease   Interogation of his pacer shows 30 or so mode switches but longest only 12 seconds  Current Problems (verified): 1)  Bronchitis, Acute  (ICD-466.0) 2)  Allergic Rhinitis  (ICD-477.9) 3)  Interstitial Lung Disease  (ICD-515) 4)  Encounter For Long-term Use of Other Medications  (ICD-V58.69) 5)  Coumadin Therapy  (ICD-V58.61) 6)  Atrial Fibrillation, Paroxysmal  (ICD-427.31) 7)  Cardiac Pacemaker in Situ  (ICD-V45.01) 8)  Shortness of Breath  (ICD-786.05) 9)  Constipation  (ICD-564.00) 10)  Aortic Stenosis  (ICD-424.1) 11)  Premature Ventricular Contractions  (ICD-427.69) 12)  Syncope  (ICD-780.2) 13)  Arthritis  (ICD-716.90) 14)  Hx of Cad  (ICD-414.00) 15)  Hypertension  (ICD-401.9) 16)  Hyperlipidemia  (ICD-272.4) 17)  COPD  (ICD-496) 18)  Renal Insufficiency  (ICD-588.9) 19)  Carcinoma, Colon, Family Hx  (ICD-V16.0) 20)  Hiatal Hernia With  Reflux  (ICD-553.3) 21)  Esophageal Stricture  (ICD-530.3) 22)  Hemorrhoids, External  (ICD-455.3) 23)  Gouty Arthropathy  (ICD-274.0) 24)  Hx of Benign Prostatic Hypertrophy, Hx of  (ICD-V13.8) 25)  Hx of Allergy  (ICD-995.3) 26)  Hx of Venous Insufficiency  (ICD-459.81) 27)  Erectile Dysfunction  (ICD-302.72) 28)  Osteoarthritis  (ICD-715.90) 29)  Gerd  (ICD-530.81) 30)  Diverticulosis, Colon  (ICD-562.10) 31)  Lung Cancer, Hx of  (ICD-V10.11)  Current Medications (verified): 1)  Tylenol 8 Hour 650 Mg Tbcr (Acetaminophen) .... Take 3 By Mouth Daily As Needed 2)  Prevacid 30 Mg Cpdr (Lansoprazole) .... Take 1 Tablet By Mouth Two Times A Day 3)  Aspirin 81 Mg Tbec (Aspirin) .... Take One By Mouth Once Daily 4)  Senokot 8.6 Mg Tabs (Sennosides) .... As Needed 5)  Colchicine 0.6 Mg Tabs (Colchicine) .... One Tablet By Mouth Twice Daily As Needed For Gout 6)  Furosemide 40 Mg Tabs (Furosemide) .Marland Kitchen.. 1 Tab By Mouth By Mouth Daily 7)  Metoprolol Tartrate 50 Mg Tabs (Metoprolol Tartrate) .... Take One Tablet By Mouth Twice A Day 8)  Amiodarone Hcl 200 Mg Tabs (Amiodarone Hcl) .Marland Kitchen.. 1 Tab By Mouth Once Daily 9)  Colace 100 Mg Caps (Docusate Sodium) .... As Needed 10)  Zoloft 25 Mg Tabs (Sertraline Hcl) .Marland Kitchen.. 1 By Mouth Daily 11)  Warfarin Sodium 2.5 Mg Tabs (Warfarin Sodium) .... Use As Directed By Anticoagualtion Clinic 12)  Meloxicam  (Meloxicam) .... 3 or 4 Times Weekly 13)  Xopenex Hfa 45 Mcg/act Aero (Levalbuterol Tartrate) .... 2 Puffs Up To Four Times Daily As Needed 14)  Symbicort 160-4.5 Mcg/act Aero (Budesonide-Formoterol Fumarate) .... 2 Puffs Two Times A Day and Rinse Mouth Well After Use 15)  Benadryl 25 Mg Caps (Diphenhydramine Hcl) .... Otc As Directed.  Allergies (verified): 1)  ! Pcn  Past History:  Past Medical History: Last updated: 12/16/2008  Lung cancer, hx of  COPD  Diverticulosis, colon  GERD/ Stricture  Esophageal dysmotility  Hyperlipidemia   Hypertension Osteoarthritis  Padget's  aortic stenosis  mild renal insufficiency  erectile dysfunction venous insufficiency Gout BPH  Past Surgical History: Last updated: 07/01/2009 Aortic valve replacement with 25 mm CE bovine pericardial valve and cornary artery bypass graft x3 with left internal mammary artery to left anterior descending, saphenous vein graft to obutse marginal 2 and saphenous vein graft to posterior desending cornary arteries indivually on 05/04/09 Status post implantation of dual chamber pacemaker, [employer] on 05/08/09 Cholecystectomy Tonsillectomy Lung-lobectomy (1991) Prostate surgery (1990) Left thumb surgery Vein stripping Basal cell surgery- nose Stress test- pos.  AS on Echo, LVH (09/2001) EGD- stricture (07/207) Colonoscopy- diverticulosis, small polyp, hemorrhoids (03/2001) ,  diverticulosis, hemorrhoids (01/2003) Cardiolite- neg., mild-mod AS (11/2003) Carotids- neg (11/2005) EGD- gastritis, stricture, dysmotility (07/2004) Rib films- right 10th rib fracture (08/2005) Echo- stable (09/2005) 3/09 2D echo- mod aortic stenosis with EF 55%  Family History: Last updated: 10/16/2009 Father with CAD @ 97 mother with pancreatic ca sister with ?abdominal ca Colon Cancer: brother age 56- died  Heart disease---MGM  Social History: Last updated: 10/16/2009 quit smoking 1991 Patient is a former smoker.  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   Review of Systems       Denies fever, malais, weight loss, blurry vision, decreased visual acuity, cough, sputum, hemoptysis, pleuritic pain, palpitaitons, heartburn, abdominal pain, melena, lower extremity edema, claudication, or rash.   Vital Signs:  Patient profile:   77 year old male Height:      71 inches Weight:      169 pounds BMI:     23.66 Pulse rate:   69 / minute BP sitting:    103 / 59  (left arm)  Vitals Entered By: Kem Parkinson (April 23, 2010 11:51 AM)  Physical Exam  General:  Affect appropriate Healthy:  appears stated age HEENT: normal Neck supple with no adenopathy JVP normal no bruits no thyromegaly Lungs clear decreased brath sounds througout with no wheezing and good diaphragmatic motion Heart:  S1/S2 SEM AVR  murmur,rub, gallop or click PMI normal Abdomen: benighn, BS positve, no tenderness, no AAA no bruit.  No HSM or HJR Distal pulses intact with no bruits No edema Neuro non-focal Skin warm and dry S/P left thoracotomy   PPM Specifications Following MD:  Sherryl Manges, MD     Referring MD:  [facility] Vendor:  [employer]     PPM Model Number:  [id number]     PPM Serial Number:  [id number] PPM DOI:  05/08/2009     PPM Implanting MD:  NOT IMPLANTED HERE  Lead 1    Location: RA     DOI: 05/08/2009     Model #: [id number]     Serial #: [id number]     Status: active Lead 2    Location: RV  DOI: 05/08/2009     Model #: 7169     Serial #: [id number]     Status: active   Indications:  AFIB/CHB   PPM Follow Up Pacer Dependent:  No      Episodes Coumadin:  No  Parameters Mode:  DDDR     Lower Rate Limit:  70     Upper Rate Limit:  120 Paced AV Delay:  200     Sensed AV Delay:  150  Impression & Recommendations:  Problem # 1:  INTERSTITIAL LUNG DISEASE (ICD-515) Dyspnea primarily related to previous lung CA with LULobectomy, COPD.  F/U pulmonary  Problem # 2:  ATRIAL FIBRILLATION, PAROXYSMAL (ICD-427.31) Maint NSR.  Very symptomatic when in afib.  Continue low dose amiodarone despite lung disease His updated medication list for this problem includes:    Aspirin 81 Mg Tbec (Aspirin) .Marland Kitchen... Take one by mouth once daily    Metoprolol Tartrate 50 Mg Tabs (Metoprolol tartrate) .Marland Kitchen... Take one tablet by mouth twice a day    Amiodarone Hcl 200 Mg Tabs (Amiodarone hcl) .Marland Kitchen... 1 tab by mouth once daily    Warfarin Sodium 2.5 Mg Tabs (Warfarin sodium) .....  Use as directed by anticoagualtion clinic  Problem # 3:  CARDIAC PACEMAKER IN SITU (ICD-V45.01) Minimal mode switches.  Functioning normally   Problem # 4:  AORTIC STENOSIS (ICD-424.1) S/P tissue AVR Normal by echo.  SBE prophylaxis His updated medication list for this problem includes:    Furosemide 40 Mg Tabs (Furosemide) .Marland Kitchen... 1 tab by mouth by mouth daily    Metoprolol Tartrate 50 Mg Tabs (Metoprolol tartrate) .Marland Kitchen... Take one tablet by mouth twice a day  Problem # 5:  HYPERTENSION (ICD-401.9) Well controlled His updated medication list for this problem includes:    Aspirin 81 Mg Tbec (Aspirin) .Marland Kitchen... Take one by mouth once daily    Furosemide 40 Mg Tabs (Furosemide) .Marland Kitchen... 1 tab by mouth by mouth daily    Metoprolol Tartrate 50 Mg Tabs (Metoprolol tartrate) .Marland Kitchen... Take one tablet by mouth twice a day  Problem # 6:  HYPERLIPIDEMIA (ICD-272.4) D/C statin and lopid.  No studies suggesting benefit in 77 yo and patient wants to simplify meds The following medications were removed from the medication list:    Lipitor 10 Mg Tabs (Atorvastatin calcium) .Marland Kitchen... 1 tab op once daily    Lopid 600 Mg Tabs (Gemfibrozil) .Marland Kitchen... 1 tab by mouth once daily  Patient Instructions: 1)  Your physician has recommended you make the following change in your medication: STOP LIPITOR 2)  STOP GEMFIBROZIL 3)  Your physician wants you to follow-up in: 6 MONTHS  You will receive a reminder letter in the mail two months in advance. If you don't receive a letter, please call our office to schedule the follow-up appointment.

## 2010-05-06 NOTE — Progress Notes (Signed)
Summary: pt's dtr calling re cardioversion  Phone Note Call from Patient   Caller: Daughter june  until 4p [phone] or [phone] after4p Reason for Call: Talk to Nurse Summary of Call: pt's dtr june calling re pt's needs another cardiversion Initial call taken by: Glynda Jaeger,  April 06, 2010 1:38 PM  Follow-up for Phone Call        spoke with dtr, questions answered Deliah Goody, RN  April 06, 2010 5:47 PM\par

## 2010-05-06 NOTE — Medication Information (Signed)
Summary: rov/tm  Anticoagulant Therapy  Managed by: Bethena Midget, RN, BSN PCP: [name] Supervising MD: Shirlee Latch MD, Kaari Zeigler Indication 1: Atrial Fibrillation Lab Used: [facility] [place] Site: [address] INR POC 2.1 INR RANGE 2-3  Dietary changes: no    Health status changes: no    Bleeding/hemorrhagic complications: no    Recent/future hospitalizations: no    Any changes in medication regimen? yes       Details: Completed ABX on 04/19/10- Bactrim   Recent/future dental: no  Any missed doses?: no       Is patient compliant with meds? yes      Comments: Seeing Dr Eden Emms today.   Allergies: 1)  ! Pcn  Anticoagulation Management History:      The patient is taking warfarin and comes in today for a routine follow up visit.  Positive risk factors for bleeding include an age of 55 years or older and presence of serious comorbidities.  The bleeding index is 'intermediate risk'.  Positive CHADS2 values include History of HTN and Age > 62 years old.  His last INR was 2.5.  Anticoagulation responsible provider: Shirlee Latch MD, Random Dobrowski.  INR POC: 2.1.  Cuvette Lot#: [id number].  Exp: 04/2011.    Anticoagulation Management Assessment/Plan:      The patient's current anticoagulation dose is Warfarin sodium 2.5 mg tabs: Use as directed by Anticoagualtion Clinic.  The target INR is 2.0-3.0.  The next INR is due 05/21/2010.  Anticoagulation instructions were given to patient/spouse.  Results were reviewed/authorized by Bethena Midget, RN, BSN.  He was notified by Bethena Midget, RN, BSN.         Prior Anticoagulation Instructions: INR 4.0 Skip Wednesday's dose( Tomorrow) , then change Thursdays and Saturdays dose to 1/2 pill while on Antibiotic and all other days remain 1 pill everyday except 1.5 pill on next Wednesday.   Current Anticoagulation Instructions: INR 2.1 Continue 1 pill everyday except 1.5 pills on Wednesdays. Recheck in 4  weeks.

## 2010-05-06 NOTE — Letter (Signed)
Summary: [facility]   Imported By: Kassie Mends 07/03/2009 09:29:40  _____________________________________________________________________  External Attachment:    Type:   Image     Comment:   External Document

## 2010-05-06 NOTE — Medication Information (Signed)
Summary: rov/sp  Anticoagulant Therapy  Managed by: Weston Brass, PharmD PCP: [name] Supervising MD: Graciela Husbands MD, Viviann Spare Indication 1: Atrial Fibrillation Lab Used: [facility] [place] Site: [address] INR POC 3.0 INR RANGE 2-3  Dietary changes: no    Health status changes: no    Bleeding/hemorrhagic complications: no    Recent/future hospitalizations: no    Any changes in medication regimen? no    Recent/future dental: no  Any missed doses?: no       Is patient compliant with meds? yes       Allergies: 1)  ! Pcn  Anticoagulation Management History:      The patient is taking warfarin and comes in today for a routine follow up visit.  Positive risk factors for bleeding include an age of 75 years or older and presence of serious comorbidities.  The bleeding index is 'intermediate risk'.  Positive CHADS2 values include History of HTN and Age > 75 years old.  Anticoagulation responsible provider: Graciela Husbands MD, Viviann Spare.  INR POC: 3.0.  Cuvette Lot#: [id number].  Exp: 11/2010.    Anticoagulation Management Assessment/Plan:      The patient's current anticoagulation dose is Warfarin sodium 2.5 mg tabs: Use as directed by Anticoagualtion Clinic.  The next INR is due 08/28/2009.  Anticoagulation instructions were given to patient.  Results were reviewed/authorized by Weston Brass, PharmD.  He was notified by Weston Brass PharmD.         Prior Anticoagulation Instructions: INR 2.8  Continue same dose of 1 tablet (2.5mg ) daily.   Current Anticoagulation Instructions: INR 3.0  Continue same dose of 1 tablet every day.

## 2010-05-06 NOTE — Letter (Signed)
Summary: Physician's Orders  Physician's Orders   Imported By: Debby Freiberg 09/25/2009 09:52:01  _____________________________________________________________________  External Attachment:    Type:   Image     Comment:   External Document

## 2010-05-06 NOTE — Medication Information (Signed)
Summary: coumadin ck/mt  Anticoagulant Therapy  Managed by: Reina Fuse, PharmD PCP: [name] Supervising MD: Tenny Craw MD, Gunnar Fusi Indication 1: Atrial Fibrillation Lab Used: [facility] [place] Site: [address] INR POC 1.7 INR RANGE 2-3  Dietary changes: no    Health status changes: no    Bleeding/hemorrhagic complications: no    Recent/future hospitalizations: no    Any changes in medication regimen? no    Recent/future dental: no  Any missed doses?: no       Is patient compliant with meds? yes      Comments: Discussed keeping dietary vitamin K consistent and signs/symptoms of bleeding with daughter and pt.   Current Medications (verified): 1)  Tylenol 8 Hour 650 Mg Tbcr (Acetaminophen) .... Take 3 By Mouth Daily 2)  Prevacid 30 Mg Cpdr (Lansoprazole) .... Take 1 Tablet By Mouth Two Times A Day 3)  Aspirin 81 Mg Tbec (Aspirin) .... Take One By Mouth Once Daily 4)  Senokot 8.6 Mg Tabs (Sennosides) .... As Needed 5)  Colchicine 0.6 Mg Tabs (Colchicine) .... One Tablet By Mouth Twice Daily As Needed For Gout 6)  Furosemide 40 Mg Tabs (Furosemide) .Marland Kitchen.. 1 Tab By Mouth By Mouth Daily 7)  Metoprolol Tartrate 50 Mg Tabs (Metoprolol Tartrate) .... Take One Tablet By Mouth Twice A Day 8)  Lipitor 10 Mg Tabs (Atorvastatin Calcium) .Marland Kitchen.. 1 Tab Op Once Daily 9)  Lopid 600 Mg Tabs (Gemfibrozil) .Marland Kitchen.. 1 Tab By Mouth Once Daily 10)  Amiodarone Hcl 200 Mg Tabs (Amiodarone Hcl) .Marland Kitchen.. 1 Tab By Mouth Once Daily 11)  Colace 100 Mg Caps (Docusate Sodium) .... As Needed 12)  Zoloft 25 Mg Tabs (Sertraline Hcl) .... 1/2  Tab By Mouth Once Daily 13)  Warfarin Sodium 2.5 Mg Tabs (Warfarin Sodium) .... Use As Directed By Anticoagualtion Clinic 14)  Meloxicam  (Meloxicam) .... 3 or 4 Times Weekly 15)  Benadryl 25 Mg Tabs (Diphenhydramine Hcl) .Marland Kitchen.. 1 Tab By Mouth Once Daily 16)  Xopenex Hfa 45 Mcg/act Aero (Levalbuterol Tartrate) .... 2 Puffs Up To Four Times Daily As  Needed  Allergies (verified): 1)  ! Pcn  Anticoagulation Management History:      The patient is taking warfarin and comes in today for a routine follow up visit.  Positive risk factors for bleeding include an age of 75 years or older and presence of serious comorbidities.  The bleeding index is 'intermediate risk'.  Positive CHADS2 values include History of HTN and Age > 75 years old.  His last INR was 2.5.  Anticoagulation responsible provider: Tenny Craw MD, Gunnar Fusi.  INR POC: 1.7.  Cuvette Lot#: [id number].  Exp: 02/2011.    Anticoagulation Management Assessment/Plan:      The patient's current anticoagulation dose is Warfarin sodium 2.5 mg tabs: Use as directed by Anticoagualtion Clinic.  The target INR is 2.0-3.0.  The next INR is due 02/10/2010.  Anticoagulation instructions were given to patient/spouse.  Results were reviewed/authorized by Reina Fuse, PharmD.  He was notified by Reina Fuse PharmD.         Prior Anticoagulation Instructions: INR 1.5 Today take extra 1/2 pill and tomorrow take 1 1/2 pill then change dose to 1 pill everyday. Recheck in 2 weeks.   Current Anticoagulation Instructions: INR 1.7  Tomorrow, Saturday, October 22nd, take Coumadin 2 tabs (5 mg). Then, start taking Coumadin 1 tab (2.5 mg) on all days except Coumadin 1.5 tabs (3.75 mg) on Wednesdays. Return to clinic on 11/9 to coincide with  doctor's appointment.

## 2010-05-06 NOTE — Progress Notes (Signed)
Summary: NEED TO SPEAK WITH NURSE ABOUT PT B/P  Phone Note Call from Patient Call back at [phone]   Caller: Daughter/ JUNE Summary of Call: PT CALLING TO SPEAK WITH NURSE ABOUT THE PT B/P Initial call taken by: Judie Grieve,  July 20, 2009 2:49 PM  Follow-up for Phone Call        spoke with dtr, pt went to cardiac rehab today and his bp was low.  86/62-92/59. dtr was concerned. dtr states he has no edema or SOB at present and she thinks he is really dry. okay given to decrease furosemide to 40mg  once daily. she was also concerned because for the last 10 days he is having trouble with swollowing. it sounds like reflux or his hital hernia, she will contact his GI doctor. pt to call with further problems Deliah Goody, RN  July 20, 2009 3:36 PM

## 2010-05-06 NOTE — Medication Information (Signed)
Summary: rov/jb  Anticoagulant Therapy  Managed by: Eda Keys, PharmD PCP: [name] Supervising MD: Tenny Craw MD, Gunnar Fusi Indication 1: Atrial Fibrillation Lab Used: [facility] [place] Site: [address] INR POC 1.5 INR RANGE 2-3  Dietary changes: no    Health status changes: no    Bleeding/hemorrhagic complications: no    Recent/future hospitalizations: no    Any changes in medication regimen? no    Recent/future dental: no  Any missed doses?: no       Is patient compliant with meds? yes       Allergies: 1)  ! Pcn  Anticoagulation Management History:      The patient is taking warfarin and comes in today for a routine follow up visit.  Positive risk factors for bleeding include an age of 29 years or older and presence of serious comorbidities.  The bleeding index is 'intermediate risk'.  Positive CHADS2 values include History of HTN and Age > 76 years old.  His last INR was 2.5.  Anticoagulation responsible Rosalie Buenaventura: Tenny Craw MD, Gunnar Fusi.  INR POC: 1.5.  Cuvette Lot#: [id number].  Exp: 02/2011.    Anticoagulation Management Assessment/Plan:      The patient's current anticoagulation dose is Warfarin sodium 2.5 mg tabs: Use as directed by Anticoagualtion Clinic.  The target INR is 2.0-3.0.  The next INR is due 12/25/2009.  Anticoagulation instructions were given to patient/spouse.  Results were reviewed/authorized by Eda Keys, PharmD.  He was notified by Eda Keys, PharmD.         Prior Anticoagulation Instructions: INR 2.5  Continue same regimen  Follow up in 1 month  Current Anticoagulation Instructions: INR 1.5  Take an extra 1/2 tablet today.  Then return to normal dosing schedule of 1 tablet every other day.  Return to clinic in 2 weeks.

## 2010-05-06 NOTE — Medication Information (Signed)
Summary: afib//started 4/29/ 2.5mg Rip Harbour per [place]/saf  Anticoagulant Therapy  Managed by: Charolotte Eke, PharmD PCP: [name] Supervising MD: Shirlee Latch MD, Roshini Fulwider INR POC 2.5 INR RANGE 2-3  Dietary changes: no    Health status changes: no    Bleeding/hemorrhagic complications: no    Recent/future hospitalizations: no    Any changes in medication regimen? no    Recent/future dental: no  Any missed doses?: no       Is patient compliant with meds? yes       Current Medications (verified): 1)  Tylenol 8 Hour 650 Mg Tbcr (Acetaminophen) .... Take 3 By Mouth Daily 2)  Prevacid 30 Mg Cpdr (Lansoprazole) .... Take 1 Tablet By Mouth Two Times A Day 3)  Ambien 10 Mg  Tabs (Zolpidem Tartrate) .Marland Kitchen.. 1 By Mouth At Bedtime As Needed Insomnia 4)  Aspirin 81 Mg Tbec (Aspirin) .... Take One By Mouth Once Daily 5)  Senokot 8.6 Mg Tabs (Sennosides) .... As Needed 6)  Colchicine 0.6 Mg Tabs (Colchicine) .... One Tablet By Mouth Twice Daily As Needed For Gout 7)  Furosemide 40 Mg Tabs (Furosemide) .Marland Kitchen.. 1 Tab By Mouth By Mouth Daily 8)  Metoprolol Tartrate 50 Mg Tabs (Metoprolol Tartrate) .... Take One Tablet By Mouth Twice A Day 9)  Lipitor 10 Mg Tabs (Atorvastatin Calcium) .Marland Kitchen.. 1 Tab Op Once Daily 10)  Lopid 600 Mg Tabs (Gemfibrozil) .Marland Kitchen.. 1 Tab By Mouth Once Daily 11)  Amiodarone Hcl 200 Mg Tabs (Amiodarone Hcl) .Marland Kitchen.. 1 Tab By Mouth Once Daily 12)  Colace 100 Mg Caps (Docusate Sodium) .... As Needed 13)  Zoloft 25 Mg Tabs (Sertraline Hcl) .... 1/2  Tab By Mouth Once Daily 14)  Warfarin Sodium 2.5 Mg Tabs (Warfarin Sodium) .... Use As Directed By Anticoagualtion Clinic  Allergies (verified): 1)  ! Pcn  Anticoagulation Management History:      The patient is taking warfarin and comes in today for a routine follow up visit.  Positive risk factors for bleeding include an age of 53 years or older and presence of serious comorbidities.  The bleeding index is 'intermediate risk'.  Positive CHADS2  values include History of HTN and Age > 25 years old.  Anticoagulation responsible provider: Shirlee Latch MD, Lilygrace Rodick.  INR POC: 2.5.  Cuvette Lot#: [id number].  Exp: 09/03/2010.    Anticoagulation Management Assessment/Plan:      The patient's current anticoagulation dose is Warfarin sodium 2.5 mg tabs: Use as directed by Anticoagualtion Clinic.  The next INR is due 08/10/2009.  Results were reviewed/authorized by Charolotte Eke, PharmD.  He was notified by Charolotte Eke, PharmD.         Current Anticoagulation Instructions: The patient is to continue with the same dose of coumadin.  This dosage includes: 2.5mg  daily

## 2010-05-06 NOTE — Progress Notes (Signed)
Summary: Triage  Phone Note Call from Patient   Caller: daughter  [phone] Call For: Dr. Leone Payor Reason for Call: Talk to Nurse Summary of Call: Pt had heart surgery and now having GERD and acid reflux. Wants a sooner appt. than first avail. Initial call taken by: Karna Christmas,  July 20, 2009 4:18 PM  Follow-up for Phone Call        Daughter offered appt. with NP for tomorrow for her father but she is only off on Friday's and she has to drive him.Given appt. with NP for 07/31/09. Follow-up by: Teryl Lucy RN,  July 20, 2009 4:36 PM

## 2010-05-06 NOTE — Progress Notes (Signed)
Summary: medication questions   Phone Note Call from Patient Call back at [phone]   Caller: Daughter//june Call For: bynum Reason for Call: Talk to Nurse Summary of Call: Wants to talk to nurse about samples that her dad was given on last ov spiriva and xopenex. Initial call taken by: Darletta Moll,  October 27, 2009 9:36 AM  Follow-up for Phone Call        called and spoke with pts daughter---she stated that the pt has used the spriva x 1 week and now he is c/o of being very dry and dizziness the last 2 times he has used this....does he need to change to something else??  pt just stated he is ok to use the rescue inhaler as needed.  daughter stated that she did see his ct scan results and wanted to know if he needs to cont  the spriva??  please advise. thanks Randell Loop CMA  October 27, 2009 11:12 AM   Additional Follow-up for Phone Call Additional follow up Details #1::        We should stop Spiriva for now, continue the xopenex as needed. We can revisit the meds at our ROV Leslye Peer MD  October 30, 2009 11:53 AM  Additional Follow-up by: Leslye Peer MD,  October 30, 2009 11:53 AM    Additional Follow-up for Phone Call Additional follow up Details #2::    Called above number -- this number was a dentist office and message stated they were closed at this time.  WBC Crystal Jones RN  October 30, 2009 12:09 PM  Called above number -- this number was a dentist office and message stated they were closed but would reopen on Monday, Aug 1 at 7am.  [facility] RN  October 30, 2009 1:27 PM  Called pt's daughter's number in reg notes.  Spoke with June.  Informed her of above statement and recs per RB.  She verbalized understanding.  Gweneth Dimitri RN  October 30, 2009 1:29 PM

## 2010-05-06 NOTE — Medication Information (Signed)
Summary: Tax adviser   Imported By: Valinda Hoar 02/17/2010 17:31:29  _____________________________________________________________________  External Attachment:    Type:   Image     Comment:   External Document

## 2010-05-06 NOTE — Medication Information (Signed)
Summary: rov/tm  Anticoagulant Therapy  Managed by: Elaina Pattee, PharmD PCP: [name] Supervising MD: Juanda Chance MD, Joson Sapp Indication 1: Atrial Fibrillation Lab Used: [facility] [place] Site: [address] INR POC 1.9 INR RANGE 2-3           Allergies: 1)  ! Pcn  Anticoagulation Management History:      The patient is taking warfarin and comes in today for a routine follow up visit.  Positive risk factors for bleeding include an age of 37 years or older and presence of serious comorbidities.  The bleeding index is 'intermediate risk'.  Positive CHADS2 values include History of HTN and Age > 48 years old.  Anticoagulation responsible provider: Juanda Chance MD, Smitty Cords.  INR POC: 1.9.  Cuvette Lot#: [id number].  Exp: 11/2010.    Anticoagulation Management Assessment/Plan:      The patient's current anticoagulation dose is Warfarin sodium 2.5 mg tabs: Use as directed by Anticoagualtion Clinic.  The target INR is 2.0-3.0.  The next INR is due 09/28/2009.  Anticoagulation instructions were given to patient.  Results were reviewed/authorized by Elaina Pattee, PharmD.  He was notified by Elaina Pattee, PharmD.         Prior Anticoagulation Instructions: INR 2.4 Continue 2.5mg s everyday except 1.25mg s on Sundays and Thursdays. Recheck in 10 days.   Current Anticoagulation Instructions: INR 1.9. Take an extra 1/2 tablet today, then take 1 tablet daily except 0.5 tablet on Sun and Thurs. Recheck in 10-14 days.

## 2010-05-06 NOTE — Medication Information (Signed)
Summary: rov-tp  Anticoagulant Therapy  Managed by: Weston Brass, PharmD PCP: [name] Supervising MD: Eden Emms MD, Theron Arista Indication 1: Atrial Fibrillation Lab Used: [facility] [place] Site: [address] INR POC 2.8 INR RANGE 2-3  Dietary changes: no    Health status changes: no    Bleeding/hemorrhagic complications: no    Recent/future hospitalizations: no    Any changes in medication regimen? no    Recent/future dental: no  Any missed doses?: no       Is patient compliant with meds? yes       Allergies: 1)  ! Pcn  Anticoagulation Management History:      The patient is taking warfarin and comes in today for a routine follow up visit.  Positive risk factors for bleeding include an age of 75 years or older and presence of serious comorbidities.  The bleeding index is 'intermediate risk'.  Positive CHADS2 values include History of HTN and Age > 44 years old.  Anticoagulation responsible provider: Eden Emms MD, Theron Arista.  INR POC: 2.8.  Cuvette Lot#: [id number].  Exp: 11/2010.    Anticoagulation Management Assessment/Plan:      The patient's current anticoagulation dose is Warfarin sodium 2.5 mg tabs: Use as directed by Anticoagualtion Clinic.  The next INR is due 08/18/2009.  Anticoagulation instructions were given to patient.  Results were reviewed/authorized by Weston Brass, PharmD.  He was notified by Weston Brass PharmD.         Prior Anticoagulation Instructions: The patient is to continue with the same dose of coumadin.  This dosage includes: 2.5mg  daily  Current Anticoagulation Instructions: INR 2.8  Continue same dose of 1 tablet (2.5mg ) daily.

## 2010-05-06 NOTE — Cardiovascular Report (Signed)
Summary: [facility] Cardiac Cath Lab  [facility] Cardiac Cath Lab   Imported By: Roderic Ovens 07/23/2009 10:32:32  _____________________________________________________________________  External Attachment:    Type:   Image     Comment:   External Document

## 2010-05-06 NOTE — Assessment & Plan Note (Signed)
Summary: MED REFILLS/RI   Vital Signs:  Patient profile:   77 year old male Height:      71 inches Weight:      176.25 pounds BMI:     24.67 O2 Sat:      86 % on Room air Temp:     98.2 degrees F oral Pulse rate:   70 / minute Pulse rhythm:   irregular BP sitting:   102 / 64  (left arm) Cuff size:   regular  Vitals Entered By: Lewanda Rife LPN (April 09, 2010 10:51 AM)  O2 Flow:  Room air CC: productive cough with clear & yellowish phlegm. Med refills. Comments Since pulse ox 86% offered pt O2 at 2L with nasal canula.   History of Present Illness: here for f/u of chronic med problems incl HTN and lipids and renal insuff  follows copd from asbestos and past smoking (with fairly good lung functoiin)  and interstitial lung dz with Dr Delton Coombes with baseline pulse ox 88-89% had brief 02 at home    due for lipid check  HTN in good control 102/64  wt is up 3 lb   (lost 30 lb with last surgery)   has prod cough with yellow spubum   is on amiodarone  had cardioversion in feb and june -- for afib  energy level is falling in general   started cough since new years (was traveling ) -- exp to children  is productive , sounds junky  can hear a little wheeze occas  using symbacort  carries xeopenex -- not using more than usual - ? may need it  some runny nose - this is common  no fever  no sore throat or ear pain       Allergies: 1)  ! Pcn  Past History:  Past Medical History: Last updated: 12/16/2008  Lung cancer, hx of  COPD  Diverticulosis, colon  GERD/ Stricture  Esophageal dysmotility  Hyperlipidemia  Hypertension Osteoarthritis  Padget's  aortic stenosis  mild renal insufficiency  erectile dysfunction venous insufficiency Gout BPH  Past Surgical History: Last updated: 07/01/2009 Aortic valve replacement with 25 mm CE bovine pericardial valve and cornary artery bypass graft x3 with left internal mammary artery to left anterior descending, saphenous  vein graft to obutse marginal 2 and saphenous vein graft to posterior desending cornary arteries indivually on 05/04/09 Status post implantation of dual chamber pacemaker, [employer] on 05/08/09 Cholecystectomy Tonsillectomy Lung-lobectomy (1991) Prostate surgery (1990) Left thumb surgery Vein stripping Basal cell surgery- nose Stress test- pos.  AS on Echo, LVH (09/2001) EGD- stricture (07/207) Colonoscopy- diverticulosis, small polyp, hemorrhoids (03/2001) ,  diverticulosis, hemorrhoids (01/2003) Cardiolite- neg., mild-mod AS (11/2003) Carotids- neg (11/2005) EGD- gastritis, stricture, dysmotility (07/2004) Rib films- right 10th rib fracture (08/2005) Echo- stable (09/2005) 3/09 2D echo- mod aortic stenosis with EF 55%  Family History: Last updated: 10/16/2009 Father with CAD @ 68 mother with pancreatic ca sister with ?abdominal ca Colon Cancer: brother age 36- died  Heart disease---MGM  Social History: Last updated: 10/16/2009 quit smoking 1991 Patient is a former smoker.  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   Risk Factors: Smoking Status: quit (11/27/2009) Packs/Day: 0.75 (11/27/2009)  Review of Systems General:  Complains of fatigue; denies chills, fever, loss of appetite, and malaise. Eyes:  Denies blurring and eye irritation. ENT:  Complains of postnasal drainage; denies nasal  congestion, sinus pressure, and sore throat. CV:  Denies chest pain or discomfort and palpitations. Resp:  Complains of cough, shortness of breath, and sputum productive; denies pleuritic and wheezing. GI:  Denies abdominal pain, change in bowel habits, indigestion, nausea, and vomiting. GU:  Denies dysuria. Derm:  Denies poor wound healing and rash. Neuro:  Denies falling down and headaches. Psych:  keeps a good attitude . Endo:  Denies cold intolerance, excessive thirst,  excessive urination, and heat intolerance. Heme:  Denies abnormal bruising and bleeding.  Physical Exam  General:  frail appearing elderly male  wet sounding cough  sob on exertion   Head:  normocephalic, atraumatic, and no abnormalities observed.   Eyes:  vision grossly intact, pupils equal, pupils round, and pupils reactive to light.  no conjunctival pallor, injection or icterus  Mouth:  pharynx pink and moist.   Neck:  supple with full rom and no masses or thyromegally, no JVD or carotid bruit  (heart M does rad to both carotids) Chest Wall:  No deformities, masses, tenderness or gynecomastia noted. Lungs:  diffusely distant bs with some faint rhonchi at bases no wheeze even on forced exp  no rales  is sob on exertion  feels better with 2l of 02 today in the office  Heart:  RRR with holosystolic M  Abdomen:  Bowel sounds positive,abdomen soft and non-tender without masses, organomegaly or hernias noted. no suprapubic tenderness or fullness felt  Msk:  No deformity or scoliosis noted of thoracic or lumbar spine.   Pulses:  plus one pedal pulses Extremities:  varicosities are present below knee bilat Neurologic:  sensation intact to light touch, gait normal, and DTRs symmetrical and normal.   Skin:  Intact without suspicious lesions or rashes Cervical Nodes:  No lymphadenopathy noted Psych:  normal affect, talkative and pleasant    Impression & Recommendations:  Problem # 1:  BRONCHITIS, ACUTE (ICD-466.0) in pt with already comprimised lung status (copd and interstitial dz)  pulse ox was 86% at rest, 89% on 2L of 02 and went down to 77 % when ambulating without 02  cover with low dose bactrim in light of renal insuf  pulm f/u 2 wk inr mon or tues in light of abx  home 02- 2l by nasal cannula for 2 weeks to get through illness His updated medication list for this problem includes:    Xopenex Hfa 45 Mcg/act Aero (Levalbuterol tartrate) .Marland Kitchen... 2 puffs up to four times daily as  needed    Symbicort 160-4.5 Mcg/act Aero (Budesonide-formoterol fumarate) .Marland Kitchen... 2 puffs two times a day and rinse mouth well after use    Bactrim 400-80 Mg Tabs (Sulfamethoxazole-trimethoprim) .Marland Kitchen... 1 by mouth two times a day for 10 days  Orders: Home Health Referral [facility]) Prescription Created Electronically [phone])  Problem # 2:  HYPERCHOLESTEROLEMIA (ICD-272.0) Assessment: Unchanged  lab today- is due disc goals for heart dz  His updated medication list for this problem includes:    Lipitor 10 Mg Tabs (Atorvastatin calcium) .Marland Kitchen... 1 tab op once daily    Lopid 600 Mg Tabs (Gemfibrozil) .Marland Kitchen... 1 tab by mouth once daily  Labs Reviewed: SGOT: 16 (02/10/2010)   SGPT: 10 (02/10/2010)   HDL:38.10 (10/28/2008), 32.60 (08/25/2008)  LDL:DEL (01/09/2008), DEL (10/09/2007)  Chol:199 (10/28/2008), 180 (08/25/2008)  Trig:300.0 (10/28/2008), 309.0 (08/25/2008)  Problem # 3:  RENAL INSUFFICIENCY (ICD-588.9) Assessment: Unchanged lab today per pt much imp after d/c from hosp no urinary symptoms rev his meds  Orders: Venipuncture (52841) TLB-Lipid  Panel (80061-LIPID) TLB-Renal Function Panel (80069-RENAL) TLB-ALT (SGPT) (84460-ALT) TLB-AST (SGOT) (84450-SGOT)  Complete Medication List: 1)  Tylenol 8 Hour 650 Mg Tbcr (Acetaminophen) .... Take 3 by mouth daily as needed 2)  Prevacid 30 Mg Cpdr (Lansoprazole) .... Take 1 tablet by mouth two times a day 3)  Aspirin 81 Mg Tbec (Aspirin) .... Take one by mouth once daily 4)  Senokot 8.6 Mg Tabs (Sennosides) .... As needed 5)  Colchicine 0.6 Mg Tabs (Colchicine) .... One tablet by mouth twice daily as needed for gout 6)  Furosemide 40 Mg Tabs (Furosemide) .Marland Kitchen.. 1 tab by mouth by mouth daily 7)  Metoprolol Tartrate 50 Mg Tabs (Metoprolol tartrate) .... Take one tablet by mouth twice a day 8)  Lipitor 10 Mg Tabs (Atorvastatin calcium) .Marland Kitchen.. 1 tab op once daily 9)  Lopid 600 Mg Tabs (Gemfibrozil) .Marland Kitchen.. 1 tab by mouth once daily 10)  Amiodarone  Hcl 200 Mg Tabs (Amiodarone hcl) .Marland Kitchen.. 1 tab by mouth once daily 11)  Colace 100 Mg Caps (Docusate sodium) .... As needed 12)  Zoloft 25 Mg Tabs (Sertraline hcl) .Marland Kitchen.. 1 by mouth daily 13)  Warfarin Sodium 2.5 Mg Tabs (Warfarin sodium) .... Use as directed by anticoagualtion clinic 14)  Meloxicam (meloxicam)  .... 3 or 4 times weekly 15)  Xopenex Hfa 45 Mcg/act Aero (Levalbuterol tartrate) .... 2 puffs up to four times daily as needed 16)  Symbicort 160-4.5 Mcg/act Aero (Budesonide-formoterol fumarate) .... 2 puffs two times a day and rinse mouth well after use 17)  Benadryl 25 Mg Caps (Diphenhydramine hcl) .... Otc as directed. 18)  Bactrim 400-80 Mg Tabs (Sulfamethoxazole-trimethoprim) .Marland Kitchen.. 1 by mouth two times a day for 10 days  Other Orders: Pulmonary Referral (Pulmonary)  Patient Instructions: 1)  move next protime up to mon or tues at [facility]  2)  take bactrim two times a day for 10 days 3)  we will schedule pulmonary referral for re check in 2 weeks for hypoxia  4)  if worse cough or other symptoms - let me know  5)  we will refer for short term oxygen at check out  Prescriptions: BACTRIM 400-80 MG TABS (SULFAMETHOXAZOLE-TRIMETHOPRIM) 1 by mouth two times a day for 10 days  #20 x 0   Entered and Authorized by:   Judith Part MD   Signed by:   Judith Part MD on 04/09/2010   Method used:   [facility]        [facility]  [address] [id number]       [address]       [place], [place]  [address]       Ph: [phone] or [phone]       Fax: [phone]   RxID:   [id number]    Orders Added: 1)  Venipuncture [56387] 2)  TLB-Lipid Panel [80061-LIPID] 3)  TLB-Renal Function Panel [80069-RENAL] 4)  TLB-ALT (SGPT) [84460-ALT] 5)  TLB-AST (SGOT) [84450-SGOT] 6)  Pulmonary Referral [Pulmonary] 7)  Home Health Referral [facility]] 8)  Prescription Created Electronically [G8553] 9)  Est. Patient Level IV [56433]    Current Allergies (reviewed today): !  PCN

## 2010-05-06 NOTE — Medication Information (Signed)
Summary: rov/tm  Anticoagulant Therapy  Managed by: Weston Brass, PharmD PCP: [name] Supervising MD: Tenny Craw MD, Gunnar Fusi Indication 1: Atrial Fibrillation Lab Used: [facility] [place] Site: [address] INR POC 1.8 INR RANGE 2-3  Dietary changes: no    Health status changes: no    Bleeding/hemorrhagic complications: no    Recent/future hospitalizations: no    Any changes in medication regimen? no    Recent/future dental: no  Any missed doses?: no       Is patient compliant with meds? yes       Allergies: 1)  ! Pcn  Anticoagulation Management History:      The patient is taking warfarin and comes in today for a routine follow up visit.  Positive risk factors for bleeding include an age of 75 years or older and presence of serious comorbidities.  The bleeding index is 'intermediate risk'.  Positive CHADS2 values include History of HTN and Age > 49 years old.  Anticoagulation responsible provider: Tenny Craw MD, Gunnar Fusi.  INR POC: 1.8.  Cuvette Lot#: [id number].  Exp: 12/2010.    Anticoagulation Management Assessment/Plan:      The patient's current anticoagulation dose is Warfarin sodium 2.5 mg tabs: Use as directed by Anticoagualtion Clinic.  The target INR is 2.0-3.0.  The next INR is due 10/26/2009.  Anticoagulation instructions were given to patient/spouse.  Results were reviewed/authorized by Weston Brass, PharmD.  He was notified by Weston Brass PharmD.         Prior Anticoagulation Instructions: INR 1.7 Today take extra 1/2 tablet then change dose  to 1 tablet everyday except 1/2 tablet on Sundays. Recheck in 2 weeks.   Current Anticoagulation Instructions: INR 1.8  Take an extra 1/2 tablet today then resume same dose of 1 tablet every day except 1/2 tablet on Sunday.

## 2010-05-06 NOTE — Letter (Signed)
Summary: [facility] Thoracic Surgery Clinic Note  [facility] Thoracic Surgery Clinic Note   Imported By: Roderic Ovens 06/25/2009 14:33:40  _____________________________________________________________________  External Attachment:    Type:   Image     Comment:   External Document

## 2010-05-06 NOTE — Medication Information (Signed)
Summary: rov/ln  Anticoagulant Therapy  Managed by: Samantha Crimes, PharmD PCP: Julien Nordmann Supervising MD: Shirlee Latch MD, [place] Indication 1: Atrial Fibrillation Lab Used: [facility] [place] Site: [address] INR POC 2.5 INR RANGE 2-3  Dietary changes: no    Health status changes: no    Bleeding/hemorrhagic complications: no    Recent/future hospitalizations: no    Any changes in medication regimen? no    Recent/future dental: no  Any missed doses?: no       Is patient compliant with meds? yes       Current Medications (verified): 1)  Tylenol 8 Hour 650 Mg Tbcr (Acetaminophen) .... Take 3 By Mouth Daily 2)  Prevacid 30 Mg Cpdr (Lansoprazole) .... Take 1 Tablet By Mouth Two Times A Day 3)  Aspirin 81 Mg Tbec (Aspirin) .... Take One By Mouth Once Daily 4)  Senokot 8.6 Mg Tabs (Sennosides) .... As Needed 5)  Colchicine 0.6 Mg Tabs (Colchicine) .... One Tablet By Mouth Twice Daily As Needed For Gout 6)  Furosemide 40 Mg Tabs (Furosemide) .Marland Kitchen.. 1 Tab By Mouth By Mouth Daily 7)  Metoprolol Tartrate 50 Mg Tabs (Metoprolol Tartrate) .... Take One Tablet By Mouth Twice A Day 8)  Lipitor 10 Mg Tabs (Atorvastatin Calcium) .Marland Kitchen.. 1 Tab Op Once Daily 9)  Lopid 600 Mg Tabs (Gemfibrozil) .Marland Kitchen.. 1 Tab By Mouth Once Daily 10)  Amiodarone Hcl 200 Mg Tabs (Amiodarone Hcl) .Marland Kitchen.. 1 Tab By Mouth Once Daily 11)  Colace 100 Mg Caps (Docusate Sodium) .... As Needed 12)  Zoloft 25 Mg Tabs (Sertraline Hcl) .... 1/2  Tab By Mouth Once Daily 13)  Warfarin Sodium 2.5 Mg Tabs (Warfarin Sodium) .... Use As Directed By Anticoagualtion Clinic 14)  Meloxicam  (Meloxicam) .... 3 or 4 Times Weekly 15)  Benadryl 25 Mg Tabs (Diphenhydramine Hcl) .Marland Kitchen.. 1 Tab By Mouth Once Daily  Allergies (verified): 1)  ! Pcn  Anticoagulation Management History:      Positive risk factors for bleeding include an age of 32 years or older and presence of serious comorbidities.  The bleeding index is 'intermediate risk'.   Positive CHADS2 values include History of HTN and Age > 39 years old.  Today's INR is 2.5.  Anticoagulation responsible provider: Shirlee Latch MD, [place].  INR POC: 2.5.  Exp: 01/2011.    Anticoagulation Management Assessment/Plan:      The patient's current anticoagulation dose is Warfarin sodium 2.5 mg tabs: Use as directed by Anticoagualtion Clinic.  The target INR is 2.0-3.0.  The next INR is due 12/11/2009.  Anticoagulation instructions were given to patient/spouse.  Results were reviewed/authorized by Samantha Crimes, PharmD.  He was notified by Samantha Crimes PharmD.         Prior Anticoagulation Instructions: INR 2.3  Continue same dose of 1 tab daily except for 1/2 tab on Sunday.  Re-check on 8/15.  Current Anticoagulation Instructions: INR 2.5  Continue same regimen  Follow up in 1 month

## 2010-05-06 NOTE — Progress Notes (Signed)
  Phone Note From Other Clinic   Caller: Referral Coordinator Summary of Call: [facility] PT Dept  called back when they received our order for PT on Adam [name]. They saw that he recently had Heart Surgery and said that he needs Cardiac Rehab not regular PT, so they said the surgeon who did hhis surgery should be the ordering Dr for this. I called [name] his wife and gave her the information she would need to get the order from the surgeon in [place]. She will call them to get this scheduled with [facility].  Initial call taken by: Carlton Adam,  June 17, 2009 5:07 PM  Follow-up for Phone Call        thanks for the update- I agree Follow-up by: Judith Part MD,  June 17, 2009 5:24 PM

## 2010-05-06 NOTE — Progress Notes (Signed)
Summary: REFILL  Phone Note Refill Request Message from:  Patient on April 27, 2009 12:36 PM  Refills Requested: Medication #1:  MELOXICAM 7.5 MG TABS one tablet by mouth once daily as needed [facility]ENT [phone]  Initial call taken by: Judie Grieve,  April 27, 2009 12:38 PM    Prescriptions: MELOXICAM 7.5 MG TABS (MELOXICAM) one tablet by mouth once daily as needed  #90 x 3   Entered by:   Kem Parkinson   Authorized by:   Colon Branch, MD, [facility]   Signed by:   Kem Parkinson on 04/27/2009   Method used:   [facility]        [facility]  [address] [facility]retail)       [address]       [place], [place]  [address]       Ph: [phone] or [phone]       Fax: [phone]   RxID:   [id number]

## 2010-05-06 NOTE — Assessment & Plan Note (Signed)
Summary: COPD, restriction, hypoxemia   Visit Type:  Follow-up Copy to:  Dr. Eden Emms Primary Provider/Referring Provider:  [name]  CC:  has .  History of Present Illness: 77 yo man, former smoker and hx of asbestos exposure, Hx of lung CA s/p LLL lobectomy in 1991, CAD and AV disease s/p CABG/AVR in 04/2009. He also has Afib s/p pacer and successful cardioversion on June 3. He has been on home O2 since his SGY, imaging has shown ? ILD. He also had COPD documented prior to SGY.   ROV 11/26/09 -- returns for f/u of multifactorial SOB, with COPD and restriction post lobectomy as contributors. Since last visit has had CT scan of the chest - no ILD, some pleural thickening, some emphysematous changes. We did trial of Spiriva, only took it for 3 days due to dizziness. He thinks his breathing did benefit some while he was on the med. Has been doing IS more frequently.   ROV 01/27/10 -- COPD and restrictive lung dz (post-LLlobectomy). Last time we agreed to go back on Spiriva, he took for 3 days and stopped because it didnt help. He still feels that his exertional tolerance is limited. He tried xopenex 3 or 4 times, didn't change his breathing significantly.   ROV 04/27/10 -- COPD + restrictive lung dz post LLlobectomy (no ILD on CT scan). Didn't like Spiriva. Last time we started Symbicort two times a day - he feels better on this, but not much. Since he has been on it, has had more throat congestion and scratchiness. He does benefit from his as needed SABA. Today is desaturated on RA to 79%, recovered to 94% on 2L/min.  He has felt better since he started using O2 at night, isn't using it during the day or with exertion.   Allergies: 1)  ! Pcn   Impression & Recommendations:  Problem # 1:  COPD (ICD-496) With superimposed restrictive dz due to his lobectomy. No evidence for ILD on CT scan. Most improtant observation here is his exertional hypoxemia. He MAY have benefittd from Symbicort.  -  change O2 to 24x7 and get portable system - continue Symbicort for another 3 months; at that time will decide whether he is benefiting - rov 3 mo   Problem # 2:  BRONCHITIS, ACUTE (ICD-466.0) - completed abx course  His updated medication list for this problem includes:    Xopenex Hfa 45 Mcg/act Aero (Levalbuterol tartrate) .Marland Kitchen... 2 puffs up to four times daily as needed    Symbicort 160-4.5 Mcg/act Aero (Budesonide-formoterol fumarate) .Marland Kitchen... 2 puffs two times a day and rinse mouth well after use  Problem # 3:  ATRIAL FIBRILLATION, PAROXYSMAL (ICD-427.31) In NSR post carvioversion  His updated medication list for this problem includes:    Aspirin 81 Mg Tbec (Aspirin) .Marland Kitchen... Take one by mouth once daily    Metoprolol Tartrate 50 Mg Tabs (Metoprolol tartrate) .Marland Kitchen... Take one tablet by mouth twice a day    Amiodarone Hcl 200 Mg Tabs (Amiodarone hcl) .Marland Kitchen... 1 tab by mouth once daily    Warfarin Sodium 2.5 Mg Tabs (Warfarin sodium) ..... Use as directed by anticoagualtion clinic  Other Orders: Est. Patient Level IV (86578) DME Referral (DME)  Patient Instructions: 1)  We will arrange for oxygen at all times through [employer] 2)  Continue Symbicort two times a day  3)  Follow up with Dr Delton Coombes in 3 months or as needed    Orders Added: 1)  Est. Patient Level IV [46962]  2)  DME Referral [DME]

## 2010-05-06 NOTE — Procedures (Signed)
Summary: [facility]   Current Medications (verified): 1)  Tylenol 8 Hour 650 Mg Tbcr (Acetaminophen) .... Take 3 By Mouth Daily As Needed 2)  Prevacid 30 Mg Cpdr (Lansoprazole) .... Take 1 Tablet By Mouth Two Times A Day 3)  Aspirin 81 Mg Tbec (Aspirin) .... Take One By Mouth Once Daily 4)  Senokot 8.6 Mg Tabs (Sennosides) .... As Needed 5)  Colchicine 0.6 Mg Tabs (Colchicine) .... One Tablet By Mouth Twice Daily As Needed For Gout 6)  Furosemide 40 Mg Tabs (Furosemide) .Marland Kitchen.. 1 Tab By Mouth By Mouth Daily 7)  Metoprolol Tartrate 50 Mg Tabs (Metoprolol Tartrate) .... Take One Tablet By Mouth Twice A Day 8)  Amiodarone Hcl 200 Mg Tabs (Amiodarone Hcl) .Marland Kitchen.. 1 Tab By Mouth Once Daily 9)  Colace 100 Mg Caps (Docusate Sodium) .... As Needed 10)  Zoloft 25 Mg Tabs (Sertraline Hcl) .Marland Kitchen.. 1 By Mouth Daily 11)  Warfarin Sodium 2.5 Mg Tabs (Warfarin Sodium) .... Use As Directed By Anticoagualtion Clinic 12)  Meloxicam  (Meloxicam) .... 3 or 4 Times Weekly 13)  Xopenex Hfa 45 Mcg/act Aero (Levalbuterol Tartrate) .... 2 Puffs Up To Four Times Daily As Needed 14)  Symbicort 160-4.5 Mcg/act Aero (Budesonide-Formoterol Fumarate) .... 2 Puffs Two Times A Day and Rinse Mouth Well After Use 15)  Benadryl 25 Mg Caps (Diphenhydramine Hcl) .... Otc As Directed.  Allergies (verified): 1)  ! Pcn  PPM Specifications Following MD:  Sherryl Manges, MD     Referring MD:  [facility] Vendor:  [employer]     PPM Model Number:  [id number]     PPM Serial Number:  [id number] DOI:  05/08/2009     PPM Implanting MD:  NOT IMPLANTED HERE  Lead 1    Location: RA     DOI: 05/08/2009     Model #: [id number]     Serial #: [id number]     Status: active Lead 2    Location: RV     DOI: 05/08/2009     Model #: [id number]     Serial #: [id number]     Status: active   Indications:  AFIB/CHB   PPM Follow Up Battery Voltage:  2.82 V     Battery Est. Longevity:  6.25-8 yrs     Pacer Dependent:  No       PPM Device Measurements Atrium   Amplitude: 2.1 mV, Impedance: 329 ohms, Threshold: 0.50 V at 0.4 msec Right Ventricle  Amplitude: 5.6 mV, Impedance: 387 ohms, Threshold: 0.75 V at 0.4 msec  Episodes MS Episodes:  33     Percent Mode Switch:  <1%     Coumadin:  No Ventricular High Rate:  0     Atrial Pacing:  91%     Ventricular Pacing:  99%  Parameters Mode:  DDDR     Lower Rate Limit:  70     Upper Rate Limit:  120 Paced AV Delay:  200     Sensed AV Delay:  150 Next Cardiology Appt Due:  10/04/2010 Tech Comments:  33 AMS EPISODES---LONGEST WAS 12 SECONDS.  NORMAL DEVICE FUNCTION.  NO CHANGES MADE. ROV IN 6 MTHS W/SK. Vella Kohler  April 28, 2010 3:14 PM

## 2010-05-06 NOTE — Assessment & Plan Note (Signed)
Summary: pt had pacer put  [facility] ON 2/4/ST. JUDE/OK PER AMBER/SAF   Referring Provider:  [name] Primary Provider:  [name]  CC:  pt had pacemaker insertion at [facility] 05/08/2009 .  History of Present Illness: Mr. Adam [name] is seen at the request of Dr. Eden Emms to establish pacemaker followup.  He is status post aVR and CABG at [facility] in February 2011. The procedure was complicated by complete heart block requiring permanent pacemaker implantation. Postoperative echo yesterday  demonstrated ejection fraction of 50%. There is moderate right ventricular dilatation and diastolic dysfunction.  The patient has problems with shortness of breath. There has been a plateau in his recovery about 4 weeks ago. Chest CT or chest x-ray raised the possibility of pulmonary fibrosis evaluation which is ongoing.  He  has a history of atrial fibrillation perirocedurally for which he's been treated with amiodarone but not Coumadin.       Current Medications (verified): 1)  Tylenol 8 Hour 650 Mg Tbcr (Acetaminophen) .... Take 3 By Mouth Daily 2)  Prevacid 30 Mg Cpdr (Lansoprazole) .... Take 1 Tablet By Mouth Two Times A Day 3)  Ambien 10 Mg  Tabs (Zolpidem Tartrate) .Marland Kitchen.. 1 By Mouth At Bedtime As Needed Insomnia 4)  Aspirin 81 Mg Tbec (Aspirin) .... Take One By Mouth Once Daily 5)  Senokot 8.6 Mg Tabs (Sennosides) .... As Needed 6)  Colchicine 0.6 Mg Tabs (Colchicine) .... One Tablet By Mouth Twice Daily As Needed For Gout 7)  Furosemide 40 Mg Tabs (Furosemide) .Marland Kitchen.. 1 Tab By Mouth By Mouth Daily 8)  Metoprolol Tartrate 25 Mg Tabs (Metoprolol Tartrate) .... Take One Tablet By Mouth Twice A Day 9)  Lipitor 10 Mg Tabs (Atorvastatin Calcium) .Marland Kitchen.. 1 Tab Op Once Daily 10)  Lopid 600 Mg Tabs (Gemfibrozil) .Marland Kitchen.. 1 Tab By Mouth Once Daily 11)  Amiodarone Hcl 200 Mg Tabs (Amiodarone Hcl) .Marland Kitchen.. 1 Tab By Mouth Once Daily 12)  Colace 100 Mg Caps (Docusate Sodium) .... As Needed 13)  Zoloft 25 Mg Tabs (Sertraline Hcl)  .... 1/2  Tab By Mouth Once Daily  Allergies (verified): 1)  ! Pcn  Past History:  Past Medical History: Last updated: 12/16/2008  Lung cancer, hx of  COPD  Diverticulosis, colon  GERD/ Stricture  Esophageal dysmotility  Hyperlipidemia  Hypertension Osteoarthritis  Padget's  aortic stenosis  mild renal insufficiency  erectile dysfunction venous insufficiency Gout BPH  Past Surgical History: Last updated: 07/01/2009 Aortic valve replacement with 25 mm CE bovine pericardial valve and cornary artery bypass graft x3 with left internal mammary artery to left anterior descending, saphenous vein graft to obutse marginal 2 and saphenous vein graft to posterior desending cornary arteries indivually on 05/04/09 Status post implantation of dual chamber pacemaker, [employer] on 05/08/09 Cholecystectomy Tonsillectomy Lung-lobectomy (1991) Prostate surgery (1990) Left thumb surgery Vein stripping Basal cell surgery- nose Stress test- pos.  AS on Echo, LVH (09/2001) EGD- stricture (07/207) Colonoscopy- diverticulosis, small polyp, hemorrhoids (03/2001) ,  diverticulosis, hemorrhoids (01/2003) Cardiolite- neg., mild-mod AS (11/2003) Carotids- neg (11/2005) EGD- gastritis, stricture, dysmotility (07/2004) Rib films- right 10th rib fracture (08/2005) Echo- stable (09/2005) 3/09 2D echo- mod aortic stenosis with EF 55%  Family History: Last updated: 12/16/2008 Father with CAD @ 46 mother with pancreatic ca sister with ?abdominal ca Colon Cancer: brother age 45- died   Social History: Last updated: 11/07/2008 quit smoking 1991 Patient is a former smoker.  Alcohol Use - no Daily Caffeine Use coffee at breakfast golf for  exercise  Vital Signs:  Patient profile:   77 year old male Height:      71 inches Weight:      162 pounds BMI:     22.68 Pulse rate:   86 / minute Pulse rhythm:   irregular BP sitting:   98 / 60  (left arm) Cuff size:   regular  Vitals Entered By:  Judithe Modest CMA (July 31, 2009 9:49 AM)  Physical Exam  General:  Alert and orientedelderly Caucasian male appearing his stated age in no acute distress. HEENT  normal . Neck veins were flat; carotids brisk and full without bruits. No lymphadenopathy. Back without kyphosis. Lungs clear. Heart sounds regular without murmurs or gallops. PMI nondisplaced. Abdomen soft with active bowel sounds without midline pulsation or hepatomegaly. Femoral pulses and distal pulses intact. Extremities were without clubbing cyanosis or edemaSkin warm and dry. Neurological exam grossly normal; affect  was engaging    EKG  Procedure date:  07/30/2009  Findings:      atrial fibrillation with underlying ventricular pacing  PPM Specifications Following MD:  Sherryl Manges, MD     Referring MD:  [facility] Vendor:  [facility] Jude     PPM Model Number:  [id number]     PPM Serial Number:  [id number] DOI:  05/08/2009     PPM Implanting MD:  NOT IMPLANTED HERE  Lead 1    Location: RA     DOI: 05/08/2009     Model #: [id number]     Serial #: [id number]     Status: active Lead 2    Location: RV     DOI: 05/08/2009     Model #: [id number]     Serial #: [id number]     Status: active   Indications:  AFIB/CHB   PPM Follow Up Remote Check?  No Battery Voltage:  2.79 V     Battery Est. Longevity:  10 YEARS     Pacer Dependent:  No       PPM Device Measurements Atrium  Amplitude: 1.9 mV, Impedance: 397 ohms,  Right Ventricle  Amplitude: 6.1 mV, Impedance: 517 ohms, Threshold: 0.75 V at 0.4 msec  Episodes MS Episodes:  9     Percent Mode Switch:  79%     Coumadin:  No Ventricular High Rate:  0     Atrial Pacing:  40%     Ventricular Pacing:  91%  Parameters Mode:  DDDR     Lower Rate Limit:  70     Upper Rate Limit:  120 Paced AV Delay:  200     Sensed AV Delay:  150 Next Cardiology Appt Due:  05/05/2010 Tech Comments:  Pt seen to establish for pacer implanted at [facility] for CHB s/p CABG.  Pt in afib today, RVR during afib at times with  24% of heart rates during afib >90bpm.  Ventricular autocapture turned off today because of unstable threshold trends.  EGM storage also turned off to preserve battery longevity.  ROV 2-11 Dr Graciela Husbands. Gypsy Balsam RN BSN  July 31, 2009 10:29 AM   Impression & Recommendations:  Problem # 1:  ATRIAL FIBRILLATION, PAROXYSMAL (ICD-427.31) Patient has atrial fibrillation is persistent. He has thromboembolic risk factors noted for hypertension age.as well as vascular disease for a CHADS VASC score of 4. He needs Coumadin. We will begin at today. I reviewed this with him and his daughter including the potential risks of bleeding.  This will allow [place] to try to  reattempt cardioversion in about 3-4 weeks following region the therapeutic range. Unfortunately his atrial fibrillation is poorly rate controlled with 30% of his beats faster than 110 beats per minute. This might likely be contributing to his congestive heart failure in the context of normal systolic function.  Today and in the short term we'll double his metoprolol. We will continue him on his amiodarone (see below) and anticipate cardioversion about 4 weeks. His updated medication list for this problem includes:    Aspirin 81 Mg Tbec (Aspirin) .Marland Kitchen... Take one by mouth once daily    Metoprolol Tartrate 50 Mg Tabs (Metoprolol tartrate) .Marland Kitchen... Take one tablet by mouth twice a day    Amiodarone Hcl 200 Mg Tabs (Amiodarone hcl) .Marland Kitchen... 1 tab by mouth once daily    Warfarin Sodium 2.5 Mg Tabs (Warfarin sodium) ..... Use as directed by anticoagualtion clinic  Orders: EKG w/ Interpretation (93000)  Problem # 2:  SHORTNESS OF BREATH (ICD-786.05) shortness of breath is quite concerning. I wonder whether this represents diastolic heart failure. This could be related to his rapid atrial fibrillation. The other concern is that it may be a manifestation of amiodarone lung toxicity especially given the term "pulmonary fibrosis" that has been used. We'll need to  keep this in mind but for right now I have elected to continue him on his amiodarone with anticipation of cardioversion in about one month His updated medication list for this problem includes:    Aspirin 81 Mg Tbec (Aspirin) .Marland Kitchen... Take one by mouth once daily    Furosemide 40 Mg Tabs (Furosemide) .Marland Kitchen... 1 tab by mouth by mouth daily    Metoprolol Tartrate 50 Mg Tabs (Metoprolol tartrate) .Marland Kitchen... Take one tablet by mouth twice a day  Problem # 3:  CARDIAC PACEMAKER IN SITU (ICD-V45.01) device was interrogated. It is noted that he has been in atrial fibrillation for about 7 weeks. His rates are quite rapid as noted above.  Patient Instructions: 1)  Your physician has recommended you make the following change in your medication: Increase Metoprolol to 50mg  1 tablet twice daily.  Start Coumadin (Warfarin) 2.5mg  1 tablet daily.   2)  You have been referred to Coumadin Clinic in 5 days. 3)  Your physician wants you to follow-up in: February 2011 with Dr Graciela Husbands.  You will receive a reminder letter in the mail two months in advance. If you don't receive a letter, please call our office to schedule the follow-up appointment. Prescriptions: WARFARIN SODIUM 2.5 MG TABS (WARFARIN SODIUM) Use as directed by Anticoagualtion Clinic  #30 x 0   Entered by:   Optometrist BSN   Authorized by:   Nathen May, MD, [facility]   Signed by:   Gypsy Balsam RN BSN on 07/31/2009   Method used:   Electronically to        [facility]  [address] (retail)       [address]       [place], [place]  [address]       Ph: [phone] or [phone]       Fax: [phone]   RxID:   [id number] METOPROLOL TARTRATE 50 MG TABS (METOPROLOL TARTRATE) Take one tablet by mouth twice a day  #60 x 11   Entered by:   Optometrist BSN   Authorized by:   Nathen May, MD, [facility]   Signed by:   Gypsy Balsam RN BSN on 07/31/2009   Method used:   [facility] to  [facility]  [address]  (retail)       [address]       [place], [place]  [address]       Ph: [phone] or [phone]       Fax: [phone]   RxID:   [id number]

## 2010-05-06 NOTE — Assessment & Plan Note (Signed)
Summary: per check out/sf   Visit Type:  Follow-up Referring Provider:  [name] Primary Provider:  [name]  CC:  no complaints.  History of Present Illness: Adam [name] is seen today for F/U of his afib.  He is a complicated patient.  He was sent to [facility] 2 months ago to consider percutaneous AVR for severe AS.  He ended up with an open AVR/CABG complicated by CHB and afib.  He required a pacer.  HIs pacer indicates he has been in abib for about 7 weeks.  He has fairly profound dyspnea post op and appears to have pulmonary fibrosis complicating his COPD.  He has had PAF in the past and tolerates it poorly likely from diastolic dysfunction.  He was started on coumadin a couple of weeks ago.  We increased his beta blocker and he feels slightly better with improved rate control.  We stopped his amiodarone due to lung issues and I believe he has also had LFT abnormalities before.  We discussed [facility] once he has been therapeutic for 4 weeks and he and his daughter agree that this is indicated.  If he feels to convert we would consider Tikosyn Rx.  He denies SSCP, palpitations, edema or syncope.  He has not had a cough, sputum or hemoptysis.  His INR has been Rx the last 2 weeks.  I also discussed with him if his INR were difficult to keep Rx or he drops low I would like to admit him for TEE/DCC to expedite matters.  He has been going to rehab and his BP is borderline.  We will cut back his Lasix if his BNP is low or post [facility] if av synchrony is restored.    Campbell Riches  [phone]  Current Problems (verified): 1)  Coumadin Therapy  (ICD-V58.61) 2)  Atrial Fibrillation, Paroxysmal  (ICD-427.31) 3)  Cardiac Pacemaker in Situ  (ICD-V45.01) 4)  Shortness of Breath  (ICD-786.05) 5)  Constipation  (ICD-564.00) 6)  Aortic Stenosis  (ICD-424.1) 7)  Premature Ventricular Contractions  (ICD-427.69) 8)  Syncope  (ICD-780.2) 9)  Hypercholesterolemia  (ICD-272.0) 10)  Arthritis  (ICD-716.90) 11)   Hx of Cad  (ICD-414.00) 12)  Hypertension  (ICD-401.9) 13)  Hyperlipidemia  (ICD-272.4) 14)  COPD  (ICD-496) 15)  Renal Insufficiency  (ICD-588.9) 16)  Carcinoma, Colon, Family Hx  (ICD-V16.0) 17)  Rectal Bleeding  (ICD-569.3) 18)  Change in Bowels  (ICD-787.99) 19)  Gout, Unspecified  (ICD-274.9) 20)  Hiatal Hernia With Reflux  (ICD-553.3) 21)  Gastritis  (ICD-535.50) 22)  Esophageal Stricture  (ICD-530.3) 23)  Hemorrhoids, External  (ICD-455.3) 24)  Gouty Arthropathy  (ICD-274.0) 25)  Hx of Benign Prostatic Hypertrophy, Hx of  (ICD-V13.8) 26)  Hx of Allergy  (ICD-995.3) 27)  Hx of Venous Insufficiency  (ICD-459.81) 28)  Erectile Dysfunction  (ICD-302.72) 29)  Osteoarthritis  (ICD-715.90) 30)  Gerd  (ICD-530.81) 31)  Diverticulosis, Colon  (ICD-562.10) 32)  Lung Cancer, Hx of  (ICD-V10.11)  Current Medications (verified): 1)  Tylenol 8 Hour 650 Mg Tbcr (Acetaminophen) .... Take 3 By Mouth Daily 2)  Prevacid 30 Mg Cpdr (Lansoprazole) .... Take 1 Tablet By Mouth Two Times A Day 3)  Aspirin 81 Mg Tbec (Aspirin) .... Take One By Mouth Once Daily 4)  Senokot 8.6 Mg Tabs (Sennosides) .... As Needed 5)  Colchicine 0.6 Mg Tabs (Colchicine) .... One Tablet By Mouth Twice Daily As Needed For Gout 6)  Furosemide 40 Mg Tabs (Furosemide) .Marland Kitchen.. 1 Tab By Mouth  By Mouth Daily 7)  Metoprolol Tartrate 50 Mg Tabs (Metoprolol Tartrate) .... Take One Tablet By Mouth Twice A Day 8)  Lipitor 10 Mg Tabs (Atorvastatin Calcium) .Marland Kitchen.. 1 Tab Op Once Daily 9)  Lopid 600 Mg Tabs (Gemfibrozil) .Marland Kitchen.. 1 Tab By Mouth Once Daily 10)  Amiodarone Hcl 200 Mg Tabs (Amiodarone Hcl) .Marland Kitchen.. 1 Tab By Mouth Once Daily 11)  Colace 100 Mg Caps (Docusate Sodium) .... As Needed 12)  Zoloft 25 Mg Tabs (Sertraline Hcl) .... 1/2  Tab By Mouth Once Daily 13)  Warfarin Sodium 2.5 Mg Tabs (Warfarin Sodium) .... Use As Directed By Anticoagualtion Clinic  Allergies (verified): 1)  ! Pcn  Past History:  Past Medical History: Last  updated: 12/16/2008  Lung cancer, hx of  COPD  Diverticulosis, colon  GERD/ Stricture  Esophageal dysmotility  Hyperlipidemia  Hypertension Osteoarthritis  Padget's  aortic stenosis  mild renal insufficiency  erectile dysfunction venous insufficiency Gout BPH  Past Surgical History: Last updated: 07/01/2009 Aortic valve replacement with 25 mm CE bovine pericardial valve and cornary artery bypass graft x3 with left internal mammary artery to left anterior descending, saphenous vein graft to obutse marginal 2 and saphenous vein graft to posterior desending cornary arteries indivually on 05/04/09 Status post implantation of dual chamber pacemaker, [employer] on 05/08/09 Cholecystectomy Tonsillectomy Lung-lobectomy (1991) Prostate surgery (1990) Left thumb surgery Vein stripping Basal cell surgery- nose Stress test- pos.  AS on Echo, LVH (09/2001) EGD- stricture (07/207) Colonoscopy- diverticulosis, small polyp, hemorrhoids (03/2001) ,  diverticulosis, hemorrhoids (01/2003) Cardiolite- neg., mild-mod AS (11/2003) Carotids- neg (11/2005) EGD- gastritis, stricture, dysmotility (07/2004) Rib films- right 10th rib fracture (08/2005) Echo- stable (09/2005) 3/09 2D echo- mod aortic stenosis with EF 55%  Family History: Last updated: 12/16/2008 Father with CAD @ 75 mother with pancreatic ca sister with ?abdominal ca Colon Cancer: brother age 36- died   Social History: Last updated: 11/07/2008 quit smoking 1991 Patient is a former smoker.  Alcohol Use - no Daily Caffeine Use coffee at breakfast golf for exercise  Review of Systems       Denies fever, malais, weight loss, blurry vision, decreased visual acuity, cough, sputum,  hemoptysis, pleuritic pain, palpitaitons, heartburn, abdominal pain, melena, lower extremity edema, claudication, or rash.   Vital Signs:  Patient profile:   77 year old male Height:      71 inches Weight:      166 pounds BMI:      23.24 Pulse rate:   73 / minute Resp:     16 per minute BP sitting:   93 / 61  (right arm)  Vitals Entered By: Marrion Coy, CNA (Aug 28, 2009 8:28 AM)  Physical Exam  General:  Affect appropriate Healthy:  appears stated age HEENT: normal Neck supple with no adenopathy JVP normal no bruits no thyromegaly Lungs clear with no wheezing and good diaphragmatic motion Heart:  S1/S2 systolic murmur no rub, gallop or click PMI normal Abdomen: benighn, BS positve, no tenderness, no AAA no bruit.  No HSM or HJR Distal pulses intact with no bruits No edema Neuro non-focal Skin warm and dry L thoracotomy scar   PPM Specifications Following MD:  Sherryl Manges, MD     Referring MD:  [facility] Vendor:  [employer]     PPM Model Number:  [id number]     PPM Serial Number:  [id number] DOI:  05/08/2009     PPM Implanting MD:  NOT IMPLANTED HERE  Lead 1  Location: [facility]     DOI: 05/08/2009     Model #: [id number]     Serial #: [id number]     Status: active Lead 2    Location: RV     DOI: 05/08/2009     Model #: [id number]     Serial #: [id number]     Status: active   Indications:  AFIB/CHB   PPM Follow Up Pacer Dependent:  No      Episodes Coumadin:  No  Parameters Mode:  DDDR     Lower Rate Limit:  70     Upper Rate Limit:  120 Paced AV Delay:  200     Sensed AV Delay:  150  Impression & Recommendations:  Problem # 1:  COUMADIN THERAPY (ICD-V58.61) INR 3.0 last week.  Will check today  Problem # 2:  ATRIAL FIBRILLATION, PAROXYSMAL (ICD-427.31) Better rate control.  DCC next Friday. [employer] to be there to program pacer His updated medication list for this problem includes:    Aspirin 81 Mg Tbec (Aspirin) .Marland Kitchen... Take one by mouth once daily    Metoprolol Tartrate 50 Mg Tabs (Metoprolol tartrate) .Marland Kitchen... Take one tablet by mouth twice a day    Amiodarone Hcl 200 Mg Tabs (Amiodarone hcl) .Marland Kitchen... 1 tab by mouth once daily    Warfarin Sodium 2.5 Mg Tabs (Warfarin sodium) ..... Use as directed by anticoagualtion  clinic  Problem # 3:  SHORTNESS OF BREATH (ICD-786.05) Multifactorial.  COPD fibrosis history of lung CA with Left thoracotomy.  Componant of diastolic dysfunction  Check BNP today His updated medication list for this problem includes:    Aspirin 81 Mg Tbec (Aspirin) .Marland Kitchen... Take one by mouth once daily    Furosemide 40 Mg Tabs (Furosemide) .Marland Kitchen... 1 tab by mouth by mouth daily    Metoprolol Tartrate 50 Mg Tabs (Metoprolol tartrate) .Marland Kitchen... Take one tablet by mouth twice a day  Orders: TLB-BNP (B-Natriuretic Peptide) (83880-BNPR)  Problem # 4:  AORTIC STENOSIS (ICD-424.1) S/P tissue AVR 25mm normal by echo His updated medication list for this problem includes:    Furosemide 40 Mg Tabs (Furosemide) .Marland Kitchen... 1 tab by mouth by mouth daily    Metoprolol Tartrate 50 Mg Tabs (Metoprolol tartrate) .Marland Kitchen... Take one tablet by mouth twice a day  Patient Instructions: 1)  Your physician recommends that you schedule a follow-up appointment in:

## 2010-05-06 NOTE — Assessment & Plan Note (Signed)
Summary: COPD, ILD   Visit Type:  Follow-up Copy to:  Dr. Eden Emms Primary Provider/Referring Provider:  [name]  CC:  COPD...the patient c/o SOB with exertion and has not been using his inhalers...he said they did not help his breathing.  History of Present Illness: 77 yo man, former smoker and hx of asbestos exposure, Hx of lung CA s/p LLL lobectomy in 1991, CAD and AV disease s/p CABG/AVR in 04/2009. He also has Afib s/p pacer and successful cardioversion on June 3. He has been on home O2 since his SGY, imaging has shown ? ILD. He also had COPD documented prior to SGY. New PFT's were done today as below. Not currently on BD's.   ROS: he has exertional SOB when he walks through the house. He has some UA congestion and needs to throat clear. Constant clear nasal drainage.   ROV 11/26/09 -- returns for f/u of multifactorial SOB, with COPD and restriction post lobectomy as contributors. Since last visit has had CT scan of the chest - no ILD, some pleural thickening, some emphysematous changes. We did trial of Spiriva, only took it for 3 days due to dizziness. He thinks his breathing did benefit some while he was on the med. Has been doing IS more frequently.   ROV 01/27/10 -- COPD and restrictive lung dz (post-LLlobectomy). Last time we agreed to go back on Spiriva, he took for 3 days and stopped because it didnt help. He still feels that his exertional tolerance is limited. He tried xopenex 3 or 4 times, didn't change his breathing significantly.   Current Medications (verified): 1)  Tylenol 8 Hour 650 Mg Tbcr (Acetaminophen) .... Take 3 By Mouth Daily As Needed 2)  Prevacid 30 Mg Cpdr (Lansoprazole) .... Take 1 Tablet By Mouth Two Times A Day 3)  Aspirin 81 Mg Tbec (Aspirin) .... Take One By Mouth Once Daily 4)  Senokot 8.6 Mg Tabs (Sennosides) .... As Needed 5)  Colchicine 0.6 Mg Tabs (Colchicine) .... One Tablet By Mouth Twice Daily As Needed For Gout 6)  Furosemide 40 Mg Tabs  (Furosemide) .Marland Kitchen.. 1 Tab By Mouth By Mouth Daily 7)  Metoprolol Tartrate 50 Mg Tabs (Metoprolol Tartrate) .... Take One Tablet By Mouth Twice A Day 8)  Lipitor 10 Mg Tabs (Atorvastatin Calcium) .Marland Kitchen.. 1 Tab Op Once Daily 9)  Lopid 600 Mg Tabs (Gemfibrozil) .Marland Kitchen.. 1 Tab By Mouth Once Daily 10)  Amiodarone Hcl 200 Mg Tabs (Amiodarone Hcl) .Marland Kitchen.. 1 Tab By Mouth Once Daily 11)  Colace 100 Mg Caps (Docusate Sodium) .... As Needed 12)  Zoloft 25 Mg Tabs (Sertraline Hcl) .Marland Kitchen.. 1 By Mouth Daily 13)  Warfarin Sodium 2.5 Mg Tabs (Warfarin Sodium) .... Use As Directed By Anticoagualtion Clinic 14)  Meloxicam  (Meloxicam) .... 3 or 4 Times Weekly 15)  Benadryl 25 Mg Tabs (Diphenhydramine Hcl) .Marland Kitchen.. 1 Tab By Mouth Once Daily As Needed 16)  Xopenex Hfa 45 Mcg/act Aero (Levalbuterol Tartrate) .... 2 Puffs Up To Four Times Daily As Needed  Allergies (verified): 1)  ! Pcn  Vital Signs:  Patient profile:   77 year old male Height:      71 inches (180.34 cm) Weight:      171 pounds (77.73 kg) BMI:     23.94 O2 Sat:      89 % on Room air Temp:     97.8 degrees F (36.56 degrees C) oral Pulse rate:   77 / minute BP sitting:   116 / 78  (  left arm) Cuff size:   regular  Vitals Entered By: Michel Bickers CMA (January 27, 2010 4:41 PM)  O2 Sat at Rest %:  89 O2 Flow:  Room air CC: COPD...the patient c/o SOB with exertion and has not been using his inhalers...he said they did not help his breathing Comments Medications reviewed with patient Michel Bickers [facility]  January 27, 2010 4:42 PM   Physical Exam  General:  normal appearance and healthy appearing.   Head:  normocephalic and atraumatic Eyes:  conjunctiva and sclera clear Nose:  no deformity, discharge, inflammation, or lesions Mouth:  no deformity or lesions Neck:  no masses, thyromegaly, or abnormal cervical nodes Chest Wall:  L superclav pacer Lungs:  good air movement, few scattered insp crackles Heart:  regular, 3/6 M Abdomen:  not examined Msk:  no  deformity or scoliosis noted with normal posture Extremities:  no edema Neurologic:  non-focal Skin:  intact without lesions or rashes Psych:  alert and cooperative; normal mood and affect; normal attention span and concentration   Impression & Recommendations:  Problem # 1:  INTERSTITIAL LUNG DISEASE (ICD-515)  Problem # 2:  COPD (ICD-496) He is willing to try Symbicort 160, 2 puffs two times a day. Encouraged him to take reliably x 2 weeks, then call to update [place] on his progress. if he benefits then will call to his pharmacy  Medications Added to Medication List This Visit: 1)  Tylenol 8 Hour 650 Mg Tbcr (Acetaminophen) .... Take 3 by mouth daily as needed 2)  Zoloft 25 Mg Tabs (Sertraline hcl) .Marland Kitchen.. 1 by mouth daily 3)  Benadryl 25 Mg Tabs (Diphenhydramine hcl) .Marland Kitchen.. 1 tab by mouth once daily as needed  Other Orders: Est. Patient Level IV (64403)  Patient Instructions: 1)  Start Symbcort 160/4.43mcg, 2 puffs two times a day  2)  Take for two weeks thenh call our office to report on your status. If you benefit then we will call it in to your pharmacy.  3)  Follow with Dr Delton Coombes in 3 months or as needed    Immunization History:  Influenza Immunization History:    Influenza:  historical (12/03/2009)

## 2010-05-06 NOTE — Progress Notes (Signed)
Summary: needs order for physical therapy  Phone Note From Other Clinic   Caller: Costella Hatcher, physical therapist  [phone] Summary of Call: Therapist is requesting outpatient physical therapy for conditioning and strenghtening after heart surgery, to be done at [facility].Marland Kitchen Please send order. Initial call taken by: Lowella Petties CMA,  June 17, 2009 12:39 PM  Follow-up for Phone Call        will do order and route to [facility]   Follow-up by: Judith Part MD,  June 17, 2009 1:13 PM  Additional Follow-up for Phone Call Additional follow up Details #1::        Order faxed to [facility] for them to call me back from therapy dept. Additional Follow-up by: Carlton Adam,  June 17, 2009 3:50 PM

## 2010-05-12 NOTE — Miscellaneous (Signed)
Summary: Order for Overnight Oximetry/[facility]  Order for Overnight Oximetry/[facility]   Imported By: Sherian Rein 05/04/2010 14:34:04  _____________________________________________________________________  External Attachment:    Type:   Image     Comment:   External Document

## 2010-05-12 NOTE — Cardiovascular Report (Signed)
Summary: Office Visit   Office Visit   Imported By: Roderic Ovens 05/06/2010 10:29:32  _____________________________________________________________________  External Attachment:    Type:   Image     Comment:   External Document

## 2010-05-14 ENCOUNTER — Telehealth (INDEPENDENT_AMBULATORY_CARE_PROVIDER_SITE_OTHER): Payer: Self-pay | Admitting: *Deleted

## 2010-05-18 DIAGNOSIS — Z7901 Long term (current) use of anticoagulants: Secondary | ICD-10-CM

## 2010-05-18 DIAGNOSIS — I4891 Unspecified atrial fibrillation: Secondary | ICD-10-CM

## 2010-05-19 ENCOUNTER — Encounter: Payer: Self-pay | Admitting: Emergency Medicine

## 2010-05-21 ENCOUNTER — Encounter: Payer: Self-pay | Admitting: Cardiology

## 2010-05-21 ENCOUNTER — Encounter (INDEPENDENT_AMBULATORY_CARE_PROVIDER_SITE_OTHER): Payer: Medicare HMO

## 2010-05-21 DIAGNOSIS — I4891 Unspecified atrial fibrillation: Secondary | ICD-10-CM

## 2010-05-21 DIAGNOSIS — Z7901 Long term (current) use of anticoagulants: Secondary | ICD-10-CM

## 2010-05-21 LAB — CONVERTED CEMR LAB: POC INR: 1.6

## 2010-05-26 NOTE — Miscellaneous (Signed)
Summary: OCD Titration/[facility]  OCD Titration/[facility]   Imported By: Sherian Rein 05/17/2010 11:29:43  _____________________________________________________________________  External Attachment:    Type:   Image     Comment:   External Document

## 2010-05-26 NOTE — Progress Notes (Signed)
Summary: [facility] calling  Phone Note From Other Clinic   Caller: ella w/ [facility] Call For: alida Summary of Call: requests to speak to alida re: pt. [phone] Initial call taken by: Tivis Ringer, CNA,  May 14, 2010 11:59 AM  Follow-up for Phone Call        spoke with Samson Frederic on fri 05/14/10 .Kandice Hams [facility]  May 17, 2010 9:31 AM  Follow-up by: Kandice Hams CMA,  May 17, 2010 9:31 AM

## 2010-05-26 NOTE — Medication Information (Signed)
Summary: ccr/tmju  Anticoagulant Therapy  Managed by: Weston Brass, PharmD PCP: [name] Supervising MD: Daleen Squibb MD, Maisie Fus Indication 1: Atrial Fibrillation Lab Used: [facility] [place] Site: [address] INR POC 1.6 INR RANGE 2-3  Dietary changes: no    Health status changes: no    Bleeding/hemorrhagic complications: no    Recent/future hospitalizations: no    Any changes in medication regimen? yes       Details: Lipitor and Gemfibrozil stopped approximately 1 month ago on the same day that the patient was seen last in the Coumadin Clinic  Recent/future dental: no  Any missed doses?: no       Is patient compliant with meds? yes       Allergies: 1)  ! Pcn  Anticoagulation Management History:      The patient is taking warfarin and comes in today for a routine follow up visit.  Positive risk factors for bleeding include an age of 75 years or older and presence of serious comorbidities.  The bleeding index is 'intermediate risk'.  Positive CHADS2 values include History of HTN and Age > 37 years old.  His last INR was 2.5.  Anticoagulation responsible provider: Daleen Squibb MD, Maisie Fus.  INR POC: 1.6.  Cuvette Lot#: [id number].  Exp: 04/2011.    Anticoagulation Management Assessment/Plan:      The patient's current anticoagulation dose is Warfarin sodium 2.5 mg tabs: Use as directed by Anticoagualtion Clinic.  The target INR is 2.0-3.0.  The next INR is due 06/04/2010.  Anticoagulation instructions were given to patient/spouse.  Results were reviewed/authorized by Weston Brass, PharmD.  He was notified by Margot Chimes PharmD Candidate.         Prior Anticoagulation Instructions: INR 2.1 Continue 1 pill everyday except 1.5 pills on Wednesdays. Recheck in 4  weeks.   Current Anticoagulation Instructions: INR 1.6   Take an extra 1/2 tablet today and tomorrow.  Now you will take 1 tablet everyday except on Wednesdays and Saturdays when you take 1 and 1/2 tablets.   Recheck INR in 2 weeks.

## 2010-05-27 ENCOUNTER — Ambulatory Visit (INDEPENDENT_AMBULATORY_CARE_PROVIDER_SITE_OTHER): Payer: Medicare HMO | Admitting: Family Medicine

## 2010-05-27 ENCOUNTER — Encounter: Payer: Self-pay | Admitting: Family Medicine

## 2010-05-27 DIAGNOSIS — I872 Venous insufficiency (chronic) (peripheral): Secondary | ICD-10-CM

## 2010-05-28 ENCOUNTER — Encounter: Payer: Self-pay | Admitting: Physician Assistant

## 2010-05-28 ENCOUNTER — Ambulatory Visit (INDEPENDENT_AMBULATORY_CARE_PROVIDER_SITE_OTHER): Payer: Medicare HMO | Admitting: Physician Assistant

## 2010-05-28 ENCOUNTER — Encounter (INDEPENDENT_AMBULATORY_CARE_PROVIDER_SITE_OTHER): Payer: Medicare HMO

## 2010-05-28 ENCOUNTER — Encounter: Payer: Self-pay | Admitting: Internal Medicine

## 2010-05-28 DIAGNOSIS — I872 Venous insufficiency (chronic) (peripheral): Secondary | ICD-10-CM

## 2010-05-28 DIAGNOSIS — Z7901 Long term (current) use of anticoagulants: Secondary | ICD-10-CM

## 2010-05-28 DIAGNOSIS — I4891 Unspecified atrial fibrillation: Secondary | ICD-10-CM

## 2010-06-01 NOTE — Assessment & Plan Note (Signed)
Summary: per dr nishan/cold right leg/dm   Visit Type:  follow-up per Eden Emms Referring Provider:  Dr. Eden Emms Primary Provider:  Idamae Schuller Tower,MD  CC:  cold right  leg at times.sob. chest discomfort and pt has a different feeling in his chest at times..  History of Present Illness: Primary Cardiologist:  Dr. Charlton Haws  Norris Brumbach is an 77 yo male with afib, AVR, CAD.  He was cardioverted him in June and he is maintaining NSR.  He has a history of diastolic dysfunction and intolerance to afib.  He has good LV function and a normal functioning AVR.  He has been on an adequate dose of Lasix for mildly elevated BNP with higher doses causing prerenal azotemia.   His CXR shows diffuse interstitial lung disease and he had pre-existing COPD before his CABG/AVR at [facility] in March.  He finished cardiac rehab at [facility] and he may be a candidate for pulmonary rehab.  He called in recently with a cold right leg a few nights ago.  No pain.  No ulcers.  He denies claudication.  He does have severe varicosities, esp. in his right leg.  He used to wear compression hose.  Current Medications (verified): 1)  Tylenol 8 Hour 650 Mg Tbcr (Acetaminophen) .... Take 3 By Mouth Daily As Needed 2)  Prevacid 30 Mg Cpdr (Lansoprazole) .... Take 1 Tablet By Mouth Two Times A Day 3)  Aspirin 81 Mg Tbec (Aspirin) .... Take One By Mouth Once Daily 4)  Senokot 8.6 Mg Tabs (Sennosides) .... As Needed 5)  Colchicine 0.6 Mg Tabs (Colchicine) .... One Tablet By Mouth Twice Daily As Needed For Gout 6)  Furosemide 40 Mg Tabs (Furosemide) .Marland Kitchen.. 1 Tab By Mouth By Mouth Daily 7)  Metoprolol Tartrate 50 Mg Tabs (Metoprolol Tartrate) .... Take One Tablet By Mouth Twice A Day 8)  Amiodarone Hcl 200 Mg Tabs (Amiodarone Hcl) .Marland Kitchen.. 1 Tab By Mouth Once Daily 9)  Colace 100 Mg Caps (Docusate Sodium) .... As Needed 10)  Zoloft 25 Mg Tabs (Sertraline Hcl) .Marland Kitchen.. 1 By Mouth Daily 11)  Warfarin Sodium 2.5 Mg Tabs (Warfarin Sodium) .... Use As  Directed By Anticoagualtion Clinic 12)  Meloxicam  (Meloxicam) .... 3 or 4 Times Weekly 13)  Xopenex Hfa 45 Mcg/act Aero (Levalbuterol Tartrate) .... 2 Puffs Up To Four Times Daily As Needed 14)  Symbicort 160-4.5 Mcg/act Aero (Budesonide-Formoterol Fumarate) .... 2 Puffs Two Times A Day and Rinse Mouth Well After Use 15)  Benadryl 25 Mg Caps (Diphenhydramine Hcl) .... Otc As Directed.  Allergies (verified): 1)  ! Pcn  Past History:  Past Medical History: Last updated: 12/16/2008  Lung cancer, hx of  COPD  Diverticulosis, colon  GERD/ Stricture  Esophageal dysmotility  Hyperlipidemia  Hypertension Osteoarthritis  Padget's  aortic stenosis  mild renal insufficiency  erectile dysfunction venous insufficiency Gout BPH  Vital Signs:  Patient profile:   77 year old male Height:      71 inches Weight:      173.50 pounds Resp:     20 per minute BP sitting:   106 / 70  (left arm) Cuff size:   regular  Vitals Entered By: Celestia Khat, CMA (May 28, 2010 10:22 AM)  Physical Exam  General:  Well nourished, well developed, in no acute distress HEENT: normal Cardiac:  RRR Lungs:  decreased breath sounds Abd: soft Ext: severe varicosities BLE, R>>L, mild ankle edema bilat.; calves soft and nontender Skin: warm and dry; no ulcers noted BLE  Neuro:  CNs 2-12 intact, no focal abnormalities noted Vasc: DP and PT dopplerable bilat.; DP 2+ bilat, PT 1+ bilat.   PPM Specifications Following MD:  Sherryl Manges, MD     Referring MD:  [facility] Vendor:  [employer]     PPM Model Number:  [id number]     PPM Serial Number:  [id number] DOI:  05/08/2009     PPM Implanting MD:  NOT IMPLANTED HERE  Lead 1    Location: RA     DOI: 05/08/2009     Model #: [id number]     Serial #: [id number]     Status: active Lead 2    Location: RV     DOI: 05/08/2009     Model #: [id number]     Serial #: [id number]     Status: active   Indications:  AFIB/CHB   PPM Follow Up Pacer Dependent:  No       Episodes Coumadin:  No  Parameters Mode:  DDDR     Lower Rate Limit:  70     Upper Rate Limit:  120 Paced AV Delay:  200     Sensed AV Delay:  150  Impression & Recommendations:  Problem # 1:  Hx of VENOUS INSUFFICIENCY (ICD-459.81) I think his symptoms are all related to this.  His pulses are fine and noted by doppler.  He has no claudicaition and no evidence of ulcers.  He will have his INR checked today to make sure he is therapeutic.  He has been advised to wear compression hose.  We discussed whether or not to get ABIs.  After further discussion with the patient and his wife, we decided this was not necessary, and I agree.  Dr. Eden Emms also saw the patient.  Follow up as planned.  Patient Instructions: 1)  Your physician wants you to follow-up in: A REMINDER HAS BEEN PUT IN THE COMPUTER FOR YOU TO SEE DR. NISHAN IN 10/2010. You will receive a reminder letter in the mail two months in advance. If you don't receive a letter, please call our office to schedule the follow-up appointment.  2)  Your physician recommends that you continue on your current medications as directed. Please refer to the Current Medication list given to you today. 3)  You will have your INR rechecked while you are here today as per Tereso Newcomer, PA-C and Dr. Eden Emms....Marland KitchenMarland Kitchen

## 2010-06-01 NOTE — Letter (Signed)
Summary: CMN for Oxygen/[facility]  CMN for Oxygen/[facility]   Imported By: Sherian Rein 05/26/2010 12:05:22  _____________________________________________________________________  External Attachment:    Type:   Image     Comment:   External Document

## 2010-06-01 NOTE — Medication Information (Signed)
Summary: per Lorin Picket Weaver/ewj  Anticoagulant Therapy  Managed by: Bethena Midget, RN, BSN PCP: [name] Supervising MD: Tenny Craw MD, Gunnar Fusi Indication 1: Atrial Fibrillation Lab Used: [facility] [place] Site: [address] INR POC 1.7 INR RANGE 2-3  Dietary changes: no    Health status changes: no    Bleeding/hemorrhagic complications: no    Recent/future hospitalizations: no    Any changes in medication regimen? no    Recent/future dental: no  Any missed doses?: no       Is patient compliant with meds? yes      Comments: Saw PA today  Allergies: 1)  ! Pcn  Anticoagulation Management History:      The patient is taking warfarin and comes in today for a routine follow up visit.  Positive risk factors for bleeding include an age of 18 years or older and presence of serious comorbidities.  The bleeding index is 'intermediate risk'.  Positive CHADS2 values include History of HTN and Age > 63 years old.  His last INR was 2.5.  Anticoagulation responsible provider: Tenny Craw MD, Gunnar Fusi.  INR POC: 1.7.  Cuvette Lot#: [id number].  Exp: 04/2011.    Anticoagulation Management Assessment/Plan:      The patient's current anticoagulation dose is Warfarin sodium 2.5 mg tabs: Use as directed by Anticoagualtion Clinic.  The target INR is 2.0-3.0.  The next INR is due 06/11/2010.  Anticoagulation instructions were given to patient/spouse.  Results were reviewed/authorized by Bethena Midget, RN, BSN.  He was notified by Bethena Midget, RN, BSN.         Prior Anticoagulation Instructions: INR 1.6   Take an extra 1/2 tablet today and tomorrow.  Now you will take 1 tablet everyday except on Wednesdays and Saturdays when you take 1 and 1/2 tablets.  Recheck INR in 2 weeks.    Current Anticoagulation Instructions: INR 1.7 Today take extra 1/2 pill then change dose to 1 pill everyday except 1.5 pills on Mondays, Wednesdays and Fridays. Recheck in 2 weeks.  Prescriptions: WARFARIN SODIUM  2.5 MG TABS (WARFARIN SODIUM) Use as directed by Anticoagualtion Clinic  #40 x 3   Entered by:   Bethena Midget, RN, BSN   Authorized by:   Colon Branch, MD, [facility]   Signed by:   Bethena Midget, RN, BSN on 05/28/2010   Method used:   [facility]  [place] [facility]       [address]       [place], [place]  [address]       Ph: [phone] or [phone]       Fax: [phone]   RxID:   [id number]

## 2010-06-01 NOTE — Assessment & Plan Note (Signed)
Summary: RIGHT LEG FEELS COLD, LOOKS BAD/ lb   Vital Signs:  Patient profile:   77 year old male Height:      71 inches Weight:      174 pounds BMI:     24.36 Temp:     97.6 degrees F oral Pulse rate:   88 / minute Pulse rhythm:   regular BP sitting:   110 / 56  (left arm) Cuff size:   regular  Vitals Entered By: Delilah Shan CMA Aljean Horiuchi Dull) (May 27, 2010 12:11 PM) CC: Right leg feels cold   History of Present Illness: Last night patient had transient circumferential cold sensation on the R leg from knee distally.  It self resolved.  No CP.  No change in baseline dyspnea.  No fevers.  No new discoloration to the leg.  He doesn't report sig pain last night.  Already on coumadin.  No trauma.  The leg warmed up after he got in the bed and covered up.  No other similar symptoms since or in other limbs.   Allergies: 1)  ! Pcn  Past History:  Past Medical History: Last updated: 12/16/2008  Lung cancer, hx of  COPD  Diverticulosis, colon  GERD/ Stricture  Esophageal dysmotility  Hyperlipidemia  Hypertension Osteoarthritis  Padget's  aortic stenosis  mild renal insufficiency  erectile dysfunction venous insufficiency Gout BPH  Review of Systems       See HPI.  Otherwise negative.    Physical Exam  General:  no apparent distress normocephalic atraumatic not on O2 today in the OV mucous membranes moist neck supple regular rate and rhythm lungs clear to auscultation bilaterally w/o wheeze noted ext w/o edema R>L varicose veins but these aren't tender to palpation.   1+ DP pulses.   Equal skin temp on the legs.     Impression & Recommendations:  Problem # 1:  Hx of VENOUS INSUFFICIENCY (ICD-459.81) His leg appears to be at baseline today.  I talked with Dr. Eden Emms and he'll get cards staff to put the patient on his schedule.  I called the patient's home- his wife answered.  I told her that she should call cards for the appointment if she didn't hear anything  soon.  She agreed and said she would handle it.  See instructions.  No other changes now. I don't know the cause of his symptoms but I would not suspect any ischemia currently given his exam.  Okay for outpatient follow up.    Complete Medication List: 1)  Tylenol 8 Hour 650 Mg Tbcr (Acetaminophen) .... Take 3 by mouth daily as needed 2)  Prevacid 30 Mg Cpdr (Lansoprazole) .... Take 1 tablet by mouth two times a day 3)  Aspirin 81 Mg Tbec (Aspirin) .... Take one by mouth once daily 4)  Senokot 8.6 Mg Tabs (Sennosides) .... As needed 5)  Colchicine 0.6 Mg Tabs (Colchicine) .... One tablet by mouth twice daily as needed for gout 6)  Furosemide 40 Mg Tabs (Furosemide) .Marland Kitchen.. 1 tab by mouth by mouth daily 7)  Metoprolol Tartrate 50 Mg Tabs (Metoprolol tartrate) .... Take one tablet by mouth twice a day 8)  Amiodarone Hcl 200 Mg Tabs (Amiodarone hcl) .Marland Kitchen.. 1 tab by mouth once daily 9)  Colace 100 Mg Caps (Docusate sodium) .... As needed 10)  Zoloft 25 Mg Tabs (Sertraline hcl) .Marland Kitchen.. 1 by mouth daily 11)  Warfarin Sodium 2.5 Mg Tabs (Warfarin sodium) .... Use as directed by anticoagualtion clinic 12)  Meloxicam (meloxicam)  .Marland KitchenMarland KitchenMarland Kitchen  3 or 4 times weekly 13)  Xopenex Hfa 45 Mcg/act Aero (Levalbuterol tartrate) .... 2 puffs up to four times daily as needed 14)  Symbicort 160-4.5 Mcg/act Aero (Budesonide-formoterol fumarate) .... 2 puffs two times a day and rinse mouth well after use 15)  Benadryl 25 Mg Caps (Diphenhydramine hcl) .... Otc as directed.  Patient Instructions: 1)  I'll talk to Dr. Eden Emms and then notify you.  If your leg gets cold again, then go to the ER.     Orders Added: 1)  Est. Patient Level III [53664] 2)  Est. Patient Level III [40347]    Current Allergies (reviewed today): ! PCN

## 2010-06-02 ENCOUNTER — Telehealth (INDEPENDENT_AMBULATORY_CARE_PROVIDER_SITE_OTHER): Payer: Self-pay | Admitting: *Deleted

## 2010-06-07 ENCOUNTER — Encounter: Payer: Self-pay | Admitting: Emergency Medicine

## 2010-06-10 NOTE — Progress Notes (Signed)
Summary: question re appt  Phone Note Call from Patient Call back at Home Phone [phone]   Reason for Call: Talk to Nurse Summary of Call: pt wife wants to know does her husband need to keep both appt for coumadin. pt states she only knew about 06-11-10 appt Initial call taken by: Roe Coombs,  June 02, 2010 1:13 PM  Follow-up for Phone Call        Cx appt on 3/2 and [facility] for pt to only come in on 3/9. Follow-up by: Bethena Midget, RN, BSN,  June 02, 2010 4:22 PM

## 2010-06-11 ENCOUNTER — Encounter (INDEPENDENT_AMBULATORY_CARE_PROVIDER_SITE_OTHER): Payer: Medicare HMO

## 2010-06-11 ENCOUNTER — Encounter: Payer: Self-pay | Admitting: Cardiovascular Disease

## 2010-06-11 DIAGNOSIS — I4891 Unspecified atrial fibrillation: Secondary | ICD-10-CM

## 2010-06-11 DIAGNOSIS — Z7901 Long term (current) use of anticoagulants: Secondary | ICD-10-CM

## 2010-06-15 NOTE — Medication Information (Signed)
Summary: rov/tm  Anticoagulant Therapy  Managed by: Windell Hummingbird, RN PCP: [name] Supervising MD: Eden Emms MD, Theron Arista Indication 1: Atrial Fibrillation Lab Used: [facility] [place] Site: [address] INR POC 1.7 INR RANGE 2-3  Dietary changes: no    Health status changes: no    Bleeding/hemorrhagic complications: no    Recent/future hospitalizations: no    Any changes in medication regimen? no    Recent/future dental: no  Any missed doses?: no       Is patient compliant with meds? yes       Allergies: 1)  ! Pcn  Anticoagulation Management History:      The patient is taking warfarin and comes in today for a routine follow up visit.  Positive risk factors for bleeding include an age of 75 years or older and presence of serious comorbidities.  The bleeding index is 'intermediate risk'.  Positive CHADS2 values include History of HTN and Age > 84 years old.  His last INR was 2.5.  Anticoagulation responsible provider: Eden Emms MD, Theron Arista.  INR POC: 1.7.  Cuvette Lot#: [id number].  Exp: 06/2011.    Anticoagulation Management Assessment/Plan:      The patient's current anticoagulation dose is Warfarin sodium 2.5 mg tabs: Use as directed by Anticoagualtion Clinic.  The target INR is 2.0-3.0.  The next INR is due 06/25/2010.  Anticoagulation instructions were given to patient/spouse.  Results were reviewed/authorized by Windell Hummingbird, RN.  He was notified by Windell Hummingbird, RN.         Prior Anticoagulation Instructions: INR 1.7 Today take extra 1/2 pill then change dose to 1 pill everyday except 1.5 pills on Mondays, Wednesdays and Fridays. Recheck in 2 weeks.   Current Anticoagulation Instructions: INR 1.7 Begin taking 1 1/2 tablets every day, except take 1 tablet on Tuesdays and Thursdays. Recheck in 2 weeks. Prescriptions: WARFARIN SODIUM 2.5 MG TABS (WARFARIN SODIUM) Use as directed by Anticoagualtion Clinic  #40 x 3   Entered by:   Windell Hummingbird   Authorized by:    Colon Branch, MD, [facility]   Signed by:   Windell Hummingbird on 06/11/2010   Method used:   [facility]        [facility]  [place] [facility]retail)       [address]       [place], [place]  [address]       Ph: [phone] or [phone]       Fax: [phone]   RxID:   [id number]

## 2010-06-16 ENCOUNTER — Telehealth: Payer: Self-pay | Admitting: Family Medicine

## 2010-06-17 ENCOUNTER — Telehealth: Payer: Self-pay | Admitting: Family Medicine

## 2010-06-21 LAB — PROTIME-INR: INR: 2.22 — ABNORMAL HIGH (ref 0.00–1.49)

## 2010-06-22 NOTE — Letter (Signed)
Summary: CMN for [facility]  CMN for [facility]   Imported By: Sherian Rein 06/15/2010 07:39:32  _____________________________________________________________________  External Attachment:    Type:   Image     Comment:   External Document

## 2010-06-22 NOTE — Progress Notes (Signed)
  Phone Note From Other Clinic Call back at [phone]   Caller: [facility]  Summary of Call: Pam called to say that she called Mrs Williard to schedule Mr [facility] Nephrology appt and she refused the appt saying that his cardiologist said that he doesnt need this appt . They will shred all the records unless the patient calls them back and wants to schedule this appt. do you want to cancel this order? Pls advise? Marion Initial call taken by: Carlton Adam,  June 17, 2010 4:26 PM  Follow-up for Phone Call        yes go ahead and cancel-thanks  Follow-up by: Judith Part MD,  June 18, 2010 1:02 PM

## 2010-06-22 NOTE — Progress Notes (Signed)
  Phone Note Call from Patient   Caller: Spouse Summary of Call: Called pt to update on Nephrology consult that we started in January with [facility]. Called to check on appt. They said that they still are waiting for the May appts to come out and that it would probably be then . Mrs Tuman said that Dr Eden Emms didnt think it was necessary to go to this at his last appt with him. I asked Mrs Stailey to call you if they decide not to go to this appt and she said she would. I will call them when appt is made. Initial call taken by: Carlton Adam,  June 16, 2010 4:07 PM  Follow-up for Phone Call        ok - I appreciate the heads up on that Follow-up by: Judith Part MD,  June 16, 2010 4:34 PM

## 2010-06-25 ENCOUNTER — Ambulatory Visit (INDEPENDENT_AMBULATORY_CARE_PROVIDER_SITE_OTHER): Payer: Medicare HMO | Admitting: *Deleted

## 2010-06-25 DIAGNOSIS — Z7901 Long term (current) use of anticoagulants: Secondary | ICD-10-CM

## 2010-06-25 DIAGNOSIS — I4891 Unspecified atrial fibrillation: Secondary | ICD-10-CM

## 2010-06-25 NOTE — Patient Instructions (Addendum)
INR 1.7 Today take extra 1/2 pill then change dose to 1.5 pills everyday except 1 pill on Thursdays. Recheck in 2 weeks.

## 2010-06-26 ENCOUNTER — Other Ambulatory Visit: Payer: Self-pay | Admitting: Family Medicine

## 2010-06-28 ENCOUNTER — Other Ambulatory Visit: Payer: Self-pay | Admitting: Emergency Medicine

## 2010-06-28 NOTE — Telephone Encounter (Signed)
Dr Eden Emms stopped Lopid 04/23/10. Please take off auto refill. Spoke with pt's wife and verified.

## 2010-06-29 NOTE — Telephone Encounter (Signed)
[facility] Re[facility] [place] phoned and wanted to know the status of refill request from yesterday. Patient is there now and he needs his inhailer he is out. She can be reached at [phone]

## 2010-07-07 ENCOUNTER — Ambulatory Visit (INDEPENDENT_AMBULATORY_CARE_PROVIDER_SITE_OTHER): Payer: Medicare HMO | Admitting: *Deleted

## 2010-07-07 DIAGNOSIS — Z7901 Long term (current) use of anticoagulants: Secondary | ICD-10-CM

## 2010-07-07 DIAGNOSIS — I4891 Unspecified atrial fibrillation: Secondary | ICD-10-CM

## 2010-07-07 LAB — POCT INR

## 2010-07-21 ENCOUNTER — Encounter: Payer: Medicare HMO | Admitting: *Deleted

## 2010-07-22 ENCOUNTER — Ambulatory Visit (INDEPENDENT_AMBULATORY_CARE_PROVIDER_SITE_OTHER): Payer: Medicare HMO | Admitting: *Deleted

## 2010-07-22 DIAGNOSIS — I4891 Unspecified atrial fibrillation: Secondary | ICD-10-CM

## 2010-07-22 DIAGNOSIS — Z7901 Long term (current) use of anticoagulants: Secondary | ICD-10-CM

## 2010-07-22 LAB — POCT INR: INR: 2.2

## 2010-08-02 ENCOUNTER — Other Ambulatory Visit: Payer: Self-pay | Admitting: Family Medicine

## 2010-08-02 ENCOUNTER — Other Ambulatory Visit: Payer: Self-pay | Admitting: Cardiovascular Disease

## 2010-08-03 ENCOUNTER — Other Ambulatory Visit: Payer: Self-pay | Admitting: *Deleted

## 2010-08-03 MED ORDER — SERTRALINE HCL 25 MG PO TABS: 25.0000 mg | ORAL_TABLET | Freq: Every day | ORAL | Status: DC

## 2010-08-04 ENCOUNTER — Other Ambulatory Visit: Payer: Self-pay | Admitting: *Deleted

## 2010-08-04 MED ORDER — LANSOPRAZOLE 30 MG PO TBDP: 30.0000 mg | ORAL_TABLET | Freq: Two times a day (BID) | ORAL | Status: DC

## 2010-08-04 MED ORDER — METOPROLOL TARTRATE 50 MG PO TABS: 50.0000 mg | ORAL_TABLET | Freq: Two times a day (BID) | ORAL | Status: DC

## 2010-08-04 NOTE — Telephone Encounter (Signed)
Medication phoned to [facility] [place] [facility] as instructed.  

## 2010-08-04 NOTE — Telephone Encounter (Signed)
Px written for call in   

## 2010-08-04 NOTE — Telephone Encounter (Signed)
Received refill request for Meloxicam. Dr Milinda Antis said med is toxic to kidney and not optimal with heart problems also can cause problems with stomach. Dr Milinda Antis has discussed with pt before. Dr Milinda Antis would rather pt not take at all. Spoke with pt's wife and she said she knew the above info and Dr Eden Emms had started pt back on the Meloxicam and the pharmacy was supposed to contact Dr Eden Emms and not Dr Milinda Antis. Pt's wife said she would call pharmacy.

## 2010-08-04 NOTE — Telephone Encounter (Signed)
This medicine is toxic to kidney and not optimal with heart problems , also can cause problems with stomach - we have disc it before I would rather he did not take it at all

## 2010-08-05 ENCOUNTER — Other Ambulatory Visit: Payer: Self-pay | Admitting: Cardiovascular Disease

## 2010-08-17 NOTE — Assessment & Plan Note (Signed)
[facility]                            CARDIOLOGY OFFICE NOTE   NAME:ELLISMarcin, Holte                        MRN:          [id number]  DATE:12/12/2006                            DOB:          1920-06-17    Adam [name] returns today for followup.  I have seen him in the past for  hypertension, hypercholesterolemia and aortic stenosis.   Adam [name] is quite the character, he tends to minimize his symptoms despite  his age.  He is quite active, he golfs on a regular basis and gets  around quite well.  He has had some atypical symptoms since I last saw  him.  He has had a little bit of chest pain which does not sound anginal  or related to his valve.  It is central in his chest, it is infrequent,  it is not progressive, it is sharp, it is not associated with any  diaphoresis.  He also has had occasional exertional dyspnea but nothing  too unusual for his age.  He golfs on a regular basis, walks on a  regular basis and does what he calls a Marine-type workout without  difficulty.   He has been compliant with his medications.  He was asking for refills  on his Prevacid and Cozaar.   REVIEW OF SYSTEMS:  Remarkable for progressive dysphagia, he has not had  this looked into formally.  He has a dysphagia to both liquids and  solids.  It does sound somewhat severe and I did recommend for him to  have an EGD.  His current review of systems otherwise negative.   CURRENT MEDICATIONS:  1. Prevacid 30 a day.  2. Gemfibrozil 600 b.i.d.  3. Ambien p.r.n.  4. Aspirin a day.  5. Cozaar 50 a day.   His last echocardiogram was done in 2007 and showed a normal EF with  moderate aortic stenosis.  His mean gradient was 21 mmHg.   EXAMINATION:  Remarkable for a blood pressure of 120/70, pulse 70 and  regular, respiratory rate is 14, he is afebrile, weight is 199.  HEENT:  Normal.  Carotids have no parvus and no tardus.  There is a  transmitted murmur.  There is no JVP elevation,  no lymphadenopathy and  no thyromegaly.  LUNGS:  Clear with good diaphragmatic motion, no wheezing.  There is an S1, second heart sound is muffled but preserved.  On the  right lateral sternal border there is a moderate AS murmur, I can not  hear aortic insufficiency.  PMI is increased but not laterally  displaced.  ABDOMEN:  Benign, bowel sounds are positive.  No tenderness, no AAA, no  hepatosplenomegaly and no hepatojugular reflux.  Femorals are +3 bilaterally without bruit, PTs are +2.  There is no  lower extremity edema.  NEURO:  Nonfocal.  There is no muscular weakness.  Affect is  appropriate.  SKIN:  Warm and dry.   EKG showed sinus rhythm, poor R wave progression, left axis deviation.   IMPRESSION:  1. Aortic stenosis, probably asymptomatic, fairly active lifestyle.      Followup  echo in a year.  No need for subacute bacterial      endocarditis prophylaxis.  2. Significant dysphagia, progressive to solids and liquids, followup      with gastroenterology.  Probably will need an      esophagogastroduodenoscopy.  3. Hypercholesterolemia in the setting of valve disease.  He has been      INTOLERANT TO STATINS IN THE PAST.  He has no known coronary      disease.  He will continue his gemfibrozil 600 b.i.d., followup      liver test in a year.  4. Hypertension, currently stable.  Blood pressure well controlled.      He is on Cozaar.  As his aortic stenosis progresses we may have to      switch this since he will have fixed stenosis at the valve level.      However, he is currently not having any significant presyncope and      I think the Cozaar is doing a wonderful job controlling his blood      pressure.  5. Gastroesophageal reflux disease in the setting of dysphagia.      Continue Prevacid 30 mg a day, refills given.  Follow up with      gastroenterology.   I will see him back in 6 months.     [name]. Eden Emms, MD, [facility]  Electronically Signed    PCN/MedQ  DD:  12/12/2006  DT: 12/13/2006  Job #: [id number]

## 2010-08-17 NOTE — Letter (Signed)
June 29, 2007    [facility] Prescription Drug Coverage Plan   RE:  VED, MARTOS  MRN:  [id number]  /  DOB:  12-27-1920   Member ID:  Jacklynn Barnacle   To Whom It May Concern:   Mr. Latouche is a patient of mine.  He requires Prevacid 30 mg twice daily  to control his reflux, dysmotility and dysphagia problems.  He was  previously on single dose Prevacid daily for a number of years, but  began to experience breakthrough difficulty with reflex symptoms.  These  have been reasonably well controlled on the b.i.d. Prevacid.  He  recently received a denial of the b.i.d. Prevacid.   It is my expert medical opinion as a gastroenterologist that he needs  the Prevacid twice daily to control his gastroesophageal reflux disease.   I would appreciate if you would support my medical plan for this patient  and provide the Prevacid 30 mg b.i.d. with full benefits.    Sincerely,      Iva Boop, MD,FACG  Electronically Signed    CEG/MedQ  DD: 06/29/2007  DT: 06/29/2007  Job #: [id number]   CC:    Garnette Scheuermann, Mr.

## 2010-08-17 NOTE — Assessment & Plan Note (Signed)
[facility]                            CARDIOLOGY OFFICE NOTE   NAME:ELLISKosta, Schnitzler                        MRN:          [id number]  DATE:04/29/2008                            DOB:          04-17-1920    Mr. Adam [name] returns today for followup.  He is a very well-preserved  octogenarian who has severe aortic stenosis.  Given his advanced age, we  have not been aggressive about addressing this, since he can only be  handled with open heart surgery.  I have talked to Adam [name] at length in  the past about compassionate percutaneous interventions.  The [facility] is now doing a trial and I suspect it will have one soon.  Currently, the patient is not having active symptoms as far as I can  tell.  He gets occasional atypical chest pain which sound is  musculoskeletal.  He has occasional exertional dyspnea which sounds  functional for his age and clearly there has not been any syncope.  No  congestive heart failure and no previously documented coronary artery  disease or angina responsive to nitrates.   The patient got a blood pressure machine at home.  He was little bit  concerned about his diastolic readings being low.  I reassured him about  these in the past.  He was on both Cozaar and Norvasc and I did not  think he needed both.  He is just taking Cozaar, however, he is taking  it at night and I suggested that he take it in the morning.   Otherwise, he is on some cholesterol medicine and his LDLs have been  under 80.   REVIEW OF SYSTEMS:  Otherwise unremarkable.  In particular, he is not  having syncope, palpitations, PND, or orthopnea.  There has been no  fevers.   CURRENT MEDICATIONS:  1. Gemfibrozil 600 b.i.d.  2. Stool softener.  3. Aspirin a day.  4. Cozaar 50 a day.  5. Prevacid 30 b.i.d.  6. Fish oil.  7. Mobic.  8. Meloxicam.   The patient has had a previous esophageal problems.  He has had a  stricture dilated by Dr. Leone [name], I believe  in 2006.  He was asking me  about the need for followup colonoscopy.  I do not think he necessarily  needs these as this one in 2006 showed no significant polyps or cancer.  However, he does have some dysphagia to pills and if this worsens, I  encouraged him to follow up with Dr. Leone [name] regarding possible EGD.   PHYSICAL EXAMINATION:  VITAL SIGNS:  Remarkable for stable weight is of  199, blood pressure 140/70, pulse 72 and regular, respiratory rate 14,  and afebrile.  HEENT:  Unremarkable.  NECK:  Carotids have parvus et tardus with a referred murmur.  No JVP  elevation, lymphadenopathy, or thyromegaly.  LUNGS:  Clear.  Good diaphragmatic motion.  No wheezing.  S1 and second  heart sound is muffled.  There is a severe AS murmur.  No AI.  PMI  normal.  ABDOMEN:  Benign.  Bowel sounds positive.  No AAA.  No tenderness.  No  bruit.  No hepatosplenomegaly.  No hepatojugular reflux.  EXTREMITIES:  Distal pulses are intact.  No edema.  NEUROLOGIC:  Nonfocal.  SKIN:  Warm and dry.  MUSCULOSKELETAL:  No muscular weakness.   EKG shows sinus rhythm, first-degree heart block with PR interval of  204, left axis deviation, otherwise normal.   IMPRESSION:  1. Severe aortic stenosis not an operative candidate, currently      asymptomatic.  Consider followup echo in April.  2. Prolonged PR interval.  No evidence of high-grade heart block.      Avoid atrioventricular nodal blocking drugs.  3. Hyperlipidemia.  LDL within target range.  Continue gemfibrozil.  4. Hypertension.  Currently, well controlled.  Continue current dose      of Cozaar and low-sodium diet.  5. Arthritis.  Continue Mobic p.r.n.  6. History of esophageal stricture with some dysphagia to pills.      Follow up with Dr. Leone [name] as needed.  Continue Prevacid 30 b.i.d.   I will see Adam [name] back in April when he has his echo.     [name]. Eden Emms, MD, [facility]  Electronically Signed    PCN/MedQ  DD: 04/29/2008  DT: 04/30/2008  Job  #: [id number]

## 2010-08-17 NOTE — Assessment & Plan Note (Signed)
[facility]                         GASTROENTEROLOGY OFFICE NOTE   NAME:ELLISOdarius, Dines                        MRN:          [id number]  DATE:06/12/2007                            DOB:          1920/12/24    CHIEF COMPLAINT:  Do I need a colonoscopy?   HISTORY:  Mr. Authement' brother was diagnosed with colon cancer a couple of  weeks ago.  His brother is 7.  Mr. Decesare has a history of colonoscopy  performed by me in 2004.  He has known external hemorrhoids and  diverticulosis.  Ever since then, when he has a hard stool or  constipation, he will have some rectal bleeding.  This is rare.  It is  not anything new.  He had a polyp in 2002, but there was no pathology,  i.e. no specimen.  His symptoms have really been stable.  He has  esophageal dysmotility, causing dysphagia, and things are stable there,  as well.  He is 86, but overall does reasonably well.  He has to use  Mobic intermittently for low back pain and Dr. Milinda Antis has cautioned him  on over-using that.   MEDICATIONS:  Are listed and reviewed in the chart, and include Cozaar,  aspirin, Ambien, stool softener, gemfibrozil and Prevacid.   ALLERGIES:  He is allergic to PENICILLIN and has sensitivity to STATINS.   PHYSICAL:  Weight 203 pounds, pulse 78, blood pressure 132/60.  Rectal exam shows some formed, brown stool, but no mass.  There is some  mild anal stenosis.  Prostate did not seem to be enlarged to me.  There  is no nodule.   ASSESSMENT:  1. Family history of colon cancer.  His brother is elderly,      technically.  I am not sure it raises Mr. Perrell' risk profile at      this point.  2. Chronic rare intermittent rectal bleeding with known external      hemorrhoids on colonoscopy, 2004.  3. Esophageal dysmotility, which is stable.  He manages reasonably      well with occasional problems.  I do not think we can do much      better than that at this point.   PLAN:  I will put him in  for a recall to discuss a colonoscopy in  October, which will be five years since his last one.  It may be  reasonable to do so, given his new family history issue.  It is really  hard to sort out his risk profile in that setting.  It is typically when  somebody in the family is in their early sixties, or less.  However, he  is very fit and vigorous.  It may be a reasonable thing to do and we  will revisit at that time.  This is really his plan.  He understands the  possibility of  having a lesion that has grown in the interim, but decided to wait until  October to discuss things.  I do think this is reasonable, overall.     Iva Boop, MD,FACG  Electronically  Signed    CEG/MedQ  DD: 06/12/2007  DT: 06/13/2007  Job #: [id number]   cc:   Marne A. Milinda Antis, MD

## 2010-08-17 NOTE — Assessment & Plan Note (Signed)
[facility]                            CARDIOLOGY OFFICE NOTE   NAME:Adam [name], Adam [name]                        MRN:          [id number]  DATE:06/13/2007                            DOB:          [birth year]    Adam [name] returns today for followup.  He has history of  hypercholesterolemia, dizziness, aortic stenosis, hypertension,  hypercholesterolemia.  He is actually fairly well-preserved for his age.  I do think that he is having some forgetfulness.   He has been seeing Dr. Leone Payor from time to time for his colonoscopies  and upper endoscopies for dysmotility problems.   He continues to have problems with cold liquids.   From a cardiac perspective, he is stable.  His blood pressure has been  under good control.  Dizziness does not appear to be related to his  heart and sounds more vertiginous.   There are no postural symptoms.  No chest pain or shortness of breath.  He has chronic lower extremity edema due to varicosities.   REVIEW OF SYSTEMS:  Otherwise negative.   His meds include gemfibrozil 600 b.i.d., stool softener, an aspirin a  day, Cozaar 50 a day, Prevacid 30 a day, and fish oil.   EXAM:  Remarkable for a blood pressure 110/59, pulse 78 and regular,  weight 202, afebrile, respiratory rate 14.  HEENT:  Unremarkable.  He has a left carotid bruit versus transmitted  murmur.  There is mild parvus and tardus.  There is no lymphadenopathy,  thyromegaly or JVP elevation.  LUNGS:  Clear with good diaphragmatic motion.  No wheezing.  There is an S1 with a mid peaking moderate AS murmur.  Second heart  sound is preserved.  There is no AI.  PMI normal.  ABDOMEN:  Benign.  Bowel sounds positive.  No hepatosplenomegaly or  hepatojugular reflux.  No tenderness, no bruit.  Distal pulses intact.  He has +1 edema bilaterally, left greater than  right.  He has large varicosities in right lower extremity.  NEURO:  Nonfocal.  SKIN:  Warm and dry.  No muscle  weakness.   IMPRESSION:  1. Aortic stenosis.  Follow up echo.  Despite his age he would be a      candidate for surgery.  It appears moderate by exam.  2. Hypertension currently well controlled.  Continue low dose Cozaar.  3. Right lower extremity varicose veins.  Refer to Dr. Sondra Come for      probable sclerotherapy.  4. Hypercholesterolemia.  Intolerant to STATINS.  Continue      gemfibrozil.  Lipid and liver profile in 6 months.  5. Esophageal dysmotility.  Continue Prevacid 30 b.i.d. follow up with      Dr. Leone Payor.  6. Left carotid bruit.  Carotid duplex.  Continue aspirin a day.      Hopefully he will not have greater than 80% disease and this would      be more related to transmitted murmur from his AS.     [name]. Eden Emms, MD, [facility]  Electronically Signed    PCN/MedQ  DD: 06/13/2007  DT: 06/14/2007  Job #: [id number]

## 2010-08-17 NOTE — Assessment & Plan Note (Signed)
[facility]                            CARDIOLOGY OFFICE NOTE   NAME:ELLISBridger, [name]                        MRN:          [id number]  DATE:07/09/2008                            DOB:          10/14/1920    Adam [name] returns today for followup.  He is soon to be 77 years old.  He  has severe aortic stenosis.  I reviewed his 2-D echocardiogram today.  His mean gradient was 53 and has worsened somewhat since last year.  He  had some exertional dyspnea and fatigue.  He has not had any significant  chest pain or presyncope.  I talked to [name] again and despite his  overall general good health, I think we both agreed that he would not  like to proceed with any invasive evaluation or plans for AVR.  Unfortunately, he just lost his brother this morning.  His brother had  colon cancer.  He apparently has had a polyp 5 years ago.  Almalik is due  to have a followup colonoscopy with Dr. Leone Payor.  I told him despite his  severe AS, he would be a candidate to have this.  I do not think he has  had undue risk and I advised him to follow up with Dr. Leone Payor to  proceed with this if indicated from a GI standpoint.  Otherwise, he is  doing well.  He continues to enjoys his golfing.  He lives out at [facility] area.   REVIEW OF SYSTEMS:  Otherwise negative.   MEDICATIONS:  He is on;  1. Gemfibrozil 600 b.i.d.  2. Stool softener.  3. Cozaar 50 mg.  4. Aspirin.  5. Prevacid 30 b.i.d.  6. Fish oil.  7. Mobic.  8. Meloxicam p.r.n.   PHYSICAL EXAMINATION:  GENERAL:  Remarkable for an elderly male in no  distress.  VITAL SIGNS:  Blood pressure is 130/70; pulse 80 and regular;  respiratory rate 14, afebrile; and weight 202.  HEENT:  Unremarkable.  NECK:  Carotids have parvus et tardus with a transmitted murmur.  No  lymphadenopathy, no thyromegaly, or JVP elevation.  LUNGS:  Clear, good diaphragmatic motion.  No wheezing, S1.  Second  heart sound is muffled.  There is  severe AS murmur.  PMI normal.  ABDOMEN:  Benign.  Bowel sounds positive.  No AAA.  No tenderness.  No  bruit.  No hepatosplenomegaly.  No hepatojugular reflux.  EXTREMITIES:  Distal pulses are intact.  No edema.  NEURO:  Nonfocal.  SKIN:  Warm and dry.  MUSCULOSKELETAL:  No muscular weakness.   His baseline EKG shows sinus rhythm with left axis deviation.   IMPRESSION:  1. Severe aortic stenosis, currently no plans to proceed with aortic      valve replacement.  Continue to monitor for signs of chest pain or      presyncope.  Current functional status is about his good, as this      can be expected for his age, which is one of the reasons why we are      not proceeding with surgery.  2. Hypertension, currently well controlled.  Continue current dose of      Cozaar.  We would not increase it any further.  He has tolerated      this dose for a long time, but he does have a fixed after load with      his aortic stenosis.  3. Hyperlipidemia.  Continue current dose of gemfibrozil.  4. Previous history of polyps, I believe with constipation.  Follow up      with Dr. Leone Payor.  Proceed with colonoscopy as indicated.     [name]. Eden Emms, MD, [facility]  Electronically Signed    PCN/MedQ  DD: 07/09/2008  DT: 07/10/2008  Job #: [id number]

## 2010-08-17 NOTE — Assessment & Plan Note (Signed)
[facility]                         GASTROENTEROLOGY OFFICE NOTE   NAME:Adam [name], Adam [name]                        MRN:          [id number]  DATE:02/07/2007                            DOB:          1920-11-13    CHIEF COMPLAINT:  Followup of dysphagia.   Adam [name] barium swallow showed diffuse esophageal dysmotility  problems. There was no obvious stricture and a tablet passed into the  stomach without delay. There was a small hiatal hernia with a little bit  of reflux. He had tried Prevacid in extra doses in the evening  intermittently and thinks that helped some. If he chews his food well,  eats slowly and drinks liquid he is generally okay without significant  impact dysphagia. He does admit that cold liquids and he puts in  everything makes things worse.   MEDICATIONS:  Listed and reviewed.   PAST MEDICAL HISTORY:  Reviewed and unchanged from January 12, 2007.   Height 5 feet 11, weight 199 pounds, pulse 64, blood pressure 116/60.   ASSESSMENT:  Dysphagia mostly from esophageal dysmotility. He looked  like he had a distal ring-like stricture in 2006 but the tablet passed  and he is swallowing reasonably well. Reflux may be part of the problem  i.e. cause of the dysmotility.   PLAN:  1. Take Prevacid 30 mg b.i.d. regularly.  2. Avoid cold liquids and ice.  3. Chew food carefully, eat slowly, etc.  4. If these things are not working we could perform an endoscopy and      dilate, but at this point I think he probably ought to manage      things this way as I am not convinced that a stricture is the      problem and it is questionable as to how much dilation would help      this anyway. He will come back to see me if this is not working and      we can determine whether other workup is needed, i.e. manometry      versus EGD versus Botox versus dilation.     Iva Boop, MD,FACG  Electronically Signed    CEG/MedQ  DD: 02/07/2007  DT:  02/08/2007  Job #: [id number]   cc:   Marne A. Milinda Antis, MD

## 2010-08-17 NOTE — Assessment & Plan Note (Signed)
[facility]                         GASTROENTEROLOGY OFFICE NOTE   NAME:ELLISBurdett, Pinzon                        MRN:          [id number]  DATE:01/12/2007                            DOB:          [birth year]    REASON FOR CONSULTATION:  Recurrent and persistent dysphagia.   ASSESSMENT:  Recurrent dysphagia in a man with esophageal dysmotility  and stricture.  He also has some hoarseness.  Reflux may be a component  of this.  I do not know if it is a cause or side effect of his  dysmotility.   PLAN:  1. Schedule barium swallow to evaluate motility of the esophagus.  2. Subsequent to that, likely endoscopy with dilation (I dilated him      in 2006, he cannot remember if that helped, but I am presuming it      did).  3. May need Botox or manometry to try to understand this further,      depending upon the results).  4. Increase Prevacid to b.i.d.  Samples given for the time being to      see if that helps what sounds like some proximal reflux symptoms      with the hoarseness.   HISTORY:  This is an 77 year old white man that I have seen in the past,  as outlined above.  He was seeing Dr. Eden Emms recently and complained of  worsening dysphagia, so Dr. Eden Emms suggested he come see me.  His memory  is a bit foggy about the results of his dilation in 2006 when he had  this dysmotility and esophageal stricture.  Things clearly are worse  with solids and liquids.  He has some epigastric distress at times as  well.  He has been having some hoarseness and feels like he has  difficulty clearing his throat at times and has some phlegm build up.  He feels that he is obese.  He says that he uses Mobic pretty regularly,  even though Dr. Milinda Antis would like him not to because he has stiffness in  his lower back, and that is a bit of trouble when he tries to golf and  move about.  Constitutional review of systems is otherwise negative.   PAST MEDICAL HISTORY:  1.  Esophageal dysmotility.  2. Esophageal stricture.  3. Aortic stenosis.  4. Dyslipidemia.  5. Hypertension.  6. Gastroesophageal reflux disease.  7. Mild aortic regurgitation.  8. History of tachycardia.  9. Previous asthma.  10.Osteoarthritis.  11.Possible deep venous thrombosis, right lower extremity in the past.  12.Left upper lobectomy in 1992 for lung cancer.  13.Colonoscopy showing diverticulosis and external hemorrhoids in      2004.   MEDICATIONS:  1. Prevacid 30 mg daily.  2. Gemfibrozil 600 mg b.i.d.  3. Stool softener daily.  4. Occasional Ambien 5 mg.  5. Aspirin 81 mg daily.  6. Cozaar 50 mg daily.  7. Tylenol p.r.n.  8. Mobic p.r.n.  9. Allergies or sensitivities to PENICILLIN and STATINS.   SOCIAL HISTORY:  He is a retired Optician, dispensing.  He is traveling to [place]  on  Thursday to give some talks.  He has not been a smoker, as I am aware  of at this time.  He was a former smoker.  There is no alcohol.  He is  married.  He has also worked as a Chief Financial Officer.   PHYSICAL EXAMINATION:  An elderly white man looking spry for his age.  Weight 197.  Pulse 82, blood pressure 120/60.  Eyes are anicteric.  Mouth, posterior pharynx shows some mild erythema  around the uvula and posterior pharynx, otherwise free of lesions.  NECK:  Supple without thyromegaly or mass.  No neck or supraclavicular  nodes.  CHEST:  Clear.  HEART:  S1 and S2.  There is a 3/6 systolic murmur heard best at the  left upper sternal border as well as the right upper sternal border.  No  diastolic murmur heard.  ABDOMEN:  Soft, mildly obese, nontender.  No organomegaly or mass.  He is alert and oriented x3.   I appreciate the opportunity to care for this patient.   I have reviewed the office notes from Dr. Eden Emms and my previous  records.     Iva Boop, MD,FACG  Electronically Signed    CEG/MedQ  DD: 01/12/2007  DT: 01/12/2007  Job #: [id number]   cc:   [name]. Eden Emms, MD, [facility]   Marne A. Milinda Antis, MD

## 2010-08-17 NOTE — Assessment & Plan Note (Signed)
[facility]                            CARDIOLOGY OFFICE NOTE   NAME:Adam [name], Adam [name]                        MRN:          [id number]  DATE:01/02/2008                            DOB:          14-Apr-1920    Mr. Bergum returns today in followup.  He is doing well.  Despite his  advanced age, he is still active, he golfs on a regular basis.   He has significant aortic stenosis.  I reviewed his 2D echocardiogram  from March with an mean gradient was 40, peak gradient was 63.   He has normal LV function with an EF of 55%.   His AS is moderate to severe.   I had a long discussion with Gaberial today.  Given his advanced age of 77,  a decision would need to be made about his valve.  I think the  appropriate decision is not to refer him to surgery.  He has some minor  symptoms that I think are more related to his age.  He clearly has not  had significant chest pain, presyncope or dyspnea disproportionate to  his functional status.  He also understands that if we do not proceed  with any surgical intervention at this time, he would not likely be a  candidate as he reaches his 90s.   I think Arend agrees with this.   He is otherwise doing fairly well.  He has had chronic varicosities in  lower extremities.  They are quite severe, but he does not want to have  any sclerosis or vein treatment.   REVIEW OF SYSTEMS:  Otherwise positive for some reflux which he takes  Prevacid for.   He continues to be bothered by this and sees Dr. Leone Payor on occasion.  He has significant arthritis and some weakness in his hands.  Mobic  seems to help, I told him this would be fine.   He is intolerant to STATINS.   MEDICATIONS:  1. Gemfibrozil 600 b.i.d.  2. Aspirin.  3. Cozaar 50 a day.  4. Prevacid 30 b.i.d.  5. Fish oil.  6. Mobic.   PHYSICAL EXAMINATION:  GENERAL:  Remarkable for jovial elderly male who  looks younger than his stated age.  VITAL SIGNS:  His blood  pressure is 130/80, pulse 70 and regular, weight  is 199.  HEENT:  Unremarkable.  NECK:  Carotids have a transmitted murmur.  Mild parvus et tardus.  No  lymphadenopathy, thyromegaly or JVP elevation.  LUNGS:  Clear.  Good diaphragmatic motion.  No wheezing.  CARDIAC:  S1, S2 is definitely preserved and there is a moderate AS  murmur, mild aortic insufficiency murmur.  PMI normal.  ABDOMEN:  Benign.  Bowel sounds positive.  No AAA.  No tenderness.  No bruit.  No  hepatosplenomegaly or hepatojugular reflux.  EXTREMITIES:  Distal pulses  are intact.  He has marked varicosities in both thighs and both legs  with significant reflux.  NEUROLOGICAL:  Nonfocal.  SKIN:  Warm and dry.  MUSCULOSKELETAL:  No muscular weakness.   EKG shows sinus rhythm with left axis  deviation, poor R-wave  progression.   IMPRESSION:  1. Moderate-to-severe aortic stenosis in an 77 year old patient.      Continue medical therapy.  Currently, no indication for surgery.  2. Transmitted murmur to the neck.  Carotid duplex study showing no      significant stenosis.  No need to repeat.  3. Hypertension, currently well controlled.  Continue Cozaar.  4. Superficial varicosities.  Continue to wear support hose which he      actually had on today.  The patient will make up his own mind when      and if he ever wants to see [facility] for scler[facility].      Signs and symptoms of phlebitis discussed with the patient.  5. Reflux.  Follow up with Dr. Leone Payor.  Continue Prevacid.  Consider      switching to Protonix if symptoms worsen.  6. Arthritis.  Continue Mobic.  Naprosyn would be reasonable      alternative.  I will see him back in 6 months.     [name]. Eden Emms, MD, [facility]  Electronically Signed    PCN/MedQ  DD: 01/02/2008  DT: 01/02/2008  Job #: [id number]

## 2010-08-19 ENCOUNTER — Ambulatory Visit (INDEPENDENT_AMBULATORY_CARE_PROVIDER_SITE_OTHER): Payer: Medicare HMO | Admitting: *Deleted

## 2010-08-19 DIAGNOSIS — I4891 Unspecified atrial fibrillation: Secondary | ICD-10-CM

## 2010-08-19 DIAGNOSIS — Z7901 Long term (current) use of anticoagulants: Secondary | ICD-10-CM

## 2010-08-19 LAB — POCT INR: INR: 2.4

## 2010-08-20 NOTE — Assessment & Plan Note (Signed)
[facility]                              CARDIOLOGY OFFICE NOTE   NAME:Adam [name], Adam [name]                        MRN:          [id number]  DATE:12/07/2005                            DOB:          12/24/1920    [name] returns today for followup.  He is doing well.  He continues to golf  and enjoy his lifestyle out by [facility].  He has moderate AS and mild  AR.  He is in his early 77s, and hopefully this will never progress to the  point where it becomes symptomatic.  His dizziness has improved since we  stopped his Norvasc and adjusted his medications.   He has no previously documented coronary disease.  He has had previous lung  cancer with left thoracotomy.   He is not having any PND, orthopnea, or shortness of breath, chest pain, and  his dizziness is improved.   PHYSICAL EXAMINATION:  GENERAL:  He looks well.  VITAL SIGNS:  The blood pressure is 127/70, pulse 80 and regular.  LUNGS:  Clear.  He has a left thoracotomy scar.  CARDIOVASCULAR:  Carotids are normal.  There is some minimal __________.  There is an S1 with a mid peaking systolic ejection murmur of moderate AS.  S2 is well preserved.  There is mild aortic insufficiency murmur.  ABDOMEN:  Benign.  He is status post cholecystectomy.  EXTREMITIES:  Distal pulses are intact with no edema.   IMPRESSION:  Stable moderate aortic stenosis with mild aortic regurgitation.  Symptoms improved with stopping of calcium blocker.   Continue medical therapy as warranted.  Patient will follow up with Dr.  Milinda Antis for his other general medical problems.  From our perspective, we will  maintain him on just low dose Cozaar.  He is taking a baby aspirin a day.  He continues to prefer to take Mobic for his arthritis, despite some risks  in regards to his cardiac status, as it improves his lifestyle and allows  his golf.                               [name]. Eden Emms, MD, [facility]    PCN/MedQ  DD:   12/07/2005  DT:  12/07/2005  Job #:  [id number]

## 2010-08-23 ENCOUNTER — Ambulatory Visit (INDEPENDENT_AMBULATORY_CARE_PROVIDER_SITE_OTHER): Payer: Medicare HMO | Admitting: Family Medicine

## 2010-08-23 ENCOUNTER — Encounter: Payer: Self-pay | Admitting: Family Medicine

## 2010-08-23 VITALS — BP 122/70 | HR 88 | Temp 97.7°F | Wt 174.0 lb

## 2010-08-23 DIAGNOSIS — M549 Dorsalgia, unspecified: Secondary | ICD-10-CM

## 2010-08-23 MED ORDER — HYDROCODONE-ACETAMINOPHEN 5-500 MG PO TABS: 0.5000 | ORAL_TABLET | Freq: Three times a day (TID) | ORAL | Status: AC | PRN

## 2010-08-23 NOTE — Progress Notes (Signed)
Fell early Sat AM.  Was going down steps in the home.  No LOC.  Twisted as he fell and hit back on the steps.  Able to walk at that time w/o trouble.  Pain increased since then.  He had some left over vicodin and took this with relief yesterday w/o ADE.  Asking for advice.  Pain on R lower back, not midline.  Meds, vitals, and allergies reviewed.   ROS: See HPI.  Otherwise, noncontributory.  On O2, pleasant in conversation ncat Neck supple rrr ctab Back w/o midline pain R paraspinal lower T spine and upper L spine tenderness. No pain on rib palpation or deep breath No back bruising Elbows with superficial and nonbleeding abrasions that are dressed.

## 2010-08-23 NOTE — Assessment & Plan Note (Signed)
>  25 min spent with face to face with patient. Nontoxic, no midline pain and no indication for imaging.  Likely muscle strain, unlikely rib fx. Even if he did have rib fx, imaging unlikely to change mgmt.  D/w pt and he understood.  Can use vicodin sparingly and this should gradually improve.  I talked with him and his wife, daughter about relocating to bedroom on 1st floor and avoiding steps. They understood.

## 2010-08-23 NOTE — Patient Instructions (Signed)
Take the vicodin as needed.  It can make you drowsy.  Take colace if needed for constipation.  I would think about using a bedroom on the main floor and avoid the stairs at least for now.  The pain should gradually get better.

## 2010-09-17 ENCOUNTER — Encounter: Payer: Medicare HMO | Admitting: *Deleted

## 2010-10-07 ENCOUNTER — Ambulatory Visit (INDEPENDENT_AMBULATORY_CARE_PROVIDER_SITE_OTHER): Payer: Medicare HMO | Admitting: *Deleted

## 2010-10-07 DIAGNOSIS — Z7901 Long term (current) use of anticoagulants: Secondary | ICD-10-CM

## 2010-10-07 DIAGNOSIS — I4891 Unspecified atrial fibrillation: Secondary | ICD-10-CM

## 2010-10-07 LAB — POCT INR: INR: 1.9

## 2010-10-22 ENCOUNTER — Encounter: Payer: Self-pay | Admitting: Internal Medicine

## 2010-10-28 ENCOUNTER — Encounter: Payer: Self-pay | Admitting: Cardiovascular Disease

## 2010-10-28 ENCOUNTER — Ambulatory Visit (INDEPENDENT_AMBULATORY_CARE_PROVIDER_SITE_OTHER): Payer: Medicare HMO | Admitting: Cardiovascular Disease

## 2010-10-28 DIAGNOSIS — J8410 Pulmonary fibrosis, unspecified: Secondary | ICD-10-CM

## 2010-10-28 DIAGNOSIS — I2510 Atherosclerotic heart disease of native coronary artery without angina pectoris: Secondary | ICD-10-CM

## 2010-10-28 DIAGNOSIS — E785 Hyperlipidemia, unspecified: Secondary | ICD-10-CM

## 2010-10-28 DIAGNOSIS — I4891 Unspecified atrial fibrillation: Secondary | ICD-10-CM

## 2010-10-28 DIAGNOSIS — I359 Nonrheumatic aortic valve disorder, unspecified: Secondary | ICD-10-CM

## 2010-10-28 DIAGNOSIS — I10 Essential (primary) hypertension: Secondary | ICD-10-CM

## 2010-10-28 NOTE — Progress Notes (Signed)
Adam [name] is an 77 yo male with afib, AVR, CAD. He was cardioverted him in June and he is maintaining NSR. He has a history of diastolic dysfunction and intolerance to afib. He has good LV function and a normal functioning AVR. He has been on an adequate dose of Lasix for mildly elevated BNP with higher doses causing prerenal azotemia. His CXR shows diffuse interstitial lung disease and he had pre-existing COPD before his CABG/AVR at [facility] in March. He finished cardiac rehab at [facility] and he may be a candidate for pulmonary rehab. He called in recently with a cold right leg a few nights ago. No pain. No ulcers. He denies claudication. He does have severe varicosities, esp. in his right leg. He used to wear compression hose.  Has appt with SK in 2 weeks to check pacer  ROS: Denies fever, malais, weight loss, blurry vision, decreased visual acuity, cough, sputum, SOB, hemoptysis, pleuritic pain, palpitaitons, heartburn, abdominal pain, melena, lower extremity edema, claudication, or rash.  All other systems reviewed and negative  General: Affect appropriate Healthy:  appears stated age HEENT: normal Neck supple with no adenopathy JVP normal no bruits no thyromegaly Lungs clear with no wheezing Decreased breath sounds rigth with  Previous thoracotomy scar  and good diaphragmatic motion Heart:  S1/S2 SEM  Murmur no ,rub, gallop or click PMI normal Abdomen: benighn, BS positve, no tenderness, no AAA no bruit.  No HSM or HJR Distal pulses intact with no bruits No edema Neuro non-focal Skin warm and dry No muscular weakness   Current Outpatient Prescriptions  Medication Sig Dispense Refill  . acetaminophen (TYLENOL) 650 MG CR tablet Take 3 tablets by mouth daily as needed.       Marland Kitchen amiodarone (PACERONE) 200 MG tablet Take 200 mg by mouth daily.        Marland Kitchen aspirin 81 MG tablet Take 81 mg by mouth daily.        . colchicine 0.6 MG tablet Take 0.6 mg by mouth 2 (two) times daily as needed.          . diphenhydrAMINE (BENADRYL) 25 MG tablet Take 25 mg by mouth every 6 (six) hours as needed.        . docusate sodium (COLACE) 100 MG capsule Take 100 mg by mouth 2 (two) times daily as needed.        . furosemide (LASIX) 40 MG tablet Take 40 mg by mouth daily.        Marland Kitchen HYDROcodone-acetaminophen (VICODIN) 5-500 MG per tablet As needed      . lansoprazole (PREVACID SOLUTAB) 30 MG disintegrating tablet Take 1 tablet (30 mg total) by mouth 2 (two) times daily.  60 tablet  6  . levalbuterol (XOPENEX HFA) 45 MCG/ACT inhaler Inhale 1-2 puffs into the lungs every 6 (six) hours as needed.        . meloxicam (MOBIC) 7.5 MG tablet TAKE 1 TABLET BY MOUTH ONCE A DAY AS NEEDED  90 tablet  1  . metoprolol (LOPRESSOR) 50 MG tablet Take 1 tablet (50 mg total) by mouth 2 (two) times daily.  60 tablet  11  . senna (SENOKOT) 8.6 MG tablet Take 1 tablet by mouth daily as needed.        . sertraline (ZOLOFT) 25 MG tablet Take 1 tablet (25 mg total) by mouth daily.  30 tablet  6  . SYMBICORT 160-4.5 MCG/ACT inhaler INHALE 2 PUFFS TWICE DAILY. RINSE MOUTH WELL AFTER USE  1 Inhaler  2  . warfarin (COUMADIN) 2.5 MG tablet Take by mouth as directed.          Allergies  Penicillins  Electrocardiogram:  Assessment and Plan

## 2010-10-28 NOTE — Assessment & Plan Note (Signed)
Cholesterol is at goal.  Continue current dose of statin and diet Rx.  No myalgias or side effects.  F/U  LFT's in 6 months. Lab Results  Component Value Date   LDLCALC 48 04/09/2010

## 2010-10-28 NOTE — Assessment & Plan Note (Signed)
Well controlled.  Continue current medications and low sodium Dash type diet.    

## 2010-10-28 NOTE — Patient Instructions (Signed)
Your physician recommends that you schedule a follow-up appointment in: 6 MONTHS WITH DR NISHAN  Your physician recommends that you continue on your current medications as directed. Please refer to the Current Medication list given to you today. 

## 2010-10-28 NOTE — Assessment & Plan Note (Signed)
Maint NSR continue coumadin  

## 2010-10-28 NOTE — Assessment & Plan Note (Signed)
S/P tissue AVR normal exam.  SBE prophylaxis

## 2010-10-28 NOTE — Assessment & Plan Note (Signed)
Main limiting factor is dyspnea mostly related to previous thoracotomy and ILD  Continue inhalers. F/U pulmonary

## 2010-10-28 NOTE — Assessment & Plan Note (Signed)
Post CABG no angina Continue ASA

## 2010-11-01 ENCOUNTER — Other Ambulatory Visit: Payer: Self-pay | Admitting: Cardiovascular Disease

## 2010-11-05 ENCOUNTER — Ambulatory Visit (INDEPENDENT_AMBULATORY_CARE_PROVIDER_SITE_OTHER): Payer: Medicare HMO | Admitting: *Deleted

## 2010-11-05 DIAGNOSIS — I4891 Unspecified atrial fibrillation: Secondary | ICD-10-CM

## 2010-11-05 DIAGNOSIS — Z7901 Long term (current) use of anticoagulants: Secondary | ICD-10-CM

## 2010-11-10 ENCOUNTER — Other Ambulatory Visit: Payer: Self-pay | Admitting: *Deleted

## 2010-11-10 MED ORDER — FUROSEMIDE 40 MG PO TABS: 40.0000 mg | ORAL_TABLET | Freq: Every day | ORAL | Status: DC

## 2010-11-22 ENCOUNTER — Other Ambulatory Visit: Payer: Self-pay

## 2010-11-22 ENCOUNTER — Other Ambulatory Visit: Payer: Self-pay | Admitting: *Deleted

## 2010-11-22 MED ORDER — COLCHICINE 0.6 MG PO TABS: 0.6000 mg | ORAL_TABLET | Freq: Two times a day (BID) | ORAL | Status: DC | PRN

## 2010-11-22 NOTE — Telephone Encounter (Signed)
Was this already refilled- looks like it was - and message says it was done in "future encounter"-- ? Know what that means Is fine to refil #60

## 2010-11-22 NOTE — Telephone Encounter (Signed)
Spoke with April at [facility] and Dr Eden Emms has already refilled Colcrys.

## 2010-11-22 NOTE — Telephone Encounter (Signed)
[facility] faxed refill request for Colcrys 0.6mg  #60 with instructions take 1 tab by mouth twice daily as needed.Please advise.Marland Kitchen

## 2010-11-25 ENCOUNTER — Ambulatory Visit (INDEPENDENT_AMBULATORY_CARE_PROVIDER_SITE_OTHER): Payer: Medicare HMO | Admitting: *Deleted

## 2010-11-25 ENCOUNTER — Encounter: Payer: Self-pay | Admitting: Internal Medicine

## 2010-11-25 ENCOUNTER — Ambulatory Visit (INDEPENDENT_AMBULATORY_CARE_PROVIDER_SITE_OTHER): Payer: Medicare HMO | Admitting: Internal Medicine

## 2010-11-25 DIAGNOSIS — I495 Sick sinus syndrome: Secondary | ICD-10-CM

## 2010-11-25 DIAGNOSIS — Z950 Presence of cardiac pacemaker: Secondary | ICD-10-CM | POA: Insufficient documentation

## 2010-11-25 DIAGNOSIS — I442 Atrioventricular block, complete: Secondary | ICD-10-CM | POA: Insufficient documentation

## 2010-11-25 DIAGNOSIS — Z7901 Long term (current) use of anticoagulants: Secondary | ICD-10-CM

## 2010-11-25 DIAGNOSIS — I4891 Unspecified atrial fibrillation: Secondary | ICD-10-CM

## 2010-11-25 DIAGNOSIS — R0602 Shortness of breath: Secondary | ICD-10-CM

## 2010-11-25 LAB — PACEMAKER DEVICE OBSERVATION
AL AMPLITUDE: 1.4 mv
AL IMPEDENCE PM: 305 Ohm
AL THRESHOLD: 0.5 V
BAMS-0003: 70 {beats}/min
BATTERY VOLTAGE: 2.81 V
RV LEAD IMPEDENCE PM: 387 Ohm

## 2010-11-25 LAB — POCT INR: INR: 2.7

## 2010-11-25 NOTE — Assessment & Plan Note (Signed)
The patient's device was interrogated.  The information was reviewed. The device was reprogrammed to allow for intrinisic conduction

## 2010-11-25 NOTE — Assessment & Plan Note (Signed)
trhis is intermittent as conducting today with AV delay of 250  Will reprogram AV delay to allow for intrinsic ocnduction and we can see if that improves dyspnea

## 2010-11-25 NOTE — Patient Instructions (Signed)
Your physician wants you to follow-up in: 6 months with Kristin/ Paula & 1 year with Dr. Klein. You will receive a reminder letter in the mail two months in advance. If you don't receive a letter, please call our office to schedule the follow-up appointment.  Your physician recommends that you continue on your current medications as directed. Please refer to the Current Medication list given to you today.  

## 2010-11-25 NOTE — Progress Notes (Signed)
HPI  Adam [name] is a 77 y.o. male seen in follow up for pacemaker implanted in the context aVR and CABG at [facility] in February 2011. The procedure was complicated by complete heart block requiring permanent pacemaker implantation. Postoperative echo yesterday demonstrated ejection fraction of 50%. There is moderate right ventricular dilatation and diastolic dysfunction.    The patient has problems with shortness of breath. There has been a plateau in his recovery about 4 weeks ago. Chest CT or chest x-ray raised the possibility of pulmonary fibrosis evaluation which is ongoing.  He has a history of atrial fibrillation perirocedurally for which he's been treated with amiodarone but not Coumadin.    Past Medical History  Diagnosis Date  . Cancer     Lung  . COPD (chronic obstructive pulmonary disease)   . Diverticulosis of colon   . GERD (gastroesophageal reflux disease)     Stricture  . Hyperlipidemia   . Hypertension   . Esophageal dysmotility   . Osteoarthritis   . Aortic stenosis   . Mild renal insufficiency   . Erectile dysfunction   . Venous insufficiency   . Gout   . BPH (benign prostatic hypertrophy)   . Abnormal stress test 09/2001    Pos.  AS on Echo, LVH    Past Surgical History  Procedure Date  . Aortic valve replacement 05/04/09    with CABG x 3  . Pacemaker insertion 05/08/09    [employer], dual chamber  . Cholecystectomy   . Tonsillectomy   . Lobectomy 1991    Lung  . Prostate surgery 1990  . Thumb surgery     Left  . Varicose vein surgery   . Basal cell surgery     Nose  . Esophagogastroduodenoscopy 10/2005    Stricture  . Cardiolite 11/2003    Neg.  Mild to moderate AS  . Carotid US 11/2005    Neg  . Esophagogastroduodenoscopy 07/2004    Gastritis, stricture, dysmotility  . Rib films 08/2005    Right 10th rib fracture  . Doppler echocardiography 09/2005    Stable  . 2d echocardiogram 06/2007    Mod aortic stenosis with EF 55%  .  Colonoscopy     diverticulosis, small polyp, hemorrhoids    Current Outpatient Prescriptions  Medication Sig Dispense Refill  . acetaminophen (TYLENOL) 650 MG CR tablet Take 3 tablets by mouth daily as needed.       Marland Kitchen amiodarone (PACERONE) 200 MG tablet        . aspirin 81 MG tablet Take 81 mg by mouth daily.        . colchicine 0.6 MG tablet Take 1 tablet (0.6 mg total) by mouth 2 (two) times daily as needed.  60 tablet  1  . diphenhydrAMINE (BENADRYL) 25 MG tablet Take 25 mg by mouth every 6 (six) hours as needed.        . docusate sodium (COLACE) 100 MG capsule Take 100 mg by mouth 2 (two) times daily as needed.        . furosemide (LASIX) 40 MG tablet Take 1 tablet (40 mg total) by mouth daily.  30 tablet  10  . HYDROcodone-acetaminophen (VICODIN) 5-500 MG per tablet As needed      . lansoprazole (PREVACID SOLUTAB) 30 MG disintegrating tablet Take 1 tablet (30 mg total) by mouth 2 (two) times daily.  60 tablet  6  . levalbuterol (XOPENEX HFA) 45 MCG/ACT inhaler Inhale 1-2 puffs into the lungs  every 6 (six) hours as needed.        . meloxicam (MOBIC) 7.5 MG tablet TAKE 1 TABLET BY MOUTH ONCE A DAY AS NEEDED  90 tablet  1  . metoprolol (LOPRESSOR) 50 MG tablet Take 1 tablet (50 mg total) by mouth 2 (two) times daily.  60 tablet  11  . senna (SENOKOT) 8.6 MG tablet Take 1 tablet by mouth daily as needed.        . sertraline (ZOLOFT) 25 MG tablet Take 1 tablet (25 mg total) by mouth daily.  30 tablet  6  . SYMBICORT 160-4.5 MCG/ACT inhaler INHALE 2 PUFFS TWICE DAILY. RINSE MOUTH WELL AFTER USE  1 Inhaler  2  . warfarin (COUMADIN) 2.5 MG tablet Take by mouth as directed.          Allergies  Allergen Reactions  . Penicillins     Review of Systems negative except from HPI and PMH  Physical Exam Well developed and well nourished wearing O2 in no acute distress HENT normal E scleral and icterus clear Neck Supple JVP flat; carotids brisk and full Clear to ausculation Regular rate and  rhythm, no murmurs gallops or rub Soft with active bowel sounds No clubbing cyanosis and edema Alert and oriented, grossly normal motor and sensory function Skin Warm and Dry  Assessment and  Plan

## 2010-11-25 NOTE — Assessment & Plan Note (Signed)
As above  We will see f we can make a difference

## 2010-12-20 ENCOUNTER — Other Ambulatory Visit: Payer: Self-pay | Admitting: *Deleted

## 2010-12-20 MED ORDER — BUDESONIDE-FORMOTEROL FUMARATE 160-4.5 MCG/ACT IN AERO: 2.0000 | INHALATION_SPRAY | Freq: Two times a day (BID) | RESPIRATORY_TRACT | Status: DC

## 2010-12-22 ENCOUNTER — Other Ambulatory Visit: Payer: Self-pay | Admitting: Pharmacist

## 2010-12-22 MED ORDER — WARFARIN SODIUM 2.5 MG PO TABS: ORAL_TABLET | ORAL | Status: DC

## 2010-12-24 ENCOUNTER — Ambulatory Visit (INDEPENDENT_AMBULATORY_CARE_PROVIDER_SITE_OTHER): Payer: Medicare HMO | Admitting: *Deleted

## 2010-12-24 DIAGNOSIS — I4891 Unspecified atrial fibrillation: Secondary | ICD-10-CM

## 2010-12-24 DIAGNOSIS — Z7901 Long term (current) use of anticoagulants: Secondary | ICD-10-CM

## 2011-01-13 ENCOUNTER — Telehealth: Payer: Self-pay | Admitting: Cardiovascular Disease

## 2011-01-13 NOTE — Telephone Encounter (Signed)
Pt wife calling wanting to speak w/ nurse about getting MD to fill out veterans pension and medical report and MD signature has to be on the paper work. Please return pt wife call to discuss further.

## 2011-01-13 NOTE — Telephone Encounter (Signed)
Spoke with pt wife, she will bring the paperwork by on Monday Adam [name]

## 2011-01-21 ENCOUNTER — Encounter: Payer: Medicare HMO | Admitting: *Deleted

## 2011-01-21 ENCOUNTER — Ambulatory Visit (INDEPENDENT_AMBULATORY_CARE_PROVIDER_SITE_OTHER): Payer: Medicare HMO | Admitting: *Deleted

## 2011-01-21 DIAGNOSIS — I4891 Unspecified atrial fibrillation: Secondary | ICD-10-CM

## 2011-01-21 DIAGNOSIS — Z7901 Long term (current) use of anticoagulants: Secondary | ICD-10-CM

## 2011-01-24 ENCOUNTER — Encounter: Payer: Self-pay | Admitting: Family Medicine

## 2011-01-24 ENCOUNTER — Ambulatory Visit (INDEPENDENT_AMBULATORY_CARE_PROVIDER_SITE_OTHER): Payer: Medicare HMO | Admitting: Family Medicine

## 2011-01-24 DIAGNOSIS — R0602 Shortness of breath: Secondary | ICD-10-CM

## 2011-01-24 NOTE — Progress Notes (Signed)
Administrative visit.  Needs form filled for [employer] benefits.  See scanned forms.    SOB, multifactorial. H/o COPD, lung disease with lobectomy prev done in the 1990s.  O2 dependent, gets out of breath easily.  He can walk zero blocks w/o getting SOB.  Heart disease with valve and CABG done 1/11.  "I left my energy with that surgery at [facility]."  He is occ dizzy, esp with position changes.    He is a fall risk, with prev falls noted.  H/o OA with painful rom of the shoulders.  He has some weakness and loss of muscle mass in the BLE.    His ability to provide self care has diminished as he has aged.  He doesn't drive.  Due to fall risk, he would need another person to be present for safety.    Meds, vitals, and allergies reviewed.   ROS: See HPI.  Otherwise, noncontributory.  nad On O2 ncat Mmm rrr ctab Weakness due to pain in the shoulders- I can easily overcome his resistance at the shoulders and elbows B Weakness of hip flexors noted B.   He gets out of breath easily with strength testing.

## 2011-01-24 NOTE — Patient Instructions (Signed)
Take care and let me know if I can help.  Glad to see you.

## 2011-01-24 NOTE — Assessment & Plan Note (Signed)
Multifactorial, with CAD, valve replacement, COPD, prior lobectomy.  Also with OA and relative weakness of the extremities with h/o falls.  I filled out his form to reflect his current condition. >25 min spent with face to face with patient, >50% counseling and/or coordinating care.

## 2011-02-11 ENCOUNTER — Ambulatory Visit (INDEPENDENT_AMBULATORY_CARE_PROVIDER_SITE_OTHER): Payer: Medicare HMO | Admitting: *Deleted

## 2011-02-11 DIAGNOSIS — Z7901 Long term (current) use of anticoagulants: Secondary | ICD-10-CM

## 2011-02-11 DIAGNOSIS — I4891 Unspecified atrial fibrillation: Secondary | ICD-10-CM

## 2011-02-11 LAB — POCT INR: INR: 3

## 2011-03-11 ENCOUNTER — Ambulatory Visit (INDEPENDENT_AMBULATORY_CARE_PROVIDER_SITE_OTHER): Payer: Medicare HMO | Admitting: *Deleted

## 2011-03-11 DIAGNOSIS — Z7901 Long term (current) use of anticoagulants: Secondary | ICD-10-CM

## 2011-03-11 DIAGNOSIS — I4891 Unspecified atrial fibrillation: Secondary | ICD-10-CM

## 2011-03-11 LAB — POCT INR: INR: 2.8

## 2011-04-06 ENCOUNTER — Other Ambulatory Visit: Payer: Self-pay | Admitting: *Deleted

## 2011-04-06 MED ORDER — LANSOPRAZOLE 30 MG PO CPDR: 30.0000 mg | DELAYED_RELEASE_CAPSULE | Freq: Two times a day (BID) | ORAL | Status: DC

## 2011-04-06 NOTE — Telephone Encounter (Signed)
Received refill request from pharmacy for Lansoprazole 30 mg #60, take one capsule by mouth 2 times daily. This does not match med sheet. Spoke to Rob at [facility] and was advised that patient has been taking the capsule and not the Prevacid Solutab that is listed on the med sheet. Please confirm correct medication refill.

## 2011-04-06 NOTE — Telephone Encounter (Signed)
Will refill electronically  

## 2011-04-08 ENCOUNTER — Encounter: Payer: Medicare HMO | Admitting: *Deleted

## 2011-04-13 ENCOUNTER — Ambulatory Visit (INDEPENDENT_AMBULATORY_CARE_PROVIDER_SITE_OTHER): Payer: Medicare HMO | Admitting: *Deleted

## 2011-04-13 ENCOUNTER — Ambulatory Visit (INDEPENDENT_AMBULATORY_CARE_PROVIDER_SITE_OTHER): Payer: Medicare HMO | Admitting: Cardiovascular Disease

## 2011-04-13 ENCOUNTER — Encounter: Payer: Self-pay | Admitting: Cardiovascular Disease

## 2011-04-13 VITALS — BP 98/50 | HR 70 | Ht 71.5 in | Wt 190.0 lb

## 2011-04-13 DIAGNOSIS — I872 Venous insufficiency (chronic) (peripheral): Secondary | ICD-10-CM

## 2011-04-13 DIAGNOSIS — E785 Hyperlipidemia, unspecified: Secondary | ICD-10-CM

## 2011-04-13 DIAGNOSIS — I2510 Atherosclerotic heart disease of native coronary artery without angina pectoris: Secondary | ICD-10-CM

## 2011-04-13 DIAGNOSIS — I10 Essential (primary) hypertension: Secondary | ICD-10-CM

## 2011-04-13 DIAGNOSIS — J8410 Pulmonary fibrosis, unspecified: Secondary | ICD-10-CM

## 2011-04-13 DIAGNOSIS — Z7901 Long term (current) use of anticoagulants: Secondary | ICD-10-CM

## 2011-04-13 DIAGNOSIS — Z950 Presence of cardiac pacemaker: Secondary | ICD-10-CM

## 2011-04-13 DIAGNOSIS — I4891 Unspecified atrial fibrillation: Secondary | ICD-10-CM

## 2011-04-13 DIAGNOSIS — I359 Nonrheumatic aortic valve disorder, unspecified: Secondary | ICD-10-CM

## 2011-04-13 LAB — POCT INR: INR: 3.7

## 2011-04-13 NOTE — Assessment & Plan Note (Signed)
Cholesterol is at goal.  Continue current dose of statin and diet Rx.  No myalgias or side effects.  F/U  LFT's in 6 months. Lab Results  Component Value Date   LDLCALC 48 04/09/2010             

## 2011-04-13 NOTE — Assessment & Plan Note (Signed)
Well controlled.  Continue current medications and low sodium Dash type diet.    

## 2011-04-13 NOTE — Patient Instructions (Addendum)
Your physician wants you to follow-up in: 6 MONTHS WITH DR Eden Emms  AND FEB WITH KRISTIN AND PAULA  You will receive a reminder letter in the mail two months in advance. If you don't receive a letter, please call our office to schedule the follow-up appointment. Your physician recommends that you continue on your current medications as directed. Please refer to the Current Medication list given to you today.

## 2011-04-13 NOTE — Assessment & Plan Note (Signed)
Maint NSR post DCC 

## 2011-04-13 NOTE — Progress Notes (Signed)
Patient ID: Adam [name], male   DOB: [birth year], 78 y.o.   MRN: [id number] Adam [name] is an 78 yo male with afib, AVR, CAD. He was cardioverted him in June and he is maintaining NSR. He has a history of diastolic dysfunction and intolerance to afib. He has good LV function and a normal functioning AVR. He has been on an adequate dose of Lasix for mildly elevated BNP with higher doses causing prerenal azotemia. His CXR shows diffuse interstitial lung disease and he had pre-existing COPD before his CABG/AVR at [facility] in March. He finished cardiac rehab at [facility] and he may be a candidate for pulmonary rehab. He called in recently with a cold right leg a few nights ago. No pain. No ulcers. He denies claudication. He does have severe varicosities, esp. in his right leg. He used to wear compression hose. SK follows his pacer  [facility] in [place] for assisted living but he and his wife hated it  ROS: Denies fever, malais, weight loss, blurry vision, decreased visual acuity, cough, sputum, SOB, hemoptysis, pleuritic pain, palpitaitons, heartburn, abdominal pain, melena, lower extremity edema, claudication, or rash.  All other systems reviewed and negative  General: Affect appropriate Healthy:  appears stated age HEENT: normal Neck supple with no adenopathy JVP normal no bruits no thyromegaly Lungs clear with no wheezing and good diaphragmatic motion Heart:  S1/S2 systolic  murmur,rub, gallop or click PMI normal Abdomen: benighn, BS positve, no tenderness, no AAA no bruit.  No HSM or HJR Distal pulses intact with no bruits No edema Neuro non-focal Skin warm and dry No muscular weakness   Current Outpatient Prescriptions  Medication Sig Dispense Refill  . acetaminophen (TYLENOL) 650 MG CR tablet Take 3 tablets by mouth daily as needed.       Marland Kitchen amiodarone (PACERONE) 200 MG tablet Take 200 mg by mouth daily.       Marland Kitchen aspirin 81 MG tablet Take 81 mg by mouth daily.        .  budesonide-formoterol (SYMBICORT) 160-4.5 MCG/ACT inhaler Inhale 2 puffs into the lungs 2 (two) times daily.  1 Inhaler  0  . colchicine 0.6 MG tablet Take 1 tablet (0.6 mg total) by mouth 2 (two) times daily as needed.  60 tablet  1  . diphenhydrAMINE (BENADRYL) 25 MG tablet Take 25 mg by mouth every 6 (six) hours as needed.        . docusate sodium (COLACE) 100 MG capsule Take 100 mg by mouth 2 (two) times daily as needed.        . furosemide (LASIX) 40 MG tablet Take 1 tablet (40 mg total) by mouth daily.  30 tablet  10  . HYDROcodone-acetaminophen (VICODIN) 5-500 MG per tablet As needed      . lansoprazole (PREVACID) 30 MG capsule Take 1 capsule (30 mg total) by mouth 2 (two) times daily.  60 capsule  11  . levalbuterol (XOPENEX HFA) 45 MCG/ACT inhaler Inhale 1-2 puffs into the lungs every 6 (six) hours as needed.        . meloxicam (MOBIC) 7.5 MG tablet TAKE 1 TABLET BY MOUTH ONCE A DAY AS NEEDED  90 tablet  1  . metoprolol (LOPRESSOR) 50 MG tablet Take 1 tablet (50 mg total) by mouth 2 (two) times daily.  60 tablet  11  . senna (SENOKOT) 8.6 MG tablet Take 1 tablet by mouth daily as needed.        . sertraline (ZOLOFT) 25 MG  tablet Take 1 tablet (25 mg total) by mouth daily.  30 tablet  6  . warfarin (COUMADIN) 2.5 MG tablet Take as directed by Anticoagulation clinic.  Pt takes up to 1 1/2 tablets daily  45 tablet  3    Allergies  Penicillins  Electrocardiogram:  Assessment and Plan

## 2011-04-13 NOTE — Assessment & Plan Note (Signed)
Stable with no angina and good activity level.  Continue medical Rx  

## 2011-04-13 NOTE — Assessment & Plan Note (Signed)
CHB post AVR at [facility].  Pacer check in February

## 2011-04-13 NOTE — Assessment & Plan Note (Signed)
Large varicosities in LE chronic  Low sodium compression hose.

## 2011-04-13 NOTE — Assessment & Plan Note (Signed)
Stable.  Oxygen as needed  Primary limitation post AVR  S/P R lobectomy for CA with restrictive lung disease

## 2011-04-13 NOTE — Assessment & Plan Note (Signed)
S/P AVR no change in murmur Normal gradient by F/U echo.  SBE prophylaxis

## 2011-04-19 ENCOUNTER — Other Ambulatory Visit: Payer: Self-pay

## 2011-04-19 ENCOUNTER — Ambulatory Visit (INDEPENDENT_AMBULATORY_CARE_PROVIDER_SITE_OTHER): Payer: Medicare HMO | Admitting: Family Medicine

## 2011-04-19 ENCOUNTER — Inpatient Hospital Stay (HOSPITAL_COMMUNITY)
Admission: EM | Admit: 2011-04-19 | Discharge: 2011-04-29 | DRG: 189 | Disposition: A | Discharge status: Home Health | Payer: Medicare HMO | Attending: Internal Medicine | Admitting: Internal Medicine

## 2011-04-19 ENCOUNTER — Ambulatory Visit (INDEPENDENT_AMBULATORY_CARE_PROVIDER_SITE_OTHER)
Admission: RE | Admit: 2011-04-19 | Discharge: 2011-04-19 | Disposition: A | Discharge status: Home / Self Care | Payer: Medicare HMO | Source: Ambulatory Visit | Attending: Family Medicine | Admitting: Family Medicine

## 2011-04-19 ENCOUNTER — Encounter (HOSPITAL_COMMUNITY): Payer: Self-pay | Admitting: *Deleted

## 2011-04-19 ENCOUNTER — Encounter: Payer: Self-pay | Admitting: Family Medicine

## 2011-04-19 VITALS — BP 138/78 | HR 76 | Temp 99.5°F | Wt 189.5 lb

## 2011-04-19 DIAGNOSIS — J449 Chronic obstructive pulmonary disease, unspecified: Secondary | ICD-10-CM | POA: Diagnosis present

## 2011-04-19 DIAGNOSIS — F528 Other sexual dysfunction not due to a substance or known physiological condition: Secondary | ICD-10-CM

## 2011-04-19 DIAGNOSIS — I2510 Atherosclerotic heart disease of native coronary artery without angina pectoris: Secondary | ICD-10-CM | POA: Diagnosis present

## 2011-04-19 DIAGNOSIS — R5381 Other malaise: Secondary | ICD-10-CM | POA: Diagnosis present

## 2011-04-19 DIAGNOSIS — I4949 Other premature depolarization: Secondary | ICD-10-CM

## 2011-04-19 DIAGNOSIS — M1990 Unspecified osteoarthritis, unspecified site: Secondary | ICD-10-CM | POA: Diagnosis present

## 2011-04-19 DIAGNOSIS — Z9981 Dependence on supplemental oxygen: Secondary | ICD-10-CM

## 2011-04-19 DIAGNOSIS — N184 Chronic kidney disease, stage 4 (severe): Secondary | ICD-10-CM | POA: Diagnosis present

## 2011-04-19 DIAGNOSIS — R55 Syncope and collapse: Secondary | ICD-10-CM

## 2011-04-19 DIAGNOSIS — J189 Pneumonia, unspecified organism: Secondary | ICD-10-CM | POA: Diagnosis present

## 2011-04-19 DIAGNOSIS — R05 Cough: Secondary | ICD-10-CM

## 2011-04-19 DIAGNOSIS — K222 Esophageal obstruction: Secondary | ICD-10-CM

## 2011-04-19 DIAGNOSIS — R0602 Shortness of breath: Secondary | ICD-10-CM

## 2011-04-19 DIAGNOSIS — I10 Essential (primary) hypertension: Secondary | ICD-10-CM | POA: Diagnosis present

## 2011-04-19 DIAGNOSIS — N400 Enlarged prostate without lower urinary tract symptoms: Secondary | ICD-10-CM | POA: Diagnosis present

## 2011-04-19 DIAGNOSIS — K219 Gastro-esophageal reflux disease without esophagitis: Secondary | ICD-10-CM

## 2011-04-19 DIAGNOSIS — J8410 Pulmonary fibrosis, unspecified: Secondary | ICD-10-CM

## 2011-04-19 DIAGNOSIS — J9620 Acute and chronic respiratory failure, unspecified whether with hypoxia or hypercapnia: Secondary | ICD-10-CM | POA: Diagnosis present

## 2011-04-19 DIAGNOSIS — D72829 Elevated white blood cell count, unspecified: Secondary | ICD-10-CM | POA: Diagnosis present

## 2011-04-19 DIAGNOSIS — Z951 Presence of aortocoronary bypass graft: Secondary | ICD-10-CM

## 2011-04-19 DIAGNOSIS — I872 Venous insufficiency (chronic) (peripheral): Secondary | ICD-10-CM

## 2011-04-19 DIAGNOSIS — Z902 Acquired absence of lung [part of]: Secondary | ICD-10-CM

## 2011-04-19 DIAGNOSIS — K5900 Constipation, unspecified: Secondary | ICD-10-CM

## 2011-04-19 DIAGNOSIS — J4489 Other specified chronic obstructive pulmonary disease: Secondary | ICD-10-CM | POA: Diagnosis present

## 2011-04-19 DIAGNOSIS — J309 Allergic rhinitis, unspecified: Secondary | ICD-10-CM

## 2011-04-19 DIAGNOSIS — T7840XA Allergy, unspecified, initial encounter: Secondary | ICD-10-CM

## 2011-04-19 DIAGNOSIS — K5730 Diverticulosis of large intestine without perforation or abscess without bleeding: Secondary | ICD-10-CM

## 2011-04-19 DIAGNOSIS — R5383 Other fatigue: Secondary | ICD-10-CM | POA: Diagnosis present

## 2011-04-19 DIAGNOSIS — Z950 Presence of cardiac pacemaker: Secondary | ICD-10-CM | POA: Diagnosis present

## 2011-04-19 DIAGNOSIS — M549 Dorsalgia, unspecified: Secondary | ICD-10-CM

## 2011-04-19 DIAGNOSIS — Z87891 Personal history of nicotine dependence: Secondary | ICD-10-CM

## 2011-04-19 DIAGNOSIS — K449 Diaphragmatic hernia without obstruction or gangrene: Secondary | ICD-10-CM

## 2011-04-19 DIAGNOSIS — I359 Nonrheumatic aortic valve disorder, unspecified: Secondary | ICD-10-CM | POA: Diagnosis present

## 2011-04-19 DIAGNOSIS — M129 Arthropathy, unspecified: Secondary | ICD-10-CM | POA: Diagnosis present

## 2011-04-19 DIAGNOSIS — J209 Acute bronchitis, unspecified: Secondary | ICD-10-CM

## 2011-04-19 DIAGNOSIS — I4891 Unspecified atrial fibrillation: Secondary | ICD-10-CM | POA: Diagnosis present

## 2011-04-19 DIAGNOSIS — K644 Residual hemorrhoidal skin tags: Secondary | ICD-10-CM

## 2011-04-19 DIAGNOSIS — E785 Hyperlipidemia, unspecified: Secondary | ICD-10-CM | POA: Diagnosis present

## 2011-04-19 DIAGNOSIS — I129 Hypertensive chronic kidney disease with stage 1 through stage 4 chronic kidney disease, or unspecified chronic kidney disease: Secondary | ICD-10-CM | POA: Diagnosis present

## 2011-04-19 DIAGNOSIS — I442 Atrioventricular block, complete: Secondary | ICD-10-CM

## 2011-04-19 DIAGNOSIS — Z85118 Personal history of other malignant neoplasm of bronchus and lung: Secondary | ICD-10-CM | POA: Diagnosis present

## 2011-04-19 DIAGNOSIS — Z7901 Long term (current) use of anticoagulants: Secondary | ICD-10-CM | POA: Diagnosis present

## 2011-04-19 DIAGNOSIS — Z66 Do not resuscitate: Secondary | ICD-10-CM

## 2011-04-19 DIAGNOSIS — Z954 Presence of other heart-valve replacement: Secondary | ICD-10-CM

## 2011-04-19 DIAGNOSIS — Z87898 Personal history of other specified conditions: Secondary | ICD-10-CM | POA: Diagnosis present

## 2011-04-19 LAB — CBC
HCT: 40.8 % (ref 39.0–52.0)
MCH: 30.2 pg (ref 26.0–34.0)
MCHC: 32.6 g/dL (ref 30.0–36.0)
MCV: 92.7 fL (ref 78.0–100.0)
RDW: 16.3 % — ABNORMAL HIGH (ref 11.5–15.5)

## 2011-04-19 LAB — DIFFERENTIAL
Basophils Absolute: 0 10*3/uL (ref 0.0–0.1)
Basophils Relative: 0 % (ref 0–1)
Eosinophils Relative: 0 % (ref 0–5)
Lymphocytes Relative: 6 % — ABNORMAL LOW (ref 12–46)
Monocytes Absolute: 1.3 10*3/uL — ABNORMAL HIGH (ref 0.1–1.0)

## 2011-04-19 LAB — BASIC METABOLIC PANEL
CO2: 24 mEq/L (ref 19–32)
Calcium: 9.2 mg/dL (ref 8.4–10.5)
Creatinine, Ser: 1.88 mg/dL — ABNORMAL HIGH (ref 0.50–1.35)

## 2011-04-19 LAB — CARDIAC PANEL(CRET KIN+CKTOT+MB+TROPI): Troponin I: <0.3 ng/mL (ref <0.30)

## 2011-04-19 LAB — PRO B NATRIURETIC PEPTIDE: Pro B Natriuretic peptide (BNP): 7997 pg/mL — ABNORMAL HIGH (ref 0–450)

## 2011-04-19 LAB — POCT I-STAT TROPONIN I

## 2011-04-19 MED ORDER — ZOLPIDEM TARTRATE 5 MG PO TABS
5.0000 mg | ORAL_TABLET | Freq: Every evening | ORAL | Status: DC | PRN
  Administered 2011-04-19 – 2011-04-23 (×3): 5 mg via ORAL
  Filled 2011-04-19 (×3): qty 1

## 2011-04-19 MED ORDER — IPRATROPIUM BROMIDE 0.02 % IN SOLN
0.5000 mg | Freq: Once | RESPIRATORY_TRACT | Status: AC
  Administered 2011-04-19: 0.5 mg via RESPIRATORY_TRACT
  Filled 2011-04-19: qty 2.5

## 2011-04-19 MED ORDER — LEVALBUTEROL TARTRATE 45 MCG/ACT IN AERO
1.0000 | INHALATION_SPRAY | Freq: Four times a day (QID) | RESPIRATORY_TRACT | Status: DC | PRN
  Filled 2011-04-19: qty 15

## 2011-04-19 MED ORDER — METOPROLOL TARTRATE 50 MG PO TABS
50.0000 mg | ORAL_TABLET | Freq: Two times a day (BID) | ORAL | Status: DC
  Administered 2011-04-19 – 2011-04-29 (×17): 50 mg via ORAL
  Filled 2011-04-19 (×21): qty 1

## 2011-04-19 MED ORDER — SODIUM CHLORIDE 0.9 % IJ SOLN
3.0000 mL | Freq: Two times a day (BID) | INTRAMUSCULAR | Status: DC
  Administered 2011-04-19 – 2011-04-29 (×19): 3 mL via INTRAVENOUS

## 2011-04-19 MED ORDER — SERTRALINE HCL 25 MG PO TABS
12.5000 mg | ORAL_TABLET | Freq: Every day | ORAL | Status: DC
  Administered 2011-04-19 – 2011-04-29 (×11): 12.5 mg via ORAL
  Filled 2011-04-19 (×11): qty 0.5

## 2011-04-19 MED ORDER — DEXTROSE 5 % IV SOLN: 1.0000 g | INTRAVENOUS | Status: DC

## 2011-04-19 MED ORDER — DEXTROSE 5 % IV SOLN: 500.0000 mg | INTRAVENOUS | Status: DC

## 2011-04-19 MED ORDER — SODIUM CHLORIDE 0.9 % IV SOLN
Freq: Once | INTRAVENOUS | Status: AC
  Administered 2011-04-19: 15:00:00 via INTRAVENOUS

## 2011-04-19 MED ORDER — AMIODARONE HCL 200 MG PO TABS
200.0000 mg | ORAL_TABLET | Freq: Every day | ORAL | Status: DC
  Administered 2011-04-19 – 2011-04-27 (×9): 200 mg via ORAL
  Filled 2011-04-19 (×9): qty 1

## 2011-04-19 MED ORDER — DEXTROSE 5 % IV SOLN
1.0000 g | Freq: Once | INTRAVENOUS | Status: AC
  Administered 2011-04-19: 1 g via INTRAVENOUS
  Filled 2011-04-19: qty 10

## 2011-04-19 MED ORDER — AZITHROMYCIN 500 MG IV SOLR
500.0000 mg | Freq: Once | INTRAVENOUS | Status: AC
  Administered 2011-04-19: 500 mg via INTRAVENOUS
  Filled 2011-04-19: qty 500

## 2011-04-19 MED ORDER — DIPHENHYDRAMINE HCL 25 MG PO TABS
25.0000 mg | ORAL_TABLET | Freq: Four times a day (QID) | ORAL | Status: DC | PRN
Start: 2011-04-19 — End: 2011-04-29
  Filled 2011-04-19: qty 1

## 2011-04-19 MED ORDER — DEXTROSE 5 % IV SOLN
500.0000 mg | INTRAVENOUS | Status: AC
  Administered 2011-04-20 – 2011-04-22 (×3): 500 mg via INTRAVENOUS
  Filled 2011-04-19 (×3): qty 500

## 2011-04-19 MED ORDER — ALBUTEROL SULFATE (5 MG/ML) 0.5% IN NEBU
2.5000 mg | INHALATION_SOLUTION | Freq: Once | RESPIRATORY_TRACT | Status: AC
  Administered 2011-04-19: 2.5 mg via RESPIRATORY_TRACT
  Filled 2011-04-19: qty 0.5

## 2011-04-19 MED ORDER — COLCHICINE 0.6 MG PO TABS
0.6000 mg | ORAL_TABLET | Freq: Two times a day (BID) | ORAL | Status: DC | PRN
  Filled 2011-04-19: qty 1

## 2011-04-19 MED ORDER — MELOXICAM 7.5 MG PO TABS
7.5000 mg | ORAL_TABLET | Freq: Every day | ORAL | Status: DC | PRN
  Filled 2011-04-19: qty 1

## 2011-04-19 MED ORDER — BUDESONIDE-FORMOTEROL FUMARATE 160-4.5 MCG/ACT IN AERO
2.0000 | INHALATION_SPRAY | Freq: Two times a day (BID) | RESPIRATORY_TRACT | Status: DC
  Administered 2011-04-20 – 2011-04-29 (×18): 2 via RESPIRATORY_TRACT
  Filled 2011-04-19: qty 6

## 2011-04-19 MED ORDER — ASPIRIN 81 MG PO CHEW
81.0000 mg | CHEWABLE_TABLET | Freq: Every day | ORAL | Status: DC
  Administered 2011-04-19 – 2011-04-29 (×11): 81 mg via ORAL
  Filled 2011-04-19 (×11): qty 1

## 2011-04-19 MED ORDER — SENNA 8.6 MG PO TABS: 1.0000 | ORAL_TABLET | Freq: Every day | ORAL | Status: DC | PRN

## 2011-04-19 MED ORDER — DOCUSATE SODIUM 100 MG PO CAPS
100.0000 mg | ORAL_CAPSULE | Freq: Two times a day (BID) | ORAL | Status: DC | PRN
  Filled 2011-04-19: qty 1

## 2011-04-19 MED ORDER — PANTOPRAZOLE SODIUM 40 MG PO TBEC
40.0000 mg | DELAYED_RELEASE_TABLET | Freq: Every day | ORAL | Status: DC
  Administered 2011-04-19 – 2011-04-29 (×11): 40 mg via ORAL
  Filled 2011-04-19 (×11): qty 1

## 2011-04-19 MED ORDER — DEXTROSE 5 % IV SOLN
1.0000 g | INTRAVENOUS | Status: DC
  Administered 2011-04-20 – 2011-04-23 (×4): 1 g via INTRAVENOUS
  Filled 2011-04-19 (×4): qty 10

## 2011-04-19 MED ORDER — FUROSEMIDE 40 MG PO TABS
40.0000 mg | ORAL_TABLET | Freq: Every day | ORAL | Status: DC
  Administered 2011-04-19 – 2011-04-27 (×8): 40 mg via ORAL
  Filled 2011-04-19 (×10): qty 1

## 2011-04-19 MED ORDER — SODIUM CHLORIDE 0.9 % IV SOLN: 250.0000 mL | INTRAVENOUS | Status: DC | PRN

## 2011-04-19 MED ORDER — SODIUM CHLORIDE 0.9 % IJ SOLN: 3.0000 mL | INTRAMUSCULAR | Status: DC | PRN

## 2011-04-19 NOTE — ED Notes (Signed)
Pt sent from [facility] for eval of possible RUL PNA. C/o congestion, productive cough, sob at rest. Wears 2L O2 [place] at home. Sats 88-90% in office. In ED, low 80's placed on 3L, up to 95%.

## 2011-04-19 NOTE — ED Provider Notes (Addendum)
Sign-out accepted.  BNP elevated to 7000s today.  ECG and TNI were ordered once this returned.     Date: 04/19/2011  Rate: 70  Rhythm: normal sinus rhythm  QRS Axis: left  Intervals: normal  ST/T Wave abnormalities: nonspecific T wave changes  Conduction Disutrbances:left bundle branch block  Narrative Interpretation: new T wave inversions in the inferior and lateral leads.  Old EKG Reviewed: changes noted   Troponin returned normal. Patient continued to deny any chest pain. Exam was not consistent with heart failure exacerbation. Call was placed the hospitalist for admission.  Patient was admitted for RUL pneumonia with increased O2 requirement.  Hospitalist made aware of T wave changes and patient submitted for tele bed.   Cyndra Numbers, MD 04/19/11 Leta Baptist, MD 04/19/11 [phone]

## 2011-04-19 NOTE — Progress Notes (Signed)
ANTICOAGULATION CONSULT NOTE - Initial Consult  Pharmacy Consult for Coumadin Indication: atrial fibrillation  Allergies  Allergen Reactions  . Penicillins     Patient Measurements: Height: 5\' 11"  (180.3 cm) Weight: 186 lb 15.2 oz (84.8 kg) IBW/kg (Calculated) : 75.3     Vital Signs: Temp: 99 F (37.2 C) (01/15 2204) Temp src: Oral (01/15 2204) BP: 131/62 mmHg (01/15 2204) Pulse Rate: 70  (01/15 2204)  Labs:  Basename 04/19/11 1940 04/19/11 1540  HGB -- 13.3  HCT -- 40.8  PLT -- 185  APTT -- --  LABPROT -- 28.2*  INR -- 2.59*  HEPARINUNFRC -- --  CREATININE -- 1.88*  CKTOTAL 48 --  CKMB 2.4 --  TROPONINI <0.30 --   Estimated Creatinine Clearance: 28.9 ml/min (by C-G formula based on Cr of 1.88).  Medical History: Past Medical History  Diagnosis Date  . Cancer     Lung  . COPD (chronic obstructive pulmonary disease)   . Diverticulosis of colon   . GERD (gastroesophageal reflux disease)     Stricture  . Hyperlipidemia   . Hypertension   . Esophageal dysmotility   . Osteoarthritis   . Aortic stenosis     s/p AVR CABG  . Mild renal insufficiency   . Erectile dysfunction   . Venous insufficiency   . Gout   . BPH (benign prostatic hypertrophy)   . Atrioventricular block, complete     cx cabg/AVR  . Pacemaker     [employer] jud[employer]    Medications:  Scheduled:    . sodium chloride   Intravenous Once  . albuterol  2.5 mg Nebulization Once  . amiodarone  200 mg Oral Daily  . aspirin  81 mg Oral Daily  . azithromycin (ZITHROMAX) 500 MG IVPB  500 mg Intravenous Once  . azithromycin  500 mg Intravenous Q24H  . budesonide-formoterol  2 puff Inhalation BID  . cefTRIAXone (ROCEPHIN)  IV  1 g Intravenous Once  . cefTRIAXone (ROCEPHIN)  IV  1 g Intravenous Q24H  . furosemide  40 mg Oral Daily  . ipratropium  0.5 mg Nebulization Once  . metoprolol  50 mg Oral BID  . pantoprazole  40 mg Oral Daily  . sertraline  12.5 mg Oral Daily  . sodium chloride  3 mL  Intravenous Q12H  . DISCONTD: azithromycin  500 mg Intravenous Q24H  . DISCONTD: cefTRIAXone (ROCEPHIN)  IV  1 g Intravenous Q24H   Infusions:    Assessment: 78 year old male on Coumadin PTA for atrial fibrillation.  Patient also with history of CABG with AVR.  Coumadin home regimen = 3.75mg  daily except for 2.5mg  on Thursdays.  Per patient, took Coumadin dose today.  INR upon arrival at hospital therapeutic (INR = 2.59).  Goal of Therapy:  INR 2-3   Plan:  1.  No additional coumadin this evening (patient already taken dose for 04/19/11) 2.  Check daily PT/INR  Maryellen Pile 04/19/2011,10:09 PM

## 2011-04-19 NOTE — Patient Instructions (Addendum)
Given significant coughing and worsening shortness of breath, I do think we should have you evaluated at the hospital. I have called to let them know you're coming.

## 2011-04-19 NOTE — ED Notes (Signed)
Attempted to call report, was told nurse is unavailable, told they'll call back.

## 2011-04-19 NOTE — Assessment & Plan Note (Addendum)
Significant productive cough with SOB and chest tightness, fever, frail elderly. CXR with possible RUL infiltrate.  Alternatively severe bronchitis vs COPD exac although pt denies h/o COPD. However also endorsing chest tightness in known CAD. Likely very low pulm and cardiac reserve. Discussed options. Family prefers ER evaluation today given rapid deterioration and continued SOB. Will send to ER for further evaluation.

## 2011-04-19 NOTE — Progress Notes (Signed)
Subjective:    Patient ID: Adam [name], male    DOB: 01-Jan-1921, 78 y.o.   MRN: [id number]  HPI CC: SOB, cough, congestion  Complicated 78yo new to me with h/o afib, AVR, COPD, CAD s/p CABG 2011, lobectomy 1991 for lung cancer, never fully regained lung capacity.  Uses home O2 via 2L [place].  In office O2 sat 88-90% on 2L[place].  Does not know bseline O2 at home although last visit here had pulse ox 98% on 2L.  Longstanding SOB.   Sxs started 2-3 days ago.  Endorsing significant phlegm, congestion in chest.  Also with some chest tightness worse with coughing but does feel "like elephant sitting on my chest".  Spitting up phlegm with cough.  Unable to lay flat.  Better when sleeping on incline.  When sitting with o2 not SOB, but DOE with any minimal exhertion.  Saw Dr. Eden Emms on Friday and was doing fine.  Rapidly progressive deterioration.  Had fall yesterday (slipped on rocking chair).  Feeling SOB even now.  + fevers and chills.  No ST, HA, ear pain or tooth pain.  Flu shot this year.  Thinks has had pneumonia shot in past.  Wt Readings from Last 3 Encounters:  04/19/11 189 lb 8 oz (85.957 kg)  04/13/11 190 lb (86.183 kg)  01/24/11 188 lb (85.276 kg)   Medications and allergies reviewed and updated in chart.  Past histories reviewed and updated if relevant as below. Patient Active Problem List  Diagnoses  . HYPERLIPIDEMIA  . GOUTY ARTHROPATHY  . ERECTILE DYSFUNCTION  . HYPERTENSION  . CAD  . AORTIC STENOSIS  . ATRIAL FIBRILLATION, PAROXYSMAL  . PREMATURE VENTRICULAR CONTRACTIONS  . HEMORRHOIDS, EXTERNAL  . VENOUS INSUFFICIENCY  . ALLERGIC RHINITIS  . COPD  . INTERSTITIAL LUNG DISEASE  . ESOPHAGEAL STRICTURE  . GERD  . HIATAL HERNIA WITH REFLUX  . DIVERTICULOSIS, COLON  . CONSTIPATION  . RENAL INSUFFICIENCY  . OSTEOARTHRITIS  . ARTHRITIS  . SYNCOPE  . SHORTNESS OF BREATH  . ALLERGY  . LUNG CANCER, HX OF  . BENIGN PROSTATIC HYPERTROPHY, HX OF  . BRONCHITIS, ACUTE  . Long  term current use of anticoagulant  . Back pain  . Pacemaker  . Atrioventricular block, complete  . Cough   Past Medical History  Diagnosis Date  . Cancer     Lung  . COPD (chronic obstructive pulmonary disease)   . Diverticulosis of colon   . GERD (gastroesophageal reflux disease)     Stricture  . Hyperlipidemia   . Hypertension   . Esophageal dysmotility   . Osteoarthritis   . Aortic stenosis     s/p AVR CABG  . Mild renal insufficiency   . Erectile dysfunction   . Venous insufficiency   . Gout   . BPH (benign prostatic hypertrophy)   . Atrioventricular block, complete     cx cabg/AVR  . Pacemaker     st judes   Past Surgical History  Procedure Date  . Aortic valve replacement 05/04/09    with CABG x 3  . Pacemaker insertion 05/08/09    [employer], dual chamber  . Cholecystectomy   . Tonsillectomy   . Lobectomy 1991    Lung  . Prostate surgery 1990  . Thumb surgery     Left  . Varicose vein surgery   . Basal cell surgery     Nose  . Esophagogastroduodenoscopy 10/2005    Stricture  . Cardiolite 11/2003  Neg.  Mild to moderate AS  . Carotid US 11/2005    Neg  . Esophagogastroduodenoscopy 07/2004    Gastritis, stricture, dysmotility  . Rib films 08/2005    Right 10th rib fracture  . Doppler echocardiography 09/2005    Stable  . 2d echocardiogram 06/2007    Mod aortic stenosis with EF 55%  . Colonoscopy     diverticulosis, small polyp, hemorrhoids   History  Substance Use Topics  . Smoking status: Former Smoker    Types: Cigarettes    Quit date: 04/04/1989  . Smokeless tobacco: Not on file  . Alcohol Use: No   Family History  Problem Relation Age of Onset  . Cancer Mother     Pancreatic  . Heart disease Father     CAD  . Cancer Sister     ? abdominal CA  . Cancer Brother     Colon  . Heart disease Maternal Grandmother    Allergies  Allergen Reactions  . Penicillins    Current Outpatient Prescriptions on File Prior to Visit    Medication Sig Dispense Refill  . acetaminophen (TYLENOL) 650 MG CR tablet Take 3 tablets by mouth daily as needed.       Marland Kitchen amiodarone (PACERONE) 200 MG tablet Take 200 mg by mouth daily.       Marland Kitchen aspirin 81 MG tablet Take 81 mg by mouth daily.        . colchicine 0.6 MG tablet Take 1 tablet (0.6 mg total) by mouth 2 (two) times daily as needed.  60 tablet  1  . diphenhydrAMINE (BENADRYL) 25 MG tablet Take 25 mg by mouth every 6 (six) hours as needed.        . furosemide (LASIX) 40 MG tablet Take 1 tablet (40 mg total) by mouth daily.  30 tablet  10  . lansoprazole (PREVACID) 30 MG capsule Take 1 capsule (30 mg total) by mouth 2 (two) times daily.  60 capsule  11  . meloxicam (MOBIC) 7.5 MG tablet TAKE 1 TABLET BY MOUTH ONCE A DAY AS NEEDED  90 tablet  1  . metoprolol (LOPRESSOR) 50 MG tablet Take 1 tablet (50 mg total) by mouth 2 (two) times daily.  60 tablet  11  . sertraline (ZOLOFT) 25 MG tablet Take 1 tablet (25 mg total) by mouth daily.  30 tablet  6  . warfarin (COUMADIN) 2.5 MG tablet Take as directed by Anticoagulation clinic.  Pt takes up to 1 1/2 tablets daily  45 tablet  3  . budesonide-formoterol (SYMBICORT) 160-4.5 MCG/ACT inhaler Inhale 2 puffs into the lungs 2 (two) times daily.  1 Inhaler  0  . docusate sodium (COLACE) 100 MG capsule Take 100 mg by mouth 2 (two) times daily as needed.        Marland Kitchen HYDROcodone-acetaminophen (VICODIN) 5-500 MG per tablet As needed      . levalbuterol (XOPENEX HFA) 45 MCG/ACT inhaler Inhale 1-2 puffs into the lungs every 6 (six) hours as needed.        . senna (SENOKOT) 8.6 MG tablet Take 1 tablet by mouth daily as needed.         Review of Systems Per HPI    Objective:   Physical Exam  Nursing note and vitals reviewed. Constitutional: He is oriented to person, place, and time. He appears well-developed and well-nourished. No distress.       Weak, tired  HENT:  Head: Normocephalic and atraumatic.  Mouth/Throat: Oropharynx is clear  and moist. No  oropharyngeal exudate.  Eyes: Conjunctivae and EOM are normal. Pupils are equal, round, and reactive to light. No scleral icterus.  Neck: Normal range of motion. Neck supple.  Cardiovascular: Normal rate, regular rhythm and intact distal pulses.   Murmur (SEM) heard. Pulmonary/Chest: No respiratory distress. He has wheezes (mild LUL). He has no rhonchi. He has no rales.       Increased WOB.  Coarse breath sounds throughout  Musculoskeletal: He exhibits no edema.  Lymphadenopathy:    He has no cervical adenopathy.  Neurological: He is alert and oriented to person, place, and time.  Skin: Skin is warm and dry. No rash noted.  Psychiatric: He has a normal mood and affect.       Assessment & Plan:

## 2011-04-19 NOTE — ED Provider Notes (Signed)
History     CSN: [id number]  Arrival date & time 04/19/11  1349   First MD Initiated Contact with Patient 04/19/11 1419      Chief Complaint  Patient presents with  . Pneumonia    (Consider location/radiation/quality/duration/timing/severity/associated sxs/prior treatment) Patient is a 78 y.o. male presenting with pneumonia. The history is provided by the patient and a relative.  Pneumonia  He has had a cough productive of greenish to yellowish sputum. Cough started about 2 days ago and has been getting worse. He started having dyspnea 2 days ago and got significantly worse yesterday afternoon. He's had a low-grade fever, never higher than 100. He's had chills and sweats. He denies arthralgias or myalgias and denies chest pain. He was unable to lay flat last night. He went to his physician today where he was noted to be hypoxic in the office and chest x-ray showed a probable pneumonia and he was told to come to the emergency department.  Past Medical History  Diagnosis Date  . Cancer     Lung  . COPD (chronic obstructive pulmonary disease)   . Diverticulosis of colon   . GERD (gastroesophageal reflux disease)     Stricture  . Hyperlipidemia   . Hypertension   . Esophageal dysmotility   . Osteoarthritis   . Aortic stenosis     s/p AVR CABG  . Mild renal insufficiency   . Erectile dysfunction   . Venous insufficiency   . Gout   . BPH (benign prostatic hypertrophy)   . Atrioventricular block, complete     cx cabg/AVR  . Pacemaker     st judes    Past Surgical History  Procedure Date  . Aortic valve replacement 05/04/09    with CABG x 3  . Pacemaker insertion 05/08/09    [employer], dual chamber  . Cholecystectomy   . Tonsillectomy   . Lobectomy 1991    Lung  . Prostate surgery 1990  . Thumb surgery     Left  . Varicose vein surgery   . Basal cell surgery     Nose  . Esophagogastroduodenoscopy 10/2005    Stricture  . Cardiolite 11/2003    Neg.  Mild to  moderate AS  . Carotid US 11/2005    Neg  . Esophagogastroduodenoscopy 07/2004    Gastritis, stricture, dysmotility  . Rib films 08/2005    Right 10th rib fracture  . Doppler echocardiography 09/2005    Stable  . 2d echocardiogram 06/2007    Mod aortic stenosis with EF 55%  . Colonoscopy     diverticulosis, small polyp, hemorrhoids    Family History  Problem Relation Age of Onset  . Cancer Mother     Pancreatic  . Heart disease Father     CAD  . Cancer Sister     ? abdominal CA  . Cancer Brother     Colon  . Heart disease Maternal Grandmother     History  Substance Use Topics  . Smoking status: Former Smoker    Types: Cigarettes    Quit date: 04/04/1989  . Smokeless tobacco: Not on file  . Alcohol Use: No      Review of Systems  All other systems reviewed and are negative.    Allergies  Penicillins  Home Medications   Current Outpatient Rx  Name Route Sig Dispense Refill  . ACETAMINOPHEN ER 650 MG PO TBCR  Take 3 tablets by mouth daily as needed.     Marland Kitchen  AMIODARONE HCL 200 MG PO TABS Oral Take 200 mg by mouth daily.     . ASPIRIN 81 MG PO TABS Oral Take 81 mg by mouth daily.      . BUDESONIDE-FORMOTEROL FUMARATE 160-4.5 MCG/ACT IN AERO Inhalation Inhale 2 puffs into the lungs 2 (two) times daily. 1 Inhaler 0    PATIENT NEEDS AN APPOINTMENT FOR FURTHER REFILLS.  Marland Kitchen COLCHICINE 0.6 MG PO TABS Oral Take 1 tablet (0.6 mg total) by mouth 2 (two) times daily as needed. 60 tablet 1  . DIPHENHYDRAMINE HCL 25 MG PO TABS Oral Take 25 mg by mouth every 6 (six) hours as needed.      Marland Kitchen DOCUSATE SODIUM 100 MG PO CAPS Oral Take 100 mg by mouth 2 (two) times daily as needed.      . FUROSEMIDE 40 MG PO TABS Oral Take 1 tablet (40 mg total) by mouth daily. 30 tablet 10  . HYDROCODONE-ACETAMINOPHEN 5-500 MG PO TABS  As needed    . LANSOPRAZOLE 30 MG PO CPDR Oral Take 1 capsule (30 mg total) by mouth 2 (two) times daily. 60 capsule 11  . LEVALBUTEROL TARTRATE 45 MCG/ACT IN AERO  Inhalation Inhale 1-2 puffs into the lungs every 6 (six) hours as needed.      . MELOXICAM 7.5 MG PO TABS  TAKE 1 TABLET BY MOUTH ONCE A DAY AS NEEDED 90 tablet 1    NEEDS REFILLS  . METOPROLOL TARTRATE 50 MG PO TABS Oral Take 1 tablet (50 mg total) by mouth 2 (two) times daily. 60 tablet 11  . SENNOSIDES 8.6 MG PO TABS Oral Take 1 tablet by mouth daily as needed.      . SERTRALINE HCL 25 MG PO TABS Oral Take 1 tablet (25 mg total) by mouth daily. 30 tablet 6  . WARFARIN SODIUM 2.5 MG PO TABS  Take as directed by Anticoagulation clinic.  Pt takes up to 1 1/2 tablets daily 45 tablet 3    BP 137/70  Pulse 83  Temp(Src) 99.7 F (37.6 C) (Oral)  Resp 18  SpO2 93%  Physical Exam  Nursing note and vitals reviewed.  78 year old male who appears dyspneic. Vital signs are normal. Oxygen saturation is 91% which is hypoxic. Head is normocephalic and atraumatic. PERRLA, EOMI. Oropharynx is clear. Neck is supple without adenopathy or JVD and is nontender. Back is nontender. Lungs have diminished breath sounds with a slightly prolonged exhalation phase. There no overt rales, wheezes, or rhonchi. Heart has regular rate and rhythm without murmur. There is no chest wall tenderness. Abdomen is soft, flat, nontender without masses or hepatosplenomegaly. Extremities have no cyanosis or edema, but moderately severe venous stasis changes are present. Skin is warm and moist without rash other than venous stasis changes. Neurologic: Mental status is normal, cranial nerves are intact, there no focal motor or sensory deficits. Psychiatric: No abnormalities of mood or affect.  ED Course  Procedures (including critical care time)   Labs Reviewed  CBC  DIFFERENTIAL  BASIC METABOLIC PANEL  PRO B NATRIURETIC PEPTIDE   Dg Chest 2 View  04/19/2011  *RADIOLOGY REPORT*  Clinical Data: Productive cough, fever, congestion  CHEST - 2 VIEW  Comparison: 07/03/2009  Findings: Cardiomediastinal silhouette is stable.  Status  post CABG again noted.  Dual lead cardiac pacemaker is unchanged in position. Stable chronic interstitial prominence and emphysematous changes. There is hazy airspace disease in the right upper lobe best visualized on lateral view highly suspicious for superimposed infiltrate/pneumonia.  Follow-up to resolution after appropriate treatment is recommended.  IMPRESSION:  Stable chronic interstitial prominence and emphysematous changes. There is hazy airspace disease in the right upper lobe best visualized on lateral view highly suspicious for superimposed infiltrate/pneumonia.  Follow-up to resolution after appropriate treatment is recommended.  Original Report Authenticated By: Natasha Mead, M.D.   Outpatient chest x-ray was reviewed and are very faint right upper lobe infiltrate is present. Given the patient's symptoms and hypoxia, this will be treated as a pneumonia and he is given initial doses of Rocephin and Zithromax.  1. Right upper lobe pneumonia    Laboratory workup is still pending at this time. [facility] hospitalists will be called for admission at once labs are back.   MDM  COPD with community-acquired pneumonia. Need to consider possibility of superimposed influenza.        Dione Booze, MD 04/19/11 [phone]

## 2011-04-19 NOTE — H&P (Signed)
Patient's PCP: Crawford Givens, MD, MD  Chief Complaint: Shortness of breath  History of Present Illness: Adam [name] is a 78 y.o. Caucasian male with history of lung cancer status post left lower lobe lobectomy, history of COPD, GERD, hypertension, hyperlipidemia, history of aortic stenosis status post aortic valve replacement, history of coronary disease status post CABG, history of paroxysmal A. fib on chronic anticoagulation, and other medical concerns who presents with the above complaints.  Patient noted that his symptoms started on 04/16/2011 when he was not feeling well.  Over the weekend he continued to decline.  He was feeling increasingly weak and short of breath with cough productive for yellowish sputum.  He has not had any fevers at home but has had chills.  Does complain of chest pain with cough.  Patient is chronically on 2 L of oxygen and has had to have his oxygen increased at home.  Patient went to his primary care physician's office Dr. Sharen Hones evaluated the patient in place of Dr. Para March his primary care physician.  Patient had imaging suggesting pneumonia as a result he was brought to the emergency department for further evaluation.  After evaluation the hospitalist service was asked to admit the patient for management of his pneumonia.  Meds: Scheduled Meds:   . sodium chloride   Intravenous Once  . albuterol  2.5 mg Nebulization Once  . azithromycin (ZITHROMAX) 500 MG IVPB  500 mg Intravenous Once  . cefTRIAXone (ROCEPHIN)  IV  1 g Intravenous Once  . ipratropium  0.5 mg Nebulization Once   Continuous Infusions:  PRN Meds:. Allergies: Penicillins Past Medical History  Diagnosis Date  . Cancer     Lung  . COPD (chronic obstructive pulmonary disease)   . Diverticulosis of colon   . GERD (gastroesophageal reflux disease)     Stricture  . Hyperlipidemia   . Hypertension   . Esophageal dysmotility   . Osteoarthritis   . Aortic stenosis     s/p AVR CABG  . Mild  renal insufficiency   . Erectile dysfunction   . Venous insufficiency   . Gout   . BPH (benign prostatic hypertrophy)   . Atrioventricular block, complete     cx cabg/AVR  . Pacemaker     st judes   Past Surgical History  Procedure Date  . Aortic valve replacement 05/04/09    with CABG x 3  . Pacemaker insertion 05/08/09    [employer], dual chamber  . Cholecystectomy   . Tonsillectomy   . Lobectomy 1991    Lung  . Prostate surgery 1990  . Thumb surgery     Left  . Varicose vein surgery   . Basal cell surgery     Nose  . Esophagogastroduodenoscopy 10/2005    Stricture  . Cardiolite 11/2003    Neg.  Mild to moderate AS  . Carotid US 11/2005    Neg  . Esophagogastroduodenoscopy 07/2004    Gastritis, stricture, dysmotility  . Rib films 08/2005    Right 10th rib fracture  . Doppler echocardiography 09/2005    Stable  . 2d echocardiogram 06/2007    Mod aortic stenosis with EF 55%  . Colonoscopy     diverticulosis, small polyp, hemorrhoids   Family History  Problem Relation Age of Onset  . Cancer Mother     Pancreatic  . Heart disease Father     CAD  . Cancer Sister     ? abdominal CA  . Cancer  Brother     Colon  . Heart disease Maternal Grandmother    History   Social History  . Marital Status: Married    Spouse Name: N/A    Number of Children: N/A  . Years of Education: N/A   Occupational History  . Retired Forensic scientist    Social History Main Topics  . Smoking status: Former Smoker -- 40 years    Types: Cigarettes    Quit date: 04/04/1989  . Smokeless tobacco: Never Used  . Alcohol Use: No  . Drug Use: No  . Sexually Active: Not on file   Other Topics Concern  . Not on file   Social History Narrative   Caffeine:  Daily, coffee at breakfast.Exercise:  GolfPositive asbestos exposure in the [employer].   Review of Systems: All systems reviewed with the patient and positive as per history of present illness, otherwise all other systems are  negative.  Physical Exam: Blood pressure 120/63, pulse 83, temperature 99.7 F (37.6 C), temperature source Oral, resp. rate 22, SpO2 90.00%. General: Awake, Oriented x3, No acute distress. HEENT: EOMI, Moist mucous membranes Neck: Supple CV: S1 and S2 Lungs: Moderate air movement bilaterally.  No wheezing. Abdomen: Soft, Nontender, Nondistended, +bowel sounds. Ext: Good pulses. Trace edema. No clubbing or cyanosis noted. Neuro: Cranial Nerves II-XII grossly intact. Has 5/5 motor strength in upper and lower extremities.  Lab results:  Basename 04/19/11 1540  NA 137  K 4.4  CL 101  CO2 24  GLUCOSE 105*  BUN 25*  CREATININE 1.88*  CALCIUM 9.2  MG --  PHOS --   No results found for this basename: AST:2,ALT:2,ALKPHOS:2,BILITOT:2,PROT:2,ALBUMIN:2 in the last 72 hours No results found for this basename: LIPASE:2,AMYLASE:2 in the last 72 hours  Basename 04/19/11 1540  WBC 11.8*  NEUTROABS 9.8*  HGB 13.3  HCT 40.8  MCV 92.7  PLT 185   No results found for this basename: CKTOTAL:3,CKMB:3,CKMBINDEX:3,TROPONINI:3 in the last 72 hours No components found with this basename: POCBNP:3 No results found for this basename: DDIMER in the last 72 hours No results found for this basename: HGBA1C:2 in the last 72 hours No results found for this basename: CHOL:2,HDL:2,LDLCALC:2,TRIG:2,CHOLHDL:2,LDLDIRECT:2 in the last 72 hours No results found for this basename: TSH,T4TOTAL,FREET3,T3FREE,THYROIDAB in the last 72 hours No results found for this basename: VITAMINB12:2,FOLATE:2,FERRITIN:2,TIBC:2,IRON:2,RETICCTPCT:2 in the last 72 hours Imaging results:  Dg Chest 2 View  04/19/2011  *RADIOLOGY REPORT*  Clinical Data: Productive cough, fever, congestion  CHEST - 2 VIEW  Comparison: 07/03/2009  Findings: Cardiomediastinal silhouette is stable.  Status post CABG again noted.  Dual lead cardiac pacemaker is unchanged in position. Stable chronic interstitial prominence and emphysematous changes.  There is hazy airspace disease in the right upper lobe best visualized on lateral view highly suspicious for superimposed infiltrate/pneumonia.  Follow-up to resolution after appropriate treatment is recommended.  IMPRESSION:  Stable chronic interstitial prominence and emphysematous changes. There is hazy airspace disease in the right upper lobe best visualized on lateral view highly suspicious for superimposed infiltrate/pneumonia.  Follow-up to resolution after appropriate treatment is recommended.  Original Report Authenticated By: Natasha Mead, M.D.   Other results: EKG: LBBB.  Assessment & Plan by Problem: 1. Acute-on-chronic respiratory failure likely due to community associated pneumonia.  Start patient on ceftriaxone and azithromycin.  Change antibiotics depending on patient's clinical course.  Currently the patient is on 2 L of oxygen in the room, which is the home dose, and saturating appropriately.  2. Community acquired pneumonia.  Management  as indicated above.  3.  History of paroxysmal A. fib.  Currently in a sinus rhythm.  Rate controlled.  Continue anticoagulation.  INR is therapeutic at this time.  4.  History of coronary artery disease/aortic valve replacement.  Stable at this time. Patient had a 2-D echocardiogram on 07/30/2009 which showed ejection fraction was 50% at that time.  BNP is elevated patient does not appear to be volume overloaded on exam, suspect BNP elevation is likely due to chronic kidney disease stage IV.  Continue aspirin and metoprolol.  Saline lock IV.  5.  Hypertension.  Continue home medications with hold parameters.  6.  Hyperlipidemia.  Stable.  7.  Chronic kidney disease stage IV.  Creatinine stable at this time continue to trend.  8.  Chronic anticoagulation.  INR therapeutic at this time.  Continue dosing as per pharmacy.  9.  History of COPD.  Stable at this time.  Patient does not appear to be wheezing on exam.  10.  Leukocytosis.  Likely due to  commit acquired pneumonia.  11.  Prophylaxis.  INR therapeutic.  12.  Generalized weakness.  Will have PT/OT evaluate the patient.  13.  CODE STATUS.  DO NOT RESUSCITATE/DO NOT INTUBATE this was discussed with the patient and wife at the time of admission.  Both reported that they have had extensive discussions in the past and have agreed to this as already noted in their living will which they did not bring to the hospital at this time.  Time spent on admission, talking to the patient, and coordinating care was: 50 mins.  Everline Mahaffy A, MD 04/19/2011, 7:24 PM

## 2011-04-19 NOTE — ED Notes (Signed)
Sent by [facility]--Pulmonology, dyspena x7 days, chest xray shows possible RUL Pneumonia

## 2011-04-20 ENCOUNTER — Inpatient Hospital Stay (HOSPITAL_COMMUNITY): Payer: Medicare HMO

## 2011-04-20 ENCOUNTER — Encounter: Payer: Self-pay | Admitting: Family Medicine

## 2011-04-20 DIAGNOSIS — Z66 Do not resuscitate: Secondary | ICD-10-CM | POA: Insufficient documentation

## 2011-04-20 LAB — CBC
HCT: 36.6 % — ABNORMAL LOW (ref 39.0–52.0)
Hemoglobin: 11.7 g/dL — ABNORMAL LOW (ref 13.0–17.0)
MCV: 93.4 fL (ref 78.0–100.0)
RBC: 3.92 MIL/uL — ABNORMAL LOW (ref 4.22–5.81)
WBC: 9.9 10*3/uL (ref 4.0–10.5)

## 2011-04-20 LAB — PROTIME-INR
INR: 3.37 — ABNORMAL HIGH (ref 0.00–1.49)
Prothrombin Time: 34.6 seconds — ABNORMAL HIGH (ref 11.6–15.2)

## 2011-04-20 LAB — CARDIAC PANEL(CRET KIN+CKTOT+MB+TROPI)
CK, MB: 2.5 ng/mL (ref 0.3–4.0)
CK, MB: 2.6 ng/mL (ref 0.3–4.0)
Relative Index: INVALID (ref 0.0–2.5)
Troponin I: <0.3 ng/mL (ref <0.30)

## 2011-04-20 LAB — EXPECTORATED SPUTUM ASSESSMENT W GRAM STAIN, RFLX TO RESP C

## 2011-04-20 LAB — STREP PNEUMONIAE URINARY ANTIGEN: Strep Pneumo Urinary Antigen: NEGATIVE

## 2011-04-20 LAB — BASIC METABOLIC PANEL
CO2: 27 mEq/L (ref 19–32)
Chloride: 102 mEq/L (ref 96–112)
Sodium: 138 mEq/L (ref 135–145)

## 2011-04-20 MED ORDER — PREDNISONE 50 MG PO TABS
60.0000 mg | ORAL_TABLET | Freq: Every day | ORAL | Status: DC
  Administered 2011-04-20 – 2011-04-21 (×2): 60 mg via ORAL
  Filled 2011-04-20 (×2): qty 1

## 2011-04-20 MED ORDER — LEVALBUTEROL TARTRATE 45 MCG/ACT IN AERO
1.0000 | INHALATION_SPRAY | Freq: Three times a day (TID) | RESPIRATORY_TRACT | Status: DC
  Administered 2011-04-20: 2 via RESPIRATORY_TRACT
  Filled 2011-04-20: qty 15

## 2011-04-20 MED ORDER — SODIUM CHLORIDE 0.9 % IV SOLN: 250.0000 mL | INTRAVENOUS | Status: DC | PRN

## 2011-04-20 MED ORDER — LEVALBUTEROL TARTRATE 45 MCG/ACT IN AERO
2.0000 | INHALATION_SPRAY | Freq: Three times a day (TID) | RESPIRATORY_TRACT | Status: DC
  Administered 2011-04-21 – 2011-04-22 (×4): 2 via RESPIRATORY_TRACT
  Filled 2011-04-20: qty 15

## 2011-04-20 NOTE — Progress Notes (Signed)
ANTICOAGULATION CONSULT NOTE   Pharmacy Consult for Coumadin Indication: atrial fibrillation  Allergies  Allergen Reactions  . Penicillins     Patient Measurements: Height: 5\' 11"  (180.3 cm) Weight: 186 lb 15.2 oz (84.8 kg) IBW/kg (Calculated) : 75.3     Vital Signs: Temp: 97.3 F (36.3 C) (01/16 0609) Temp src: Oral (01/16 0609) BP: 96/56 mmHg (01/16 0609) Pulse Rate: 77  (01/16 0609)  Labs:  Basename 04/20/11 0730 04/20/11 0525 04/20/11 0144 04/19/11 1940 04/19/11 1540  HGB -- 11.7* -- -- 13.3  HCT -- 36.6* -- -- 40.8  PLT -- 171 -- -- 185  APTT -- -- -- -- --  LABPROT -- 34.6* -- -- 28.2*  INR -- 3.37* -- -- 2.59*  HEPARINUNFRC -- -- -- -- --  CREATININE -- 2.13* -- -- 1.88*  CKTOTAL 44 -- 51 48 --  CKMB 2.6 -- 2.5 2.4 --  TROPONINI <0.30 -- <0.30 <0.30 --   Estimated Creatinine Clearance: 25.5 ml/min (by C-G formula based on Cr of 2.13).  Medical History: Past Medical History  Diagnosis Date  . Cancer     Lung  . COPD (chronic obstructive pulmonary disease)   . Diverticulosis of colon   . GERD (gastroesophageal reflux disease)     Stricture  . Hyperlipidemia   . Hypertension   . Esophageal dysmotility   . Osteoarthritis   . Aortic stenosis     s/p AVR CABG  . Mild renal insufficiency   . Erectile dysfunction   . Venous insufficiency   . Gout   . BPH (benign prostatic hypertrophy)   . Atrioventricular block, complete     cx cabg/AVR  . Pacemaker     st judes    Medications:  Scheduled:     . sodium chloride   Intravenous Once  . albuterol  2.5 mg Nebulization Once  . amiodarone  200 mg Oral Daily  . aspirin  81 mg Oral Daily  . azithromycin (ZITHROMAX) 500 MG IVPB  500 mg Intravenous Once  . azithromycin  500 mg Intravenous Q24H  . budesonide-formoterol  2 puff Inhalation BID  . cefTRIAXone (ROCEPHIN)  IV  1 g Intravenous Once  . cefTRIAXone (ROCEPHIN)  IV  1 g Intravenous Q24H  . furosemide  40 mg Oral Daily  . ipratropium  0.5 mg  Nebulization Once  . metoprolol  50 mg Oral BID  . pantoprazole  40 mg Oral Daily  . sertraline  12.5 mg Oral Daily  . sodium chloride  3 mL Intravenous Q12H  . DISCONTD: azithromycin  500 mg Intravenous Q24H  . DISCONTD: cefTRIAXone (ROCEPHIN)  IV  1 g Intravenous Q24H   Assessment:  78 year old male on Coumadin PTA for history of atrial fibrillation admit with pneumonia.  Coumadin home regimen = 3.75mg  daily except for 2.5mg  on Thursdays.    INR supratherapeutic today after large INR increase.  (Pt reports taking normal daily dose on 1/15 before hospital arrival)  No bleeding reported  Goal of Therapy:  INR 2-3   Plan:  1.  No warfarin today 2. Follow daily PT/INR  Lynann Beaver PharmD   Pager [phone] 04/20/2011 9:02 AM

## 2011-04-20 NOTE — Progress Notes (Signed)
Patient SOB upon exertion. Patient working with occupational therapy.  Patient's oxygen level decreased into low 80's while on 2.5 Liters nasal canula.  Patient sitting in chair and oxygen increased to 3 liters nasal canula.  Within one minute, patient's oxygen saturation increased to 91%.  Will continue to monitor patient.

## 2011-04-20 NOTE — Progress Notes (Signed)
Occupational Therapy Evaluation Patient Details Name: Adam [name] MRN: [id number] DOB: 1920/08/30 Today's Date: 04/20/2011   [id number] ev2  Problem List:  Patient Active Problem List  Diagnoses  . HYPERLIPIDEMIA  . GOUTY ARTHROPATHY  . ERECTILE DYSFUNCTION  . HYPERTENSION  . CAD  . AORTIC STENOSIS  . ATRIAL FIBRILLATION, PAROXYSMAL  . PREMATURE VENTRICULAR CONTRACTIONS  . HEMORRHOIDS, EXTERNAL  . VENOUS INSUFFICIENCY  . ALLERGIC RHINITIS  . COPD  . INTERSTITIAL LUNG DISEASE  . ESOPHAGEAL STRICTURE  . GERD  . HIATAL HERNIA WITH REFLUX  . DIVERTICULOSIS, COLON  . CONSTIPATION  . Chronic kidney disease (CKD), stage IV (severe)  . OSTEOARTHRITIS  . ARTHRITIS  . SYNCOPE  . SHORTNESS OF BREATH  . ALLERGY  . LUNG CANCER, HX OF  . BENIGN PROSTATIC HYPERTROPHY, HX OF  . BRONCHITIS, ACUTE  . Long term current use of anticoagulant  . Back pain  . Pacemaker  . Atrioventricular block, complete  . Pneumonia  . Acute-on-chronic respiratory failure  . Community acquired pneumonia  . Leukocytosis  . DNR (do not resuscitate)    Past Medical History:  Past Medical History  Diagnosis Date  . Cancer     Lung  . COPD (chronic obstructive pulmonary disease)   . Diverticulosis of colon   . GERD (gastroesophageal reflux disease)     Stricture  . Hyperlipidemia   . Hypertension   . Esophageal dysmotility   . Osteoarthritis   . Aortic stenosis     s/p AVR CABG  . Mild renal insufficiency   . Erectile dysfunction   . Venous insufficiency   . Gout   . BPH (benign prostatic hypertrophy)   . Atrioventricular block, complete     cx cabg/AVR  . Pacemaker     st judes   Past Surgical History:  Past Surgical History  Procedure Date  . Aortic valve replacement 05/04/09    with CABG x 3  . Pacemaker insertion 05/08/09    [employer], dual chamber  . Cholecystectomy   . Tonsillectomy   . Lobectomy 1991    Lung  . Prostate surgery 1990  . Thumb surgery     Left  .  Varicose vein surgery   . Basal cell surgery     Nose  . Esophagogastroduodenoscopy 10/2005    Stricture  . Cardiolite 11/2003    Neg.  Mild to moderate AS  . Carotid US 11/2005    Neg  . Esophagogastroduodenoscopy 07/2004    Gastritis, stricture, dysmotility  . Rib films 08/2005    Right 10th rib fracture  . Doppler echocardiography 09/2005    Stable  . 2d echocardiogram 06/2007    Mod aortic stenosis with EF 55%  . Colonoscopy     diverticulosis, small polyp, hemorrhoids    OT Assessment/Plan/Recommendation OT Assessment Clinical Impression Statement: This 78 year old male was admitted for respiratory failure.  He has a cardiac history and lung CA history.  He is overall min A to min guard for ADLs.  Goals are supervision in acute, following energy conservation strategies. OT Recommendation/Assessment: Patient will need skilled OT in the acute care venue OT Problem List: Decreased strength;Decreased activity tolerance;Impaired balance (sitting and/or standing);Decreased safety awareness;Cardiopulmonary status limiting activity;Decreased knowledge of use of DME or AE Barriers to Discharge: Other (comment) (no family to discuss level of assistance) OT Therapy Diagnosis : Generalized weakness OT Plan OT Frequency: Min 2X/week OT Treatment/Interventions: Self-care/ADL training;Energy conservation;DME and/or AE instruction;Therapeutic activities;Patient/family education;Balance training  OT Recommendation Follow Up Recommendations: Home health OT;Other (comment) (depending upon progress) Equipment Recommended: Other (comment) (to be further assessed?  3:1/tub DME) Individuals Consulted Consulted and Agree with Results and Recommendations: Patient OT Goals Acute Rehab OT Goals OT Goal Formulation: With patient Time For Goal Achievement: 2 weeks ADL Goals Pt Will Perform Grooming: with supervision;Standing at sink ADL Goal: Grooming - Progress: Not met Pt Will Perform Lower Body  Bathing: with supervision;Sit to stand from chair ADL Goal: Lower Body Bathing - Progress: Not met Pt Will Perform Lower Body Dressing: with supervision;Sit to stand from chair ADL Goal: Lower Body Dressing - Progress: Not met Pt Will Transfer to Toilet: with supervision;Ambulation;3-in-1 ADL Goal: Toilet Transfer - Progress: Not met Pt Will Perform Toileting - Clothing Manipulation: with supervision;Standing ADL Goal: Toileting - Clothing Manipulation - Progress: Not met Pt Will Perform Toileting - Hygiene: with supervision;Sit to stand from 3-in-1/toilet ADL Goal: Toileting - Hygiene - Progress: Not met Pt Will Perform Tub/Shower Transfer: Other (comment);Ambulation;with DME;Tub transfer (min guard with bench or seat (tub)) ADL Goal: Tub/Shower Transfer - Progress: Not met Miscellaneous OT Goals Miscellaneous OT Goal #1: Pt will initiate at least one rest break for energy conservation per session OT Goal: Miscellaneous Goal #1 - Progress: Not met  OT Evaluation Precautions/Restrictions  Precautions Precautions: Fall Restrictions Weight Bearing Restrictions: No Prior Functioning Home Living Lives With: Spouse Type of Home: House Home Layout: Two level;Able to live on main level with bedroom/bathroom (has been staying downstairs:  normally upstairs) Home Access: Stairs to enter Entrance Stairs-Number of Steps: 1 Bathroom Shower/Tub: Tub/shower unit;Other (comment) (stands) Bathroom Toilet: Standard Additional Comments: doesn't use any dme Prior Function Level of Independence: Independent with basic ADLs Vocation: Retired Comments: educated on pursed lip breathing:  pt uses 02at home ADL ADL Grooming: Performed;Wash/dry hands;Other (comment) (min guard) Where Assessed - Grooming: Standing at sink Upper Body Bathing: Simulated;Supervision/safety Where Assessed - Upper Body Bathing: Sitting, chair;Supported Lower Body Bathing: Simulated;Minimal assistance Where Assessed -  Lower Body Bathing: Sit to stand from chair Upper Body Dressing: Simulated;Set up Where Assessed - Upper Body Dressing: Supported;Sitting, chair Lower Body Dressing: Minimal assistance;Simulated Where Assessed - Lower Body Dressing: Sit to stand from chair Toilet Transfer: Simulated;Minimal assistance;Other (comment) (to recliner  few steps from sink no AD) Toilet Transfer Method: Ambulating Toileting - Clothing Manipulation: Simulated;Other (comment) (min guard) Where Assessed - Toileting Clothing Manipulation: Standing Toileting - Hygiene: Simulated;Other (comment) (min guard) Where Assessed - Toileting Hygiene: Sit to stand from 3-in-1 or toilet Ambulation Related to ADLs: min A without AD:  unsteady but no LOB ADL Comments: Began energy conservation education:  pt desats:  normally stands in shower Vision/Perception  Vision - History Patient Visual Report: No change from baseline Cognition Cognition Overall Cognitive Status: Appears within functional limits for tasks assessed (bed alarm on for safety:  chair alarm used) Cognition - Other Comments: Pt had ambien, dozed off, but alert when up.  Miscalculated # yrs married Sensation/Coordination   Extremity Assessment RUE Assessment RUE Assessment: Within Functional Limits LUE Assessment LUE Assessment: Within Functional Limits Mobility  Bed Mobility Bed Mobility: Yes Supine to Sit: 5: Supervision Transfers Transfers: Yes Sit to Stand:  (min guard) Exercises   End of Session OT - End of Session Activity Tolerance: Other (comment) (desaturates:  lots of rests needed) Patient left: in chair;with call bell in reach;with bed alarm set Nurse Communication: Mobility status for transfers General Behavior During Session: [facility] for tasks performed Cognition: [facility] for tasks  performed (mostly:  bed alarm for safety)  Marica Otter, OTR/L [phone] 04/20/2011   Tris Howell 04/20/2011, 9:59 AM

## 2011-04-20 NOTE — Progress Notes (Signed)
Physical Therapy Evaluation Patient Details Name: Adam [name] MRN: [id number] DOB: 16-Apr-1920 Today's Date: 04/20/2011 1135-1210 Ev2  Problem List:  Patient Active Problem List  Diagnoses  . HYPERLIPIDEMIA  . GOUTY ARTHROPATHY  . ERECTILE DYSFUNCTION  . HYPERTENSION  . CAD  . AORTIC STENOSIS  . ATRIAL FIBRILLATION, PAROXYSMAL  . PREMATURE VENTRICULAR CONTRACTIONS  . HEMORRHOIDS, EXTERNAL  . VENOUS INSUFFICIENCY  . ALLERGIC RHINITIS  . COPD  . INTERSTITIAL LUNG DISEASE  . ESOPHAGEAL STRICTURE  . GERD  . HIATAL HERNIA WITH REFLUX  . DIVERTICULOSIS, COLON  . CONSTIPATION  . Chronic kidney disease (CKD), stage IV (severe)  . OSTEOARTHRITIS  . ARTHRITIS  . SYNCOPE  . SHORTNESS OF BREATH  . ALLERGY  . LUNG CANCER, HX OF  . BENIGN PROSTATIC HYPERTROPHY, HX OF  . BRONCHITIS, ACUTE  . Long term current use of anticoagulant  . Back pain  . Pacemaker  . Atrioventricular block, complete  . Pneumonia  . Acute-on-chronic respiratory failure  . Community acquired pneumonia  . Leukocytosis  . DNR (do not resuscitate)    Past Medical History:  Past Medical History  Diagnosis Date  . Cancer     Lung  . COPD (chronic obstructive pulmonary disease)   . Diverticulosis of colon   . GERD (gastroesophageal reflux disease)     Stricture  . Hyperlipidemia   . Hypertension   . Esophageal dysmotility   . Osteoarthritis   . Aortic stenosis     s/p AVR CABG  . Mild renal insufficiency   . Erectile dysfunction   . Venous insufficiency   . Gout   . BPH (benign prostatic hypertrophy)   . Atrioventricular block, complete     cx cabg/AVR  . Pacemaker     st judes   Past Surgical History:  Past Surgical History  Procedure Date  . Aortic valve replacement 05/04/09    with CABG x 3  . Pacemaker insertion 05/08/09    [employer], dual chamber  . Cholecystectomy   . Tonsillectomy   . Lobectomy 1991    Lung  . Prostate surgery 1990  . Thumb surgery     Left  .  Varicose vein surgery   . Basal cell surgery     Nose  . Esophagogastroduodenoscopy 10/2005    Stricture  . Cardiolite 11/2003    Neg.  Mild to moderate AS  . Carotid US 11/2005    Neg  . Esophagogastroduodenoscopy 07/2004    Gastritis, stricture, dysmotility  . Rib films 08/2005    Right 10th rib fracture  . Doppler echocardiography 09/2005    Stable  . 2d echocardiogram 06/2007    Mod aortic stenosis with EF 55%  . Colonoscopy     diverticulosis, small polyp, hemorrhoids    PT Assessment/Plan/Recommendation PT Assessment Clinical Impression Statement: Patient presents with pneumonia and fall prior to admission.  Has been getting around without device at home.  Now presents with decreased balance, decreased activity tolerance and will benefit from skilled PT in acute setting and via HHPT with wife to provide supervision at home.   PT Recommendation/Assessment: Patient will need skilled PT in the acute care venue PT Problem List: Decreased strength;Decreased activity tolerance;Decreased balance;Decreased mobility;Decreased knowledge of use of DME PT Therapy Diagnosis : Abnormality of gait;Generalized weakness PT Plan PT Frequency: Min 3X/week PT Treatment/Interventions: Gait training;DME instruction;Stair training;Functional mobility training;Therapeutic activities;Balance training;Patient/family education PT Recommendation Follow Up Recommendations: Home health PT;Supervision/Assistance - 24 hour Equipment Recommended: None recommended  by PT PT Goals  Acute Rehab PT Goals PT Goal Formulation: With patient/family Time For Goal Achievement: 7 days Pt will go Sit to Stand: with modified independence PT Goal: Sit to Stand - Progress: Goal set today Pt will go Stand to Sit: with modified independence PT Goal: Stand to Sit - Progress: Goal set today Pt will Stand: with modified independence;3 - 5 min;with unilateral upper extremity support (during simple functional task) PT Goal:  Stand - Progress: Goal set today Pt will Ambulate: >150 feet;with supervision;with rolling walker PT Goal: Ambulate - Progress: Goal set today Pt will Go Up / Down Stairs: 1-2 stairs;with min assist PT Goal: Up/Down Stairs - Progress: Goal set today  PT Evaluation Precautions/Restrictions  Precautions Precautions: Fall Prior Functioning  Home Living Lives With: Spouse Type of Home: House Home Layout: Two level;Full bath on main level;Able to live on main level with bedroom/bathroom Home Access: Stairs to enter Entrance Stairs-Rails: None Entrance Stairs-Number of Steps: 1 Bathroom Shower/Tub: Health visitor: Standard Home Adaptive Equipment: Hand-held shower hose;Built-in shower seat Additional Comments: has a shower seat which won't fit in shower due to built-in seat Prior Function Level of Independence: Independent with basic ADLs;Independent with transfers;Independent with gait Vocation: Retired Comments: educated on pursed lip breathing:  pt uses Secretary/administrator Arousal/Alertness: Awake/alert Overall Cognitive Status: Appears within functional limits for tasks assessed Cognition - Other Comments: Pt had ambien, dozed off, but alert when up.  Miscalculated # yrs married Sensation/Coordination   Extremity Assessment RUE Assessment RUE Assessment: Within Functional Limits LUE Assessment LUE Assessment: Within Functional Limits RLE Assessment RLE Assessment: Within Functional Limits LLE Assessment LLE Assessment: Within Functional Limits Mobility (including Balance) Bed Mobility Bed Mobility: No (up in chair) Supine to Sit: 5: Supervision Transfers Sit to Stand: 5: Supervision;From chair/3-in-1;With upper extremity assist;With armrests Ambulation/Gait Ambulation/Gait: Yes Ambulation/Gait Assistance: 4: Min assist Ambulation/Gait Assistance Details (indicate cue type and reason): cues for posture, decrease speed.  Held onto patients  arm Ambulation Distance (Feet): 200 Feet Assistive device: None Gait Pattern: Trunk flexed Gait velocity: increased speed with head forward and anterior bias  Posture/Postural Control Posture/Postural Control: Postural limitations Postural Limitations: head forward with trunk flexed and rounded shoulders Balance Balance Assessed: Yes Static Standing Balance Static Standing - Balance Support: No upper extremity supported Static Standing - Level of Assistance: 5: Stand by assistance Static Standing - Comment/# of Minutes: briefly unsupported without any loss of balance High Level Balance High Level Balance Activites: Head turns;Turns High Level Balance Comments: patient unsafe with ambulation without device with head turns and needed min assist for safety and fall prevention Exercise    End of Session PT - End of Session Equipment Utilized During Treatment: Gait belt Activity Tolerance:  (SpO2 down to 78% ambulating on 3L O2, up to 6L SpO2 90%) Patient left: in chair;with call bell in reach;with family/visitor present General Behavior During Session: [facility] for tasks performed Cognition: [facility] for tasks performed  [facility] [place] 04/20/2011, 1:42 PM

## 2011-04-20 NOTE — Progress Notes (Signed)
Subjective: "Took an [place] last night and boy is it working!" Denies pain/discomfort/improvement.   Objective: Vital signs Filed Vitals:   04/19/11 1624 04/19/11 1955 04/19/11 2204 04/20/11 0609  BP: 120/63 122/60 131/62 96/56  Pulse:  70 70 77  Temp:   99 F (37.2 C) 97.3 F (36.3 C)  TempSrc:   Oral Oral  Resp: 22 24 24 24   Height:   5\' 11"  (1.803 m)   Weight:   84.8 kg (186 lb 15.2 oz)   SpO2: 90% 91% 92% 92%   Weight change:     Intake/Output from previous day: 01/15 0701 - 01/16 0700 In: -  Out: 225 [Urine:225]     Physical Exam: General: Drowsy but awake, oriented x3, in no acute distress. HEENT: No bruits, no goiter. Mucus membranes moist Heart: Regular rate and rhythm, rubs, gallops. Lungs:Moderate increased work of breathing. Breath sounds with mild expiratory wheeze anterior chest, faint crackles rt base. Breath sounds are tight. +productive cough during exam Abdomen: Soft, nontender, nondistended, positive bowel sounds. Extremities: No clubbing cyanosis  with positive pedal pulses. Neuro: Grossly intact, nonfocal. Speech clear, MOE    Lab Results: Basic Metabolic Panel:  Basename 04/20/11 0525 04/19/11 1540  NA 138 137  K 4.2 4.4  CL 102 101  CO2 27 24  GLUCOSE 101* 105*  BUN 27* 25*  CREATININE 2.13* 1.88*  CALCIUM 8.9 9.2  MG -- --  PHOS -- --   Liver Function Tests: No results found for this basename: AST:2,ALT:2,ALKPHOS:2,BILITOT:2,PROT:2,ALBUMIN:2 in the last 72 hours No results found for this basename: LIPASE:2,AMYLASE:2 in the last 72 hours No results found for this basename: AMMONIA:2 in the last 72 hours CBC:  Basename 04/20/11 0525 04/19/11 1540  WBC 9.9 11.8*  NEUTROABS -- 9.8*  HGB 11.7* 13.3  HCT 36.6* 40.8  MCV 93.4 92.7  PLT 171 185   Cardiac Enzymes:  Basename 04/20/11 0730 04/20/11 0144 04/19/11 1940  CKTOTAL 44 51 48  CKMB 2.6 2.5 2.4  CKMBINDEX -- -- --  TROPONINI <0.30 <0.30 <0.30   BNP:  Basename 04/19/11  1540  PROBNP 7997.0*   D-Dimer: No results found for this basename: DDIMER:2 in the last 72 hours CBG: No results found for this basename: GLUCAP:6 in the last 72 hours Hemoglobin A1C: No results found for this basename: HGBA1C in the last 72 hours Fasting Lipid Panel: No results found for this basename: CHOL,HDL,LDLCALC,TRIG,CHOLHDL,LDLDIRECT in the last 72 hours Thyroid Function Tests: No results found for this basename: TSH,T4TOTAL,FREET4,T3FREE,THYROIDAB in the last 72 hours Anemia Panel: No results found for this basename: VITAMINB12,FOLATE,FERRITIN,TIBC,IRON,RETICCTPCT in the last 72 hours Coagulation:  Basename 04/20/11 0525 04/19/11 1540  LABPROT 34.6* 28.2*  INR 3.37* 2.59*   Urine Drug Screen: Drugs of Abuse  No results found for this basename: labopia, cocainscrnur, labbenz, amphetmu, thcu, labbarb    Alcohol Level: No results found for this basename: ETH:2 in the last 72 hours Urinalysis:  Misc. Labs:  Recent Results (from the past 240 hour(s))  CULTURE, SPUTUM-ASSESSMENT     Status: Normal   Collection Time   04/20/11  7:58 AM      Component Value Range Status Comment   Specimen Description SPUTUM   Final    Special Requests NONE   Final    Sputum evaluation     Final    Value: THIS SPECIMEN IS ACCEPTABLE. RESPIRATORY CULTURE REPORT TO FOLLOW.   Report Status 04/20/2011 FINAL   Final     Studies/Results: Dg Chest 2  View  04/19/2011  *RADIOLOGY REPORT*  Clinical Data: Productive cough, fever, congestion  CHEST - 2 VIEW  Comparison: 07/03/2009  Findings: Cardiomediastinal silhouette is stable.  Status post CABG again noted.  Dual lead cardiac pacemaker is unchanged in position. Stable chronic interstitial prominence and emphysematous changes. There is hazy airspace disease in the right upper lobe best visualized on lateral view highly suspicious for superimposed infiltrate/pneumonia.  Follow-up to resolution after appropriate treatment is recommended.   IMPRESSION:  Stable chronic interstitial prominence and emphysematous changes. There is hazy airspace disease in the right upper lobe best visualized on lateral view highly suspicious for superimposed infiltrate/pneumonia.  Follow-up to resolution after appropriate treatment is recommended.  Original Report Authenticated By: Natasha Mead, M.D.    Medications: Scheduled Meds:   . sodium chloride   Intravenous Once  . albuterol  2.5 mg Nebulization Once  . amiodarone  200 mg Oral Daily  . aspirin  81 mg Oral Daily  . azithromycin (ZITHROMAX) 500 MG IVPB  500 mg Intravenous Once  . azithromycin  500 mg Intravenous Q24H  . budesonide-formoterol  2 puff Inhalation BID  . cefTRIAXone (ROCEPHIN)  IV  1 g Intravenous Once  . cefTRIAXone (ROCEPHIN)  IV  1 g Intravenous Q24H  . furosemide  40 mg Oral Daily  . ipratropium  0.5 mg Nebulization Once  . metoprolol  50 mg Oral BID  . pantoprazole  40 mg Oral Daily  . sertraline  12.5 mg Oral Daily  . sodium chloride  3 mL Intravenous Q12H  . DISCONTD: azithromycin  500 mg Intravenous Q24H  . DISCONTD: cefTRIAXone (ROCEPHIN)  IV  1 g Intravenous Q24H   Continuous Infusions:  PRN Meds:.sodium chloride, colchicine, diphenhydrAMINE, docusate sodium, levalbuterol, meloxicam, senna, sodium chloride, zolpidem  Assessment/Plan:  Principal Problem:  Acute-on-chronic respiratory failure likely due to community associated pneumonia. Ceftriaxone and azithromycin day #2. Sputum sent for grm stain. Change antibiotics depending on patient's clinical course. Currently the patient is on 2 L of oxygen in the room, which is the home dose, and  Sats range 90-93. Chart review indicates baseline 97% . Continue inhalers. Consider prednisone.  2. Community acquired pneumonia. Management as indicated above.  3. History of paroxysmal A. fib. Currently in a sinus rhythm. Rate controlled. Continue anticoagulation. INR is therapeutic at this time.  4. History of coronary artery  disease/aortic valve replacement. Stable at this time. Patient had a 2-D echocardiogram on 07/30/2009 which showed ejection fraction was 50% at that time. BNP is elevated patient does not appear to be volume overloaded on exam, suspect BNP elevation is likely due to chronic kidney disease stage IV. Continue aspirin and metoprolol. Saline lock IV.  5. Hypertension. Continue home medications with hold parameters.  6. Hyperlipidemia. Stable.  7. Chronic kidney disease stage IV. Creatinine stable at this time continue to trend. Currently appears within baseline range according to chart review.  8. Chronic anticoagulation. INR therapeutic at this time. Continue dosing as per pharmacy.  9. History of COPD. Stable at this time. Patient does not appear to be wheezing on exam. Pt denies history of COPD. Has been on O2 since lobectomy  10. Leukocytosis. Likely due to commit acquired pneumonia. Improved 11. Prophylaxis. INR therapeutic. Pharmacy to dose 12. Generalized weakness. Will have PT/OT evaluate the patient. Fall precautions 13. CODE STATUS. DO NOT RESUSCITATE/DO NOT INTUBATE this was discussed with the patient and wife at the time of admission. Both reported that they have had extensive discussions in the past  and have agreed to this as already noted in their living will which they did not bring to the hospital at this time.      LOS: 1 day   [facility] M 04/20/2011, 8:58 AM  I have seen and examined Mr Teal and reviewed his chart. I discussed the plan of care with Clydie Braun. Given his age, history of copd and appearance of consolidation on cxr, I think he will benefit from a better view with a ct chest to assess possibility of postobstructive pna, unfortunately without contrast due to his creatinine. We will also gently rehydrate him.

## 2011-04-20 NOTE — Clinical Documentation Improvement (Signed)
GENERIC DOCUMENTATION CLARIFICATION QUERY  THIS DOCUMENT IS NOT A PERMANENT PART OF THE MEDICAL RECORD  TO RESPOND TO THE THIS QUERY, FOLLOW THE INSTRUCTIONS BELOW:  1. If needed, update documentation for the patient's encounter via the notes activity.  2. Access this query again and click edit on the In [employer].  3. After updating, or not, click F2 to complete all highlighted (required) fields concerning your review. Select "additional documentation in the medical record" OR "no additional documentation provided".  4. Click Sign note button.  5. The deficiency will fall out of your In Basket *Please let us know if you are not able to complete this workflow by phone or e-mail (listed below).  Please update your documentation within the medical record to reflect your response to this query.                                                                                        04/20/11   Dear Dr. Venetia Constable / Associates,  In a better effort to capture your patient's severity of illness, reflect appropriate length of stay and utilization of resources, a review of the patient medical record has revealed the following indicators.    Based on your clinical judgment, please clarify and document in a progress note and/or discharge summary the clinical condition associated with the following supporting information:  In responding to this query please exercise your independent judgment.  The fact that a query is asked, does not imply that any particular answer is desired or expected.  Pt admitted to Acute Resp Failure and PNA.  According to H/P BNP is elevated at 7997. Pt has history of aortic valve replacement, Aortic Stenosis, CAD, with a dual lead pacemaker.   Please clarify whether or not your are treating another associated diagnosis other than the ones listed above necessitating the treatment of lasix and metoprolol and document in pn and d/c summary.  Possible Clinical  Conditions?   _______Other Condition__________________ _______Cannot Clinically Determine   Supporting Information: Risk Factors: H/O CABG, HTN, CAD, Aortic Stenosis, Aortic valve replacement, Dual lead pacemaker, Acute Resp Failure, PNA HLD, AFIB  Signs & Symptoms: H/P Stable at this time. Patient had a 2-D echocardiogram on 07/30/2009 which showed ejection fraction was 50% at that time.  BNP is elevated patient does not appear to be volume overloaded on exam, suspect BNP elevation is likely due to chronic kidney disease stage IV.  coronary disease status post CABG, history of paroxysmal A. fib  History of coronary artery disease/aortic valve replacement.  Stable at this time. Patient had a 2-D echocardiogram on 07/30/2009 which showed ejection fraction was 50% at that time.  BNP is elevated patient does not appear to be volume overloaded on exam, suspect BNP elevation is likely due to chronic kidney disease stage IV.  Continue aspirin and metoprolol.  Saline lock IV.   Diagnostics: Component Pro B Natriuretic peptide (BNP)  Latest Ref Rng 0 - 450 pg/mL  04/19/2011 7997.0 (H)   Treatment LOPRESSOR Lasix  You may use possible, probable, or suspect with inpatient documentation. possible, probable, suspected diagnoses MUST be documented at the time of discharge  Reviewed: yes. thanks  Thank You,  Enis Slipper  RN, BSN, CCDS Clinical Documentation Specialist [facility] Dept Pager: [phone] / E-mail: [email or web address]  Health Information Management [place]

## 2011-04-21 LAB — CBC
HCT: 37.5 % — ABNORMAL LOW (ref 39.0–52.0)
Hemoglobin: 12 g/dL — ABNORMAL LOW (ref 13.0–17.0)
WBC: 8.1 10*3/uL (ref 4.0–10.5)

## 2011-04-21 LAB — BASIC METABOLIC PANEL
BUN: 38 mg/dL — ABNORMAL HIGH (ref 6–23)
Chloride: 99 mEq/L (ref 96–112)
GFR calc Af Amer: 27 mL/min — ABNORMAL LOW (ref >90)
GFR calc non Af Amer: 23 mL/min — ABNORMAL LOW (ref >90)
Glucose, Bld: 130 mg/dL — ABNORMAL HIGH (ref 70–99)
Potassium: 4.3 mEq/L (ref 3.5–5.1)
Sodium: 138 mEq/L (ref 135–145)

## 2011-04-21 LAB — PROTIME-INR
INR: 3.78 — ABNORMAL HIGH (ref 0.00–1.49)
Prothrombin Time: 37.9 seconds — ABNORMAL HIGH (ref 11.6–15.2)

## 2011-04-21 MED ORDER — PREDNISONE 20 MG PO TABS
40.0000 mg | ORAL_TABLET | Freq: Every day | ORAL | Status: DC
  Administered 2011-04-22 – 2011-04-25 (×4): 40 mg via ORAL
  Filled 2011-04-21 (×5): qty 2

## 2011-04-21 MED ORDER — TRAMADOL HCL 50 MG PO TABS
25.0000 mg | ORAL_TABLET | Freq: Four times a day (QID) | ORAL | Status: DC | PRN
  Administered 2011-04-24: 25 mg via ORAL
  Filled 2011-04-21: qty 1

## 2011-04-21 NOTE — Progress Notes (Signed)
ANTICOAGULATION CONSULT NOTE   Pharmacy Consult for Coumadin Indication: atrial fibrillation  Allergies  Allergen Reactions  . Penicillins     Patient Measurements: Height: 5\' 11"  (180.3 cm) Weight: 184 lb 4.9 oz (83.6 kg) IBW/kg (Calculated) : 75.3     Vital Signs: Temp: 97.8 F (36.6 C) (01/17 0635) Temp src: Oral (01/17 0635) BP: 121/71 mmHg (01/17 0635) Pulse Rate: 77  (01/17 0635)  Labs:  Basename 04/21/11 0500 04/20/11 0730 04/20/11 0525 04/20/11 0144 04/19/11 1940 04/19/11 1540  HGB 12.0* -- 11.7* -- -- --  HCT 37.5* -- 36.6* -- -- 40.8  PLT 192 -- 171 -- -- 185  APTT -- -- -- -- -- --  LABPROT 37.9* -- 34.6* -- -- 28.2*  INR 3.78* -- 3.37* -- -- 2.59*  HEPARINUNFRC -- -- -- -- -- --  CREATININE 2.37* -- 2.13* -- -- 1.88*  CKTOTAL -- 44 -- 51 48 --  CKMB -- 2.6 -- 2.5 2.4 --  TROPONINI -- <0.30 -- <0.30 <0.30 --   Estimated Creatinine Clearance: 22.9 ml/min (by C-G formula based on Cr of 2.37).  Medical History: Past Medical History  Diagnosis Date  . Cancer     Lung  . COPD (chronic obstructive pulmonary disease)   . Diverticulosis of colon   . GERD (gastroesophageal reflux disease)     Stricture  . Hyperlipidemia   . Hypertension   . Esophageal dysmotility   . Osteoarthritis   . Aortic stenosis     s/p AVR CABG  . Mild renal insufficiency   . Erectile dysfunction   . Venous insufficiency   . Gout   . BPH (benign prostatic hypertrophy)   . Atrioventricular block, complete     cx cabg/AVR  . Pacemaker     st judes    Medications:  Scheduled:     . amiodarone  200 mg Oral Daily  . aspirin  81 mg Oral Daily  . azithromycin  500 mg Intravenous Q24H  . budesonide-formoterol  2 puff Inhalation BID  . cefTRIAXone (ROCEPHIN)  IV  1 g Intravenous Q24H  . furosemide  40 mg Oral Daily  . levalbuterol  2 puff Inhalation TID  . metoprolol  50 mg Oral BID  . pantoprazole  40 mg Oral Daily  . predniSONE  60 mg Oral Q breakfast  . sertraline   12.5 mg Oral Daily  . sodium chloride  3 mL Intravenous Q12H  . DISCONTD: levalbuterol  1-2 puff Inhalation Q8H   Assessment:  78 year old male on Coumadin PTA for history of atrial fibrillation admit with pneumonia.  Coumadin home regimen = 3.75mg  daily except for 2.5mg  on Thursdays.   Noted drug interaction with amiodarone (continued from home)   INR supratherapeutic today despite holding dose on 1/16 (last dose PTA on 1/15)  No bleeding reported  Goal of Therapy:  INR 2-3   Plan:  1.  No warfarin today 2. Follow daily PT/INR  Lynann Beaver PharmD   Pager [phone] 04/21/2011 9:37 AM

## 2011-04-21 NOTE — Progress Notes (Signed)
SUBJECTIVE Feels ok, couldn't sleep. Less cough.   1. Right upper lobe pneumonia   2. CAP (community acquired pneumonia)     Past Medical History  Diagnosis Date  . Cancer     Lung  . COPD (chronic obstructive pulmonary disease)   . Diverticulosis of colon   . GERD (gastroesophageal reflux disease)     Stricture  . Hyperlipidemia   . Hypertension   . Esophageal dysmotility   . Osteoarthritis   . Aortic stenosis     s/p AVR CABG  . Mild renal insufficiency   . Erectile dysfunction   . Venous insufficiency   . Gout   . BPH (benign prostatic hypertrophy)   . Atrioventricular block, complete     cx cabg/AVR  . Pacemaker     st judes   Current Facility-Administered Medications  Medication Dose Route Frequency Provider Last Rate Last Dose  . amiodarone (PACERONE) tablet 200 mg  200 mg Oral Daily Cristal Ford, MD   200 mg at 04/21/11 1055  . aspirin chewable tablet 81 mg  81 mg Oral Daily Cristal Ford, MD   81 mg at 04/21/11 1055  . azithromycin (ZITHROMAX) 500 mg in dextrose 5 % 250 mL IVPB  500 mg Intravenous Q24H Delorice Bannister, MD   500 mg at 04/21/11 1055  . budesonide-formoterol (SYMBICORT) 160-4.5 MCG/ACT inhaler 2 puff  2 puff Inhalation BID Cristal Ford, MD   2 puff at 04/21/11 0837  . cefTRIAXone (ROCEPHIN) 1 g in dextrose 5 % 50 mL IVPB  1 g Intravenous Q24H Berkley Harvey, PHARMD   1 g at 04/21/11 1303  . colchicine tablet 0.6 mg  0.6 mg Oral BID PRN Cristal Ford, MD      . diphenhydrAMINE (BENADRYL) tablet 25 mg  25 mg Oral Q6H PRN Cristal Ford, MD      . docusate sodium (COLACE) capsule 100 mg  100 mg Oral BID PRN Cristal Ford, MD      . furosemide (LASIX) tablet 40 mg  40 mg Oral Daily Gwenyth Bender, NP   40 mg at 04/21/11 1055  . levalbuterol (XOPENEX HFA) inhaler 2 puff  2 puff Inhalation TID Conley Canal, MD   2 puff at 04/21/11 1450  . metoprolol tartrate (LOPRESSOR) tablet 50 mg  50 mg Oral BID Cristal Ford, MD   50 mg at 04/21/11 1055    . pantoprazole (PROTONIX) EC tablet 40 mg  40 mg Oral Daily Cristal Ford, MD   40 mg at 04/21/11 1055  . predniSONE (DELTASONE) tablet 40 mg  40 mg Oral Q breakfast Christan Defranco, MD      . senna (SENOKOT) tablet 8.6 mg  1 tablet Oral Daily PRN Cristal Ford, MD      . sertraline (ZOLOFT) tablet 12.5 mg  12.5 mg Oral Daily Cristal Ford, MD   12.5 mg at 04/21/11 1054  . sodium chloride 0.9 % injection 3 mL  3 mL Intravenous Q12H Cristal Ford, MD   3 mL at 04/21/11 1055  . sodium chloride 0.9 % injection 3 mL  3 mL Intravenous PRN Cristal Ford, MD      . traMADol Janean Sark) tablet 25 mg  25 mg Oral Q6H PRN Mattix Imhof, MD      . zolpidem (AMBIEN) tablet 5 mg  5 mg Oral QHS PRN Cristal Ford, MD   5 mg at 04/19/11 2345  . DISCONTD: 0.9 %  sodium chloride infusion  250 mL Intravenous PRN Gwenyth Bender, NP      . DISCONTD: levalbuterol [facility] HFA) inhaler 1-2 puff  1-2 puff Inhalation Q8H Gwenyth Bender, NP   2 puff at 04/20/11 2137  . DISCONTD: meloxicam (MOBIC) tablet 7.5 mg  7.5 mg Oral Daily PRN Cristal Ford, MD      . DISCONTD: predniSONE (DELTASONE) 60 mg  60 mg Oral Q breakfast Gwenyth Bender, NP   60 mg at 04/21/11 0800   Allergies  Allergen Reactions  . Penicillins    Principal Problem:  *Acute-on-chronic respiratory failure Active Problems:  HYPERLIPIDEMIA  HYPERTENSION  CAD  AORTIC STENOSIS  ATRIAL FIBRILLATION, PAROXYSMAL  COPD  Chronic kidney disease (CKD), stage IV (severe)  OSTEOARTHRITIS  ARTHRITIS  LUNG CANCER, HX OF  BENIGN PROSTATIC HYPERTROPHY, HX OF  Long term current use of anticoagulant  Pacemaker  Community acquired pneumonia  Leukocytosis   Vital signs in last 24 hours: Temp:  [97.8 F (36.6 C)-98.1 F (36.7 C)] 98.1 F (36.7 C) (01/17 1411) Pulse Rate:  [77-78] 78  (01/17 1411) Resp:  [18-20] 20  (01/17 1411) BP: (114-121)/(63-71) 114/63 mmHg (01/17 1411) SpO2:  [92 %-96 %] 92 % (01/17 1411) Weight:  [83.6 kg (184 lb 4.9 oz)] 83.6 kg  (184 lb 4.9 oz) (01/17 0635) Weight change: -1.2 kg (-2 lb 10.3 oz) Last BM Date: 04/20/11  Intake/Output from previous day: 01/16 0701 - 01/17 0700 In: 900 [P.O.:600; IV Piggyback:300] Out: 300 [Urine:300] Intake/Output this shift: Total I/O In: 240 [P.O.:240] Out: 200 [Urine:200]  Lab Results:  Basename 04/21/11 0500 04/20/11 0525  WBC 8.1 9.9  HGB 12.0* 11.7*  HCT 37.5* 36.6*  PLT 192 171   BMET  Basename 04/21/11 0500 04/20/11 0525  NA 138 138  K 4.3 4.2  CL 99 102  CO2 30 27  GLUCOSE 130* 101*  BUN 38* 27*  CREATININE 2.37* 2.13*  CALCIUM 9.3 8.9    Studies/Results: Ct Chest Wo Contrast  04/21/2011  *RADIOLOGY REPORT*  Clinical Data: Short of breath with cough.  Evaluate for pneumonia.  CT CHEST WITHOUT CONTRAST  Technique:  Multidetector CT imaging of the chest was performed following the standard protocol without IV contrast.  Comparison: Plain film of 04/19/2011.  Most recent CT of 10/22/2009.  Findings: Lung windows demonstrate mild thickening of the right side of the trachea at the level of the thoracic inlet.  Example image 11.  This is felt to be similar to on the prior CT.  Mild degradation secondary to pacer battery artifact and patient arm position.  Moderate centrilobular and paraseptal emphysema. Patchy right upper lobe airspace and ground-glass opacity, including on image 15 of series 6.  Portions of this are somewhat nodular.  Example 1.0 cm on image 10.   Probable scarring in the left apex, including on image 14. Patchy opacities within the lingula and left lower lobe, including on image 33.  These are new.  Mixed ground-glass and airspace.  Soft tissue windows demonstrate a 1.2 cm left thyroid nodule which is nonspecific. Prior median sternotomy.  Moderate cardiomegaly, without pericardial or pleural effusion.  Enlargement of pulmonary outflow tract at 4.6 cm.  A 1.4 cm right paratracheal node is newly enlarged since the prior. 1.5 cm subcarinal node on image  26 is also new.  Limited evaluation for hilar adenopathy secondary technique. 1.4 cm para esophageal node on image 41 measures 1.2 cm on the prior.  Limited abdominal imaging demonstrates  probable cyst in hepatic dome on image 37.  Atrophic bilateral kidneys.  Low density right renal lesions.  Likely cysts.  A central left renal upper pole lesion measures 34 HU and 4.6 cm.  There is also hyperattenuating sub centimeter upper pole left renal lesion on image 58.  Left renal cyst as well.  Diffuse idiopathic skeletal hyperostosis involving the thoracic spine.  IMPRESSION:  1.  Mildly degraded exam secondary pacer artifact and patient arm position. 2.  Emphysema with patchy areas of airspace and ground-glass opacity.  Favored to represent infection.  The right upper lobe area has some more nodular components.  Recommend at minimum radiographic follow-up to confirm resolution.  CT follow-up at 3 months could also be performed. 3.  Thoracic adenopathy, likely reactive.  This could also be reevaluated at CT follow-up at 3 months. 4. Pulmonary artery enlargement suggests pulmonary arterial hypertension. 5.  Indeterminate left-sided renal masses.  If this 78 year old patient is a treatment candidate, further characterization with ultrasound versus pre and postcontrast CT - MRI should be considered.  Original Report Authenticated By: Consuello Bossier, M.D.    Medications: I have reviewed the patient's current medications.   Physical exam GENERAL- alert HEAD- normal atraumatic, no neck masses, normal thyroid, no jvd RESPIRATORY- appears well, vitals normal, no respiratory distress, acyanotic, normal RR, ear and throat exam is normal, neck free of mass or lymphadenopathy, chest clear, no wheezing, crepitations, rhonchi, normal symmetric air entry CVS- regular rate and rhythm, S1, S2 normal, no murmur, click, rub or gallop ABDOMEN- abdomen is soft without significant tenderness, masses, organomegaly or guarding NEURO-  Grossly normal EXTREMITIES- extremities normal, atraumatic, no cyanosis or edema  Plan 1. Acute on chronic respiratory failure secondary to community acquired pneumonia- better. Wbc now normal. Afebrile. Continue abx to complete 7 days. Follow respiratory cultures. Steroid taper. Needs follow up imaging after completion of abx course. 2. afib on coumadin- inr supratherapeutic. No evidence of bleeding. pahrmacy appreciated. Target inr 2-3. Currently in sinus. 3. CKD stage 4- slight bump in creatinine. D/c meloxicum. Scale back on ivf. 4. CAD/hyperlipidemia/htn- stable.   Basha Krygier 04/21/2011 4:25 PM Pager: [phone].

## 2011-04-22 LAB — PROTIME-INR: INR: 3.27 — ABNORMAL HIGH (ref 0.00–1.49)

## 2011-04-22 LAB — BASIC METABOLIC PANEL
CO2: 30 mEq/L (ref 19–32)
Chloride: 100 mEq/L (ref 96–112)
Creatinine, Ser: 2.05 mg/dL — ABNORMAL HIGH (ref 0.50–1.35)
GFR calc Af Amer: 32 mL/min — ABNORMAL LOW (ref >90)
Potassium: 3.9 mEq/L (ref 3.5–5.1)

## 2011-04-22 LAB — CBC
HCT: 39.2 % (ref 39.0–52.0)
MCV: 92.9 fL (ref 78.0–100.0)
Platelets: 232 10*3/uL (ref 150–400)
RBC: 4.22 MIL/uL (ref 4.22–5.81)
RDW: 16.7 % — ABNORMAL HIGH (ref 11.5–15.5)
WBC: 10.1 10*3/uL (ref 4.0–10.5)

## 2011-04-22 MED ORDER — LEVALBUTEROL HCL 1.25 MG/0.5ML IN NEBU
1.2500 mg | INHALATION_SOLUTION | Freq: Every day | RESPIRATORY_TRACT | Status: DC | PRN
  Administered 2011-04-22: 1.25 mg via RESPIRATORY_TRACT
  Filled 2011-04-22 (×2): qty 0.5

## 2011-04-22 MED ORDER — PREDNISONE 20 MG PO TABS
20.0000 mg | ORAL_TABLET | Freq: Once | ORAL | Status: AC
  Administered 2011-04-22: 20 mg via ORAL
  Filled 2011-04-22 (×2): qty 1

## 2011-04-22 NOTE — Progress Notes (Signed)
Talked to patient with wife and daughter present about dcp - plans to return home at discharge with family members; Home health care list given to wife, spouse chose [facility]; Norberta Keens RN with [facility] called for arrangements. [facility] is to discuss insurance coverage of HHC with the spouse; B. Lobbyist, BSN, [place]

## 2011-04-22 NOTE — Progress Notes (Signed)
ANTICOAGULATION CONSULT NOTE   Pharmacy Consult for Coumadin Indication: atrial fibrillation  Allergies  Allergen Reactions  . Penicillins     Patient Measurements: Height: 5\' 11"  (180.3 cm) Weight: 182 lb 12.2 oz (82.9 kg) IBW/kg (Calculated) : 75.3     Vital Signs: Temp: 98.7 F (37.1 C) (01/18 0613) Temp src: Oral (01/18 0613) BP: 157/68 mmHg (01/18 0613) Pulse Rate: 79  (01/18 0613)  Labs:  Basename 04/22/11 0454 04/21/11 0500 04/20/11 0730 04/20/11 0525 04/20/11 0144 04/19/11 1940  HGB 12.5* 12.0* -- -- -- --  HCT 39.2 37.5* -- 36.6* -- --  PLT 232 192 -- 171 -- --  APTT -- -- -- -- -- --  LABPROT 33.8* 37.9* -- 34.6* -- --  INR 3.27* 3.78* -- 3.37* -- --  HEPARINUNFRC -- -- -- -- -- --  CREATININE 2.05* 2.37* -- 2.13* -- --  CKTOTAL -- -- 44 -- 51 48  CKMB -- -- 2.6 -- 2.5 2.4  TROPONINI -- -- <0.30 -- <0.30 <0.30   Estimated Creatinine Clearance: 26.5 ml/min (by C-G formula based on Cr of 2.05).  Medical History: Past Medical History  Diagnosis Date  . Cancer     Lung  . COPD (chronic obstructive pulmonary disease)   . Diverticulosis of colon   . GERD (gastroesophageal reflux disease)     Stricture  . Hyperlipidemia   . Hypertension   . Esophageal dysmotility   . Osteoarthritis   . Aortic stenosis     s/p AVR CABG  . Mild renal insufficiency   . Erectile dysfunction   . Venous insufficiency   . Gout   . BPH (benign prostatic hypertrophy)   . Atrioventricular block, complete     cx cabg/AVR  . Pacemaker     st judes    Medications:  Scheduled:     . amiodarone  200 mg Oral Daily  . aspirin  81 mg Oral Daily  . azithromycin  500 mg Intravenous Q24H  . budesonide-formoterol  2 puff Inhalation BID  . cefTRIAXone (ROCEPHIN)  IV  1 g Intravenous Q24H  . furosemide  40 mg Oral Daily  . levalbuterol  2 puff Inhalation TID  . metoprolol  50 mg Oral BID  . pantoprazole  40 mg Oral Daily  . predniSONE  40 mg Oral Q breakfast  . sertraline   12.5 mg Oral Daily  . sodium chloride  3 mL Intravenous Q12H  . DISCONTD: predniSONE  60 mg Oral Q breakfast   Assessment:  78 year old male on Coumadin PTA for history of atrial fibrillation admit with pneumonia.  Coumadin home regimen = 3.75mg  daily except for 2.5mg  on Thursdays.   Noted drug interaction with amiodarone (continued from home)   INR supratherapeutic today despite holding doses, but is decreased today  No bleeding reported  Goal of Therapy:  INR 2-3   Plan:  1.  No warfarin today 2. Follow daily PT/INR  Lynann Beaver PharmD   Pager [phone] 04/22/2011 7:47 AM

## 2011-04-22 NOTE — Progress Notes (Signed)
SUBJECTIVE Feels short of breath but is generally better.   1. Right upper lobe pneumonia   2. CAP (community acquired pneumonia)     Past Medical History  Diagnosis Date  . Cancer     Lung  . COPD (chronic obstructive pulmonary disease)   . Diverticulosis of colon   . GERD (gastroesophageal reflux disease)     Stricture  . Hyperlipidemia   . Hypertension   . Esophageal dysmotility   . Osteoarthritis   . Aortic stenosis     s/p AVR CABG  . Mild renal insufficiency   . Erectile dysfunction   . Venous insufficiency   . Gout   . BPH (benign prostatic hypertrophy)   . Atrioventricular block, complete     cx cabg/AVR  . Pacemaker     st judes   Current Facility-Administered Medications  Medication Dose Route Frequency Provider Last Rate Last Dose  . amiodarone (PACERONE) tablet 200 mg  200 mg Oral Daily Cristal Ford, MD   200 mg at 04/22/11 0949  . aspirin chewable tablet 81 mg  81 mg Oral Daily Cristal Ford, MD   81 mg at 04/22/11 0949  . azithromycin (ZITHROMAX) 500 mg in dextrose 5 % 250 mL IVPB  500 mg Intravenous Q24H Akeen Ledyard, MD   500 mg at 04/22/11 1030  . budesonide-formoterol (SYMBICORT) 160-4.5 MCG/ACT inhaler 2 puff  2 puff Inhalation BID Cristal Ford, MD   2 puff at 04/22/11 1936  . cefTRIAXone (ROCEPHIN) 1 g in dextrose 5 % 50 mL IVPB  1 g Intravenous Q24H Berkley Harvey, PHARMD   1 g at 04/22/11 1156  . colchicine tablet 0.6 mg  0.6 mg Oral BID PRN Cristal Ford, MD      . diphenhydrAMINE (BENADRYL) tablet 25 mg  25 mg Oral Q6H PRN Cristal Ford, MD      . docusate sodium (COLACE) capsule 100 mg  100 mg Oral BID PRN Cristal Ford, MD      . furosemide (LASIX) tablet 40 mg  40 mg Oral Daily Gwenyth Bender, NP   40 mg at 04/22/11 0948  . levalbuterol (XOPENEX) nebulizer solution 1.25 mg  1.25 mg Nebulization 5 X Daily PRN Deva Ron, MD   1.25 mg at 04/22/11 1935  . metoprolol tartrate (LOPRESSOR) tablet 50 mg  50 mg Oral BID Cristal Ford, MD   50 mg at 04/22/11 2052  . pantoprazole (PROTONIX) EC tablet 40 mg  40 mg Oral Daily Cristal Ford, MD   40 mg at 04/22/11 0948  . predniSONE (DELTASONE) tablet 20 mg  20 mg Oral Once Raeonna Milo, MD   20 mg at 04/22/11 2053  . predniSONE (DELTASONE) tablet 40 mg  40 mg Oral Q breakfast Waris Rodger, MD   40 mg at 04/22/11 0904  . senna (SENOKOT) tablet 8.6 mg  1 tablet Oral Daily PRN Cristal Ford, MD      . sertraline (ZOLOFT) tablet 12.5 mg  12.5 mg Oral Daily Cristal Ford, MD   12.5 mg at 04/22/11 0948  . sodium chloride 0.9 % injection 3 mL  3 mL Intravenous Q12H Cristal Ford, MD   3 mL at 04/22/11 2053  . sodium chloride 0.9 % injection 3 mL  3 mL Intravenous PRN Cristal Ford, MD      . traMADol Janean Sark) tablet 25 mg  25 mg Oral Q6H PRN Conley Canal, MD      .  zolpidem (AMBIEN) tablet 5 mg  5 mg Oral QHS PRN Cristal Ford, MD   5 mg at 04/21/11 2133  . DISCONTD: levalbuterol (XOPENEX HFA) inhaler 2 puff  2 puff Inhalation TID Conley Canal, MD   2 puff at 04/22/11 0858   Allergies  Allergen Reactions  . Penicillins    Principal Problem:  *Acute-on-chronic respiratory failure Active Problems:  HYPERLIPIDEMIA  HYPERTENSION  CAD  AORTIC STENOSIS  ATRIAL FIBRILLATION, PAROXYSMAL  COPD  Chronic kidney disease (CKD), stage IV (severe)  OSTEOARTHRITIS  ARTHRITIS  LUNG CANCER, HX OF  BENIGN PROSTATIC HYPERTROPHY, HX OF  Long term current use of anticoagulant  Pacemaker  Community acquired pneumonia  Leukocytosis   Vital signs in last 24 hours: Temp:  [98.4 F (36.9 C)-98.7 F (37.1 C)] 98.5 F (36.9 C) (01/18 1349) Pulse Rate:  [71-81] 71  (01/18 2052) Resp:  [20-22] 22  (01/18 1349) BP: (125-157)/(63-74) 136/63 mmHg (01/18 2052) SpO2:  [91 %-94 %] 92 % (01/18 1937) FiO2 (%):  [88 %] 88 % (01/18 0857) Weight:  [82.9 kg (182 lb 12.2 oz)] 82.9 kg (182 lb 12.2 oz) (01/18 0960) Weight change: -0.7 kg (-1 lb 8.7 oz) Last BM Date:  04/20/11  Intake/Output from previous day: 01/17 0701 - 01/18 0700 In: 480 [P.O.:480] Out: 675 [Urine:675] Intake/Output this shift: Total I/O In: 3 [I.V.:3] Out: -   Lab Results:  Basename 04/22/11 0454 04/21/11 0500  WBC 10.1 8.1  HGB 12.5* 12.0*  HCT 39.2 37.5*  PLT 232 192   BMET  Basename 04/22/11 0454 04/21/11 0500  NA 137 138  K 3.9 4.3  CL 100 99  CO2 30 30  GLUCOSE 97 130*  BUN 37* 38*  CREATININE 2.05* 2.37*  CALCIUM 9.4 9.3    Studies/Results: Ct Chest Wo Contrast  04/21/2011  *RADIOLOGY REPORT*  Clinical Data: Short of breath with cough.  Evaluate for pneumonia.  CT CHEST WITHOUT CONTRAST  Technique:  Multidetector CT imaging of the chest was performed following the standard protocol without IV contrast.  Comparison: Plain film of 04/19/2011.  Most recent CT of 10/22/2009.  Findings: Lung windows demonstrate mild thickening of the right side of the trachea at the level of the thoracic inlet.  Example image 11.  This is felt to be similar to on the prior CT.  Mild degradation secondary to pacer battery artifact and patient arm position.  Moderate centrilobular and paraseptal emphysema. Patchy right upper lobe airspace and ground-glass opacity, including on image 15 of series 6.  Portions of this are somewhat nodular.  Example 1.0 cm on image 10.   Probable scarring in the left apex, including on image 14. Patchy opacities within the lingula and left lower lobe, including on image 33.  These are new.  Mixed ground-glass and airspace.  Soft tissue windows demonstrate a 1.2 cm left thyroid nodule which is nonspecific. Prior median sternotomy.  Moderate cardiomegaly, without pericardial or pleural effusion.  Enlargement of pulmonary outflow tract at 4.6 cm.  A 1.4 cm right paratracheal node is newly enlarged since the prior. 1.5 cm subcarinal node on image 26 is also new.  Limited evaluation for hilar adenopathy secondary technique. 1.4 cm para esophageal node on image 41  measures 1.2 cm on the prior.  Limited abdominal imaging demonstrates probable cyst in hepatic dome on image 37.  Atrophic bilateral kidneys.  Low density right renal lesions.  Likely cysts.  A central left renal upper pole lesion measures 34 HU and 4.6  cm.  There is also hyperattenuating sub centimeter upper pole left renal lesion on image 58.  Left renal cyst as well.  Diffuse idiopathic skeletal hyperostosis involving the thoracic spine.  IMPRESSION:  1.  Mildly degraded exam secondary pacer artifact and patient arm position. 2.  Emphysema with patchy areas of airspace and ground-glass opacity.  Favored to represent infection.  The right upper lobe area has some more nodular components.  Recommend at minimum radiographic follow-up to confirm resolution.  CT follow-up at 3 months could also be performed. 3.  Thoracic adenopathy, likely reactive.  This could also be reevaluated at CT follow-up at 3 months. 4. Pulmonary artery enlargement suggests pulmonary arterial hypertension. 5.  Indeterminate left-sided renal masses.  If this 78 year old patient is a treatment candidate, further characterization with ultrasound versus pre and postcontrast CT - MRI should be considered.  Original Report Authenticated By: Consuello Bossier, M.D.    Medications: I have reviewed the patient's current medications.   Physical exam GENERAL- alert HEAD- normal atraumatic, no neck masses, normal thyroid, no jvd RESPIRATORY- appears well, vitals normal, no respiratory distress, acyanotic, normal RR, ear and throat exam is normal, neck free of mass or lymphadenopathy, chest clear, no wheezing, crepitations, rhonchi, normal symmetric air entry CVS- regular rate and rhythm, S1, S2 normal, no murmur, click, rub or gallop ABDOMEN- abdomen is soft without significant tenderness, masses, organomegaly or guarding NEURO- Grossly normal EXTREMITIES- extremities normal, atraumatic, no cyanosis or edema  Plan  1. Acute on chronic  respiratory failure secondary to community acquired pneumonia- better. Wbc now normal. Afebrile. Continue abx to complete 7 days.  2. afib on coumadin- inr supratherapeutic. No evidence of bleeding. pharmacy appreciated. Target inr 2-3. Currently in sinus.  3. CKD stage 4- stable. 4. CAD/hyperlipidemia/htn- stable.        Adam [name] 04/22/2011 8:56 PM Pager: [phone].

## 2011-04-22 NOTE — Progress Notes (Signed)
Physical Therapy Treatment Patient Details Name: Adam [name] MRN: [id number] DOB: [birth year] Today's Date: 04/22/2011  PT Assessment/Plan  PT - Assessment/Plan Comments on Treatment Session: Pt cooperative but needs education on correct breathing technques.  Pt tends to be a mouth breather.  pt had to have constant cues to breath through his nose and not not talk while walking.  On 4 L O2 with max cues for breathing pts O2 sats after 150 feet was 825 and took 3 minutes of concentrateed breathing to bring it back up to 92%. PT Plan: Discharge plan remains appropriate PT Frequency: Min 3X/week Follow Up Recommendations: Home health PT;Supervision/Assistance - 24 hour Equipment Recommended: None recommended by PT PT Goals  Acute Rehab PT Goals PT Goal: Sit to Stand - Progress: Progressing toward goal PT Goal: Stand to Sit - Progress: Progressing toward goal PT Goal: Stand - Progress: Progressing toward goal PT Goal: Ambulate - Progress: Progressing toward goal  PT Treatment Precautions/Restrictions  Precautions Precautions: Fall Restrictions Weight Bearing Restrictions: No Mobility (including Balance) Transfers Sit to Stand: 5: Supervision;From chair/3-in-1;With upper extremity assist Ambulation/Gait Ambulation/Gait: Yes Ambulation/Gait Assistance: 4: Min assist Ambulation/Gait Assistance Details (indicate cue type and reason): Cues to stand tall and slow down.  Pt also cued for breathing through his nose and not talking while walking.  pt had to be cued entire walk to breath through his nose.  it doesnt come naturally.  Pt stopped 4 times to do deep breathing while walking and O2 sats still 82% after walking Ambulation Distance (Feet): 150 Feet Assistive device: Rolling walker Gait Pattern: Trunk flexed    Exercise    End of Session PT - End of Session Activity Tolerance: Treatment limited secondary to medical complications (Comment) (O2 down to 82% on 4L after walking.   Education given) General Behavior During Session: [facility] for tasks performed Cognition: [facility] for tasks performed  Judson Roch 04/22/2011, 3:46 PM

## 2011-04-22 NOTE — Progress Notes (Signed)
O2 sats 88% on 3l. Resp Therapy increased to 4l O'Brien with o2 SATS 92%

## 2011-04-23 LAB — PROTIME-INR
INR: 3.34 — ABNORMAL HIGH (ref 0.00–1.49)
Prothrombin Time: 34.4 seconds — ABNORMAL HIGH (ref 11.6–15.2)

## 2011-04-23 MED ORDER — MOXIFLOXACIN HCL 400 MG PO TABS
400.0000 mg | ORAL_TABLET | Freq: Every day | ORAL | Status: DC
  Administered 2011-04-23 – 2011-04-26 (×4): 400 mg via ORAL
  Filled 2011-04-23 (×5): qty 1

## 2011-04-23 NOTE — Progress Notes (Signed)
ANTICOAGULATION CONSULT NOTE - Follow Up Consult  Pharmacy Consult for Warfarin Indication: atrial fibrillation  Allergies  Allergen Reactions  . Penicillins     Patient Measurements: Height: 5\' 11"  (180.3 cm) Weight: 181 lb 14.1 oz (82.5 kg) (standing scale B) IBW/kg (Calculated) : 75.3    Vital Signs: Temp: 98.5 F (36.9 C) (01/19 0545) Temp src: Oral (01/19 0545) BP: 122/68 mmHg (01/19 0545) Pulse Rate: 77  (01/19 0545)  Labs:  Basename 04/23/11 0524 04/22/11 0454 04/21/11 0500  HGB -- 12.5* 12.0*  HCT -- 39.2 37.5*  PLT -- 232 192  APTT -- -- --  LABPROT 34.4* 33.8* 37.9*  INR 3.34* 3.27* 3.78*  HEPARINUNFRC -- -- --  CREATININE -- 2.05* 2.37*  CKTOTAL -- -- --  CKMB -- -- --  TROPONINI -- -- --   Estimated Creatinine Clearance: 26.5 ml/min (by C-G formula based on Cr of 2.05).   Medications:  Scheduled:    . amiodarone  200 mg Oral Daily  . aspirin  81 mg Oral Daily  . azithromycin  500 mg Intravenous Q24H  . budesonide-formoterol  2 puff Inhalation BID  . cefTRIAXone (ROCEPHIN)  IV  1 g Intravenous Q24H  . furosemide  40 mg Oral Daily  . metoprolol  50 mg Oral BID  . pantoprazole  40 mg Oral Daily  . predniSONE  20 mg Oral Once  . predniSONE  40 mg Oral Q breakfast  . sertraline  12.5 mg Oral Daily  . sodium chloride  3 mL Intravenous Q12H  . DISCONTD: levalbuterol  2 puff Inhalation TID   Infusions:   PRN: colchicine, diphenhydrAMINE, docusate sodium, levalbuterol, senna, sodium chloride, traMADol, zolpidem  Assessment:  78 year old male on Coumadin PTA for history of atrial fibrillation admit with pneumonia  INR remains high despite last coumadin dose on 1/15 PTA.   Home dose: 3.75 mg Daily except for 2.5 mg on Thursdays  Drug interaction with amiodarone noted, medication was continued from home regimen  No bleeding reported  Goal of Therapy:  INR 2-3   Plan:  1.) No coumadin tonight 2.) Follow up AM INR  Yvana Samonte, Loma Messing PharmD 8:12 AM 04/23/2011

## 2011-04-23 NOTE — Progress Notes (Addendum)
SUBJECTIVE Feels better. Less dyspneic. Wants to ambulate.   1. Right upper lobe pneumonia   2. CAP (community acquired pneumonia)     Past Medical History  Diagnosis Date  . Cancer     Lung  . COPD (chronic obstructive pulmonary disease)   . Diverticulosis of colon   . GERD (gastroesophageal reflux disease)     Stricture  . Hyperlipidemia   . Hypertension   . Esophageal dysmotility   . Osteoarthritis   . Aortic stenosis     s/p AVR CABG  . Mild renal insufficiency   . Erectile dysfunction   . Venous insufficiency   . Gout   . BPH (benign prostatic hypertrophy)   . Atrioventricular block, complete     cx cabg/AVR  . Pacemaker     st judes   Current Facility-Administered Medications  Medication Dose Route Frequency Provider Last Rate Last Dose  . amiodarone (PACERONE) tablet 200 mg  200 mg Oral Daily Cristal Ford, MD   200 mg at 04/23/11 0930  . aspirin chewable tablet 81 mg  81 mg Oral Daily Cristal Ford, MD   81 mg at 04/23/11 0930  . budesonide-formoterol (SYMBICORT) 160-4.5 MCG/ACT inhaler 2 puff  2 puff Inhalation BID Cristal Ford, MD   2 puff at 04/23/11 0743  . colchicine tablet 0.6 mg  0.6 mg Oral BID PRN Cristal Ford, MD      . diphenhydrAMINE (BENADRYL) tablet 25 mg  25 mg Oral Q6H PRN Cristal Ford, MD      . docusate sodium (COLACE) capsule 100 mg  100 mg Oral BID PRN Cristal Ford, MD      . furosemide (LASIX) tablet 40 mg  40 mg Oral Daily Lesle Chris Black, NP   40 mg at 04/23/11 0930  . levalbuterol (XOPENEX) nebulizer solution 1.25 mg  1.25 mg Nebulization 5 X Daily PRN Willet Schleifer, MD   1.25 mg at 04/22/11 1935  . metoprolol tartrate (LOPRESSOR) tablet 50 mg  50 mg Oral BID Cristal Ford, MD   50 mg at 04/23/11 0932  . moxifloxacin (AVELOX) tablet 400 mg  400 mg Oral q1800 Koryn Charlot, MD      . pantoprazole (PROTONIX) EC tablet 40 mg  40 mg Oral Daily Cristal Ford, MD   40 mg at 04/23/11 0930  . predniSONE (DELTASONE) tablet 20 mg  20 mg  Oral Once Korissa Horsford, MD   20 mg at 04/22/11 2053  . predniSONE (DELTASONE) tablet 40 mg  40 mg Oral Q breakfast Laterria Lasota, MD   40 mg at 04/23/11 0759  . senna (SENOKOT) tablet 8.6 mg  1 tablet Oral Daily PRN Cristal Ford, MD      . sertraline (ZOLOFT) tablet 12.5 mg  12.5 mg Oral Daily Cristal Ford, MD   12.5 mg at 04/23/11 0930  . sodium chloride 0.9 % injection 3 mL  3 mL Intravenous Q12H Cristal Ford, MD   3 mL at 04/23/11 0930  . sodium chloride 0.9 % injection 3 mL  3 mL Intravenous PRN Cristal Ford, MD      . traMADol Janean Sark) tablet 25 mg  25 mg Oral Q6H PRN Tracer Gutridge, MD      . zolpidem (AMBIEN) tablet 5 mg  5 mg Oral QHS PRN Cristal Ford, MD   5 mg at 04/21/11 2133  . DISCONTD: cefTRIAXone (ROCEPHIN) 1 g in dextrose 5 % 50 mL IVPB  1 g Intravenous Q24H Berkley Harvey, [place]   1 g at 04/23/11 1149   Allergies  Allergen Reactions  . Penicillins    Principal Problem:  *Acute-on-chronic respiratory failure Active Problems:  HYPERLIPIDEMIA  HYPERTENSION  CAD  AORTIC STENOSIS  ATRIAL FIBRILLATION, PAROXYSMAL  COPD  Chronic kidney disease (CKD), stage IV (severe)  OSTEOARTHRITIS  ARTHRITIS  LUNG CANCER, HX OF  BENIGN PROSTATIC HYPERTROPHY, HX OF  Long term current use of anticoagulant  Pacemaker  Community acquired pneumonia  Leukocytosis   Vital signs in last 24 hours: Temp:  [98.1 F (36.7 C)-98.5 F (36.9 C)] 98.4 F (36.9 C) (01/19 1324) Pulse Rate:  [71-77] 77  (01/19 1324) Resp:  [16-20] 16  (01/19 1324) BP: (122-142)/(63-68) 125/65 mmHg (01/19 1324) SpO2:  [92 %-96 %] 94 % (01/19 1324) Weight:  [82.5 kg (181 lb 14.1 oz)] 82.5 kg (181 lb 14.1 oz) (01/19 0500) Weight change: -0.4 kg (-14.1 oz) Last BM Date: 04/20/11  Intake/Output from previous day: 01/18 0701 - 01/19 0700 In: 173 [P.O.:170; I.V.:3] Out: 1 [Stool:1] Intake/Output this shift: Total I/O In: 600 [P.O.:600] Out: -   Lab Results:  Basename 04/22/11 0454  04/21/11 0500  WBC 10.1 8.1  HGB 12.5* 12.0*  HCT 39.2 37.5*  PLT 232 192   BMET  Basename 04/22/11 0454 04/21/11 0500  NA 137 138  K 3.9 4.3  CL 100 99  CO2 30 30  GLUCOSE 97 130*  BUN 37* 38*  CREATININE 2.05* 2.37*  CALCIUM 9.4 9.3    Studies/Results: No results found.  Medications: I have reviewed the patient's current medications.   Physical exam GENERAL- alert HEAD- normal atraumatic, no neck masses, normal thyroid, no jvd RESPIRATORY- bilateral air entry. Improved. No wheezing. CVS- regular rate and rhythm, S1, S2 normal, no murmur, click, rub or gallop ABDOMEN- abdomen is soft without significant tenderness, masses, organomegaly or guarding NEURO- Grossly normal EXTREMITIES- extremities normal, atraumatic, no cyanosis or edema  Plan  1. Acute on chronic respiratory failure secondary to community acquired pneumonia in setting of copd- better. Wbc  normal. Afebrile. Switch abx to oral avelox. 2. afib on coumadin- inr supratherapeutic. No evidence of bleeding. pharmacy appreciated. Target inr 2-3. Currently in sinus.  3. CKD stage 4- stable.  4. CAD/hyperlipidemia/htn- stable. Disposition- hopefully home soon.   Elevated BNP nonspecific due to ckd, although patient has aortic valve disease.       Tamyka Bezio 04/23/2011 2:33 PM Pager: [phone].

## 2011-04-24 LAB — BASIC METABOLIC PANEL
BUN: 45 mg/dL — ABNORMAL HIGH (ref 6–23)
Calcium: 8.9 mg/dL (ref 8.4–10.5)
Creatinine, Ser: 2.02 mg/dL — ABNORMAL HIGH (ref 0.50–1.35)
GFR calc Af Amer: 32 mL/min — ABNORMAL LOW (ref >90)
GFR calc non Af Amer: 28 mL/min — ABNORMAL LOW (ref >90)

## 2011-04-24 LAB — CBC
HCT: 36.8 % — ABNORMAL LOW (ref 39.0–52.0)
MCHC: 32.1 g/dL (ref 30.0–36.0)
MCV: 92.5 fL (ref 78.0–100.0)
RDW: 16.8 % — ABNORMAL HIGH (ref 11.5–15.5)

## 2011-04-24 MED ORDER — WARFARIN 0.5 MG HALF TABLET
0.5000 mg | ORAL_TABLET | Freq: Once | ORAL | Status: AC
  Administered 2011-04-24: 0.5 mg via ORAL
  Filled 2011-04-24: qty 1

## 2011-04-24 MED ORDER — FUROSEMIDE 20 MG PO TABS
20.0000 mg | ORAL_TABLET | Freq: Once | ORAL | Status: AC
  Administered 2011-04-24: 20 mg via ORAL
  Filled 2011-04-24: qty 1

## 2011-04-24 NOTE — Progress Notes (Signed)
SUBJECTIVE Adam [name] feels some what better. He ambulated in the hallway with assistance.   1. Right upper lobe pneumonia   2. CAP (community acquired pneumonia)     Past Medical History  Diagnosis Date  . Cancer     Lung  . COPD (chronic obstructive pulmonary disease)   . Diverticulosis of colon   . GERD (gastroesophageal reflux disease)     Stricture  . Hyperlipidemia   . Hypertension   . Esophageal dysmotility   . Osteoarthritis   . Aortic stenosis     s/p AVR CABG  . Mild renal insufficiency   . Erectile dysfunction   . Venous insufficiency   . Gout   . BPH (benign prostatic hypertrophy)   . Atrioventricular block, complete     cx cabg/AVR  . Pacemaker     st judes   Current Facility-Administered Medications  Medication Dose Route Frequency Provider Last Rate Last Dose  . amiodarone (PACERONE) tablet 200 mg  200 mg Oral Daily Cristal Ford, MD   200 mg at 04/24/11 0912  . aspirin chewable tablet 81 mg  81 mg Oral Daily Cristal Ford, MD   81 mg at 04/24/11 0913  . budesonide-formoterol (SYMBICORT) 160-4.5 MCG/ACT inhaler 2 puff  2 puff Inhalation BID Cristal Ford, MD   2 puff at 04/24/11 0855  . colchicine tablet 0.6 mg  0.6 mg Oral BID PRN Cristal Ford, MD      . diphenhydrAMINE (BENADRYL) tablet 25 mg  25 mg Oral Q6H PRN Cristal Ford, MD      . docusate sodium (COLACE) capsule 100 mg  100 mg Oral BID PRN Cristal Ford, MD      . furosemide (LASIX) tablet 20 mg  20 mg Oral Once Chanci Ojala, MD      . furosemide (LASIX) tablet 40 mg  40 mg Oral Daily Gwenyth Bender, NP   40 mg at 04/24/11 0913  . levalbuterol (XOPENEX) nebulizer solution 1.25 mg  1.25 mg Nebulization 5 X Daily PRN Nala Kachel, MD   1.25 mg at 04/22/11 1935  . metoprolol tartrate (LOPRESSOR) tablet 50 mg  50 mg Oral BID Cristal Ford, MD   50 mg at 04/24/11 0913  . moxifloxacin (AVELOX) tablet 400 mg  400 mg Oral q1800 Anderson Middlebrooks, MD   400 mg at 04/23/11 1732  . pantoprazole (PROTONIX)  EC tablet 40 mg  40 mg Oral Daily Cristal Ford, MD   40 mg at 04/24/11 0912  . predniSONE (DELTASONE) tablet 40 mg  40 mg Oral Q breakfast Lakoda Mcanany, MD   40 mg at 04/24/11 0913  . senna (SENOKOT) tablet 8.6 mg  1 tablet Oral Daily PRN Cristal Ford, MD      . sertraline (ZOLOFT) tablet 12.5 mg  12.5 mg Oral Daily Cristal Ford, MD   12.5 mg at 04/24/11 0912  . sodium chloride 0.9 % injection 3 mL  3 mL Intravenous Q12H Cristal Ford, MD   3 mL at 04/24/11 0913  . sodium chloride 0.9 % injection 3 mL  3 mL Intravenous PRN Cristal Ford, MD      . traMADol Janean Sark) tablet 25 mg  25 mg Oral Q6H PRN Fallyn Munnerlyn, MD      . warfarin (COUMADIN) tablet 0.5 mg  0.5 mg Oral ONCE-1800 Mary Patricia Borgerding, PHARMD      . zolpidem (AMBIEN) tablet 5 mg  5 mg Oral QHS  PRN Cristal Ford, MD   5 mg at 04/23/11 2232  . DISCONTD: cefTRIAXone (ROCEPHIN) 1 g in dextrose 5 % 50 mL IVPB  1 g Intravenous Q24H Berkley Harvey, PHARMD   1 g at 04/23/11 1149   Allergies  Allergen Reactions  . Penicillins    Principal Problem:  *Acute-on-chronic respiratory failure Active Problems:  HYPERLIPIDEMIA  HYPERTENSION  CAD  AORTIC STENOSIS  ATRIAL FIBRILLATION, PAROXYSMAL  COPD  Chronic kidney disease (CKD), stage IV (severe)  OSTEOARTHRITIS  ARTHRITIS  LUNG CANCER, HX OF  BENIGN PROSTATIC HYPERTROPHY, HX OF  Long term current use of anticoagulant  Pacemaker  Community acquired pneumonia  Leukocytosis   Vital signs in last 24 hours: Temp:  [97.8 F (36.6 C)] 97.8 F (36.6 C) (01/20 0500) Pulse Rate:  [76] 76  (01/20 0500) Resp:  [16] 16  (01/20 0500) BP: (136)/(71) 136/71 mmHg (01/20 0500) SpO2:  [90 %-94 %] 90 % (01/20 0859) Weight:  [82.6 kg (182 lb 1.6 oz)] 82.6 kg (182 lb 1.6 oz) (01/20 0500) Weight change: 0.1 kg (3.5 oz) Last BM Date: 04/23/11  Intake/Output from previous day: 01/19 0701 - 01/20 0700 In: 1183 [P.O.:1180; I.V.:3] Out: 675 [Urine:675] Intake/Output this  shift: Total I/O In: 600 [P.O.:600] Out: -   Lab Results:  Basename 04/24/11 0455 04/22/11 0454  WBC 8.7 10.1  HGB 11.8* 12.5*  HCT 36.8* 39.2  PLT 243 232   BMET  Basename 04/24/11 0455 04/22/11 0454  NA 139 137  K 4.2 3.9  CL 101 100  CO2 31 30  GLUCOSE 92 97  BUN 45* 37*  CREATININE 2.02* 2.05*  CALCIUM 8.9 9.4    Studies/Results: No results found.  Medications: I have reviewed the patient's current medications.   Physical exam GENERAL- alert, sitting up in chair. HEAD- normal atraumatic, no neck masses, normal thyroid, no jvd RESPIRATORY- appears well, vitals normal, no respiratory distress, acyanotic, normal RR, ear and throat exam is normal, neck free of mass or lymphadenopathy, chest clear, no wheezing, crepitations, rhonchi, normal symmetric air entry CVS- regular rate and rhythm, S1, S2 normal, no murmur, click, rub or gallop ABDOMEN- abdomen is soft without significant tenderness, masses, organomegaly or guarding NEURO- Grossly normal EXTREMITIES- extremities normal, atraumatic, no cyanosis or edema  Plan  1. Acute on chronic respiratory failure secondary to community acquired pneumonia in setting of copd- better. Wbc normal. Afebrile. To complete course of avelox.  2. afib on coumadin- inr now in therapeutic range. Appreciate pharmacy input. Target inr 2-3. Currently in sinus.  3. CKD stage 4- stable.  4. CAD/hyperlipidemia/htn- stable.  Disposition- hopefully home soon.   Elevated BNP nonspecific due to ckd, although patient has aortic valve disease- monitor on lasix.        Cariann Kinnamon 04/24/2011 1:45 PM Pager: [phone].

## 2011-04-24 NOTE — Plan of Care (Signed)
Problem: Phase III Progression Outcomes Goal: O2 sats > or equal to 93% on room air Outcome: Not Applicable Date Met:  04/24/11 Wears home 02

## 2011-04-24 NOTE — Progress Notes (Signed)
ANTICOAGULATION CONSULT NOTE - Follow Up Consult  Pharmacy Consult for Warfarin Indication: Atrial fibrillation  Allergies  Allergen Reactions  . Penicillins     Patient Measurements: Height: 5\' 11"  (180.3 cm) Weight: 182 lb 1.6 oz (82.6 kg) (standing scale) IBW/kg (Calculated) : 75.3    Vital Signs: Temp: 97.8 F (36.6 C) (01/20 0500) Temp src: Oral (01/20 0500) BP: 136/71 mmHg (01/20 0500) Pulse Rate: 76  (01/20 0500)  Labs:  Basename 04/24/11 0455 04/23/11 0524 04/22/11 0454  HGB 11.8* -- 12.5*  HCT 36.8* -- 39.2  PLT 243 -- 232  APTT -- -- --  LABPROT 30.1* 34.4* 33.8*  INR 2.82* 3.34* 3.27*  HEPARINUNFRC -- -- --  CREATININE 2.02* -- 2.05*  CKTOTAL -- -- --  CKMB -- -- --  TROPONINI -- -- --   Estimated Creatinine Clearance: 26.9 ml/min (by C-G formula based on Cr of 2.02).   Medications:  Scheduled:    . amiodarone  200 mg Oral Daily  . aspirin  81 mg Oral Daily  . budesonide-formoterol  2 puff Inhalation BID  . furosemide  40 mg Oral Daily  . metoprolol  50 mg Oral BID  . moxifloxacin  400 mg Oral q1800  . pantoprazole  40 mg Oral Daily  . predniSONE  40 mg Oral Q breakfast  . sertraline  12.5 mg Oral Daily  . sodium chloride  3 mL Intravenous Q12H  . DISCONTD: cefTRIAXone (ROCEPHIN)  IV  1 g Intravenous Q24H   Infusions:   PRN: colchicine, diphenhydrAMINE, docusate sodium, levalbuterol, senna, sodium chloride, traMADol, zolpidem  Assessment:  78 year old male on Coumadin PTA for history of atrial fibrillation admit with pneumonia  INR is now at goal after holding coumadin x 4 days (Last dose was 1/15 prior to admission).   Home dose: 3.75 mg Daily except for 2.5 mg on Thursdays   Drug interaction with amiodarone noted, medication was continued from home regimen   Drug interaction with avelox  - started 1/19 - can cause INR to increase  CBC stable, No bleeding reported  Given new drug interaction w/ avelox and sensitive response on home  regimen will dose very conservatively tonight and see how he responds.    Goal of Therapy:  INR 2-3   Plan:  1.) Coumadin 0.5 mg po x 1 tonight 2.) follow up am INR   Adalyne Lovick, Loma Messing PharmD 7:36 AM 04/24/2011

## 2011-04-24 NOTE — Progress Notes (Signed)
Occupational Therapy Treatment Patient Details Name: Adam [name] MRN: [id number] DOB: 10/17/1920 Today's Date: 04/24/2011  OT Assessment/Plan OT Assessment/Plan Comments on Treatment Session: excellent participation OT Plan: Discharge plan remains appropriate OT Frequency: Min 2X/week Follow Up Recommendations: Home health OT;Other (comment) Equipment Recommended: None recommended by OT OT Goals ADL Goals ADL Goal: Grooming - Progress: Progressing toward goals ADL Goal: Lower Body Bathing - Progress: Progressing toward goals ADL Goal: Lower Body Dressing - Progress: Progressing toward goals Miscellaneous OT Goals Miscellaneous OT Goal #1: Pt will initiate at least one rest break for energy conservation per session OT Goal: Miscellaneous Goal #1 - Progress: Progressing toward goals  OT Treatment Precautions/Restrictions  Precautions Precautions: Fall Restrictions Weight Bearing Restrictions: No   ADL ADL ADL Comments:  (states he has been participating with ADL staff. discussed E) Mobility  Bed Mobility Bed Mobility: No Transfers Transfers: Yes Sit to Stand: 5: Supervision;From chair/3-in-1;With upper extremity assist Exercises   Participated in BUE strengthening/endurance theraband exercises in sitting. Discussed pursed lip breathing whem exercising. Given therabnad to complete on own. End of Session OT - End of Session Equipment Utilized During Treatment: Gait belt Activity Tolerance: Patient limited by fatigue;Other (comment) (frequent O2Sat checks. Respiratory status limits aspects otf) Patient left: in chair;with call bell in reach;Other (comment) (chair alarm set) Nurse Communication: Mobility status for transfers General Behavior During Session: [facility] for tasks performed Cognition: [facility] for tasks performed  [facility]  04/24/2011, 3:01 PM [facility], OTR/L  [phone] 04/24/2011

## 2011-04-25 ENCOUNTER — Telehealth: Payer: Self-pay | Admitting: Cardiovascular Disease

## 2011-04-25 LAB — GRAM STAIN

## 2011-04-25 LAB — BASIC METABOLIC PANEL
BUN: 48 mg/dL — ABNORMAL HIGH (ref 6–23)
CO2: 31 mEq/L (ref 19–32)
Chloride: 99 mEq/L (ref 96–112)
Creatinine, Ser: 2.12 mg/dL — ABNORMAL HIGH (ref 0.50–1.35)

## 2011-04-25 LAB — PRO B NATRIURETIC PEPTIDE: Pro B Natriuretic peptide (BNP): 4024 pg/mL — ABNORMAL HIGH (ref 0–450)

## 2011-04-25 MED ORDER — PREDNISONE 20 MG PO TABS: 20.0000 mg | ORAL_TABLET | Freq: Every day | ORAL | Status: DC

## 2011-04-25 MED ORDER — FUROSEMIDE 20 MG PO TABS
20.0000 mg | ORAL_TABLET | Freq: Every day | ORAL | Status: AC
  Administered 2011-04-25: 20 mg via ORAL
  Filled 2011-04-25: qty 1

## 2011-04-25 MED ORDER — PREDNISONE 20 MG PO TABS
30.0000 mg | ORAL_TABLET | Freq: Every day | ORAL | Status: DC
  Administered 2011-04-26 – 2011-04-27 (×2): 30 mg via ORAL
  Filled 2011-04-25 (×2): qty 1

## 2011-04-25 MED ORDER — VANCOMYCIN HCL IN DEXTROSE 1-5 GM/200ML-% IV SOLN
1000.0000 mg | INTRAVENOUS | Status: DC
  Administered 2011-04-25 – 2011-04-27 (×3): 1000 mg via INTRAVENOUS
  Filled 2011-04-25 (×4): qty 200

## 2011-04-25 MED ORDER — WARFARIN SODIUM 1 MG PO TABS
1.0000 mg | ORAL_TABLET | Freq: Once | ORAL | Status: AC
  Administered 2011-04-25: 1 mg via ORAL
  Filled 2011-04-25: qty 1

## 2011-04-25 NOTE — Progress Notes (Signed)
ANTICOAGULATION CONSULT NOTE - Follow Up Consult  Pharmacy Consult for Warfarin Indication: Atrial fibrillation  Allergies  Allergen Reactions  . Penicillins     Patient Measurements: Height: 5\' 11"  (180.3 cm) Weight: 181 lb 3.5 oz (82.2 kg) IBW/kg (Calculated) : 75.3    Vital Signs: Temp: 97.8 F (36.6 C) (01/21 1429) Temp src: Oral (01/21 1429) BP: 113/58 mmHg (01/21 1429) Pulse Rate: 77  (01/21 1429)  Labs:  Basename 04/25/11 0402 04/24/11 0455 04/23/11 0524  HGB -- 11.8* --  HCT -- 36.8* --  PLT -- 243 --  APTT -- -- --  LABPROT 23.7* 30.1* 34.4*  INR 2.07* 2.82* 3.34*  HEPARINUNFRC -- -- --  CREATININE 2.12* 2.02* --  CKTOTAL -- -- --  CKMB -- -- --  TROPONINI -- -- --   Estimated Creatinine Clearance: 25.7 ml/min (by C-G formula based on Cr of 2.12).   Medications:  Scheduled:     . amiodarone  200 mg Oral Daily  . aspirin  81 mg Oral Daily  . budesonide-formoterol  2 puff Inhalation BID  . furosemide  20 mg Oral Once  . furosemide  40 mg Oral Daily  . metoprolol  50 mg Oral BID  . moxifloxacin  400 mg Oral q1800  . pantoprazole  40 mg Oral Daily  . predniSONE  20 mg Oral Q breakfast  . sertraline  12.5 mg Oral Daily  . sodium chloride  3 mL Intravenous Q12H  . warfarin  0.5 mg Oral ONCE-1800  . DISCONTD: predniSONE  40 mg Oral Q breakfast   Infusions:   PRN: colchicine, diphenhydrAMINE, docusate sodium, levalbuterol, senna, sodium chloride, traMADol, zolpidem  Assessment: 78 year old male on Coumadin prior to admission for history of atrial fibrillation admit with pneumonia . INR remains in goal range, but falling. Warfarin had been held for 4 days due to supratherapeutic INR. No bleeding reported in chart notes. Using conservative warfarin dosing given recent prolonged INR >3.  Home dose: 3.75 mg Daily except for 2.5 mg on Thursdays   Drug interaction with amiodarone noted, medication was continued from home regimen  Drug interaction with  avelox  - started 1/19 - can cause INR to increase.   Goal of Therapy:  INR 2-3   Plan:  1.) Coumadin 1mg  po x 1 tonight 2.) Follow up am INR   Annia Belt PharmD 2:49 PM 04/25/2011

## 2011-04-25 NOTE — Progress Notes (Signed)
ANTIBIOTIC CONSULT NOTE - INITIAL  Pharmacy Consult for Vancomycin Indication: Gram positive cocci on sputum gram stain  Allergies  Allergen Reactions  . Penicillins     Patient Measurements: Height: 5\' 11"  (180.3 cm) Weight: 181 lb 3.5 oz (82.2 kg) IBW/kg (Calculated) : 75.3   Vital Signs: Temp: 97.8 F (36.6 C) (01/21 1429) Temp src: Oral (01/21 1429) BP: 113/58 mmHg (01/21 1429) Pulse Rate: 77  (01/21 1429) Intake/Output from previous day: 01/20 0701 - 01/21 0700 In: 1563 [P.O.:1560; I.V.:3] Out: 800 [Urine:800] Intake/Output from this shift: Total I/O In: 800 [P.O.:800] Out: 1 [Stool:1]  Labs:  [facility] 04/25/11 0402 04/24/11 0455  WBC -- 8.7  HGB -- 11.8*  PLT -- 243  LABCREA -- --  CREATININE 2.12* 2.02*   Estimated Creatinine Clearance: 25.7 ml/min (by C-G formula based on Cr of 2.12).  Microbiology: Recent Results (from the past 720 hour(s))  CULTURE, SPUTUM-ASSESSMENT     Status: Normal   Collection Time   04/20/11  7:58 AM      Component Value Range Status Comment   Specimen Description SPUTUM   Final    Special Requests NONE   Final    Sputum evaluation     Final    Value: THIS SPECIMEN IS ACCEPTABLE. RESPIRATORY CULTURE REPORT TO FOLLOW.   Report Status 04/20/2011 FINAL   Final   CULTURE, RESPIRATORY     Status: Normal   Collection Time   04/20/11  7:58 AM      Component Value Range Status Comment   Specimen Description SPUTUM   Final    Special Requests NONE   Final    Gram Stain     Final    Value: FEW WBC PRESENT, PREDOMINANTLY PMN     NO SQUAMOUS EPITHELIAL CELLS SEEN     NO ORGANISMS SEEN   Culture NORMAL OROPHARYNGEAL FLORA   Final    Report Status 04/22/2011 FINAL   Final   GRAM STAIN     Status: Normal   Collection Time   04/25/11  2:53 PM      Component Value Range Status Comment   Specimen Description SPUTUM   Final    Special Requests NONE   Final    Gram Stain     Final    Value: MODERATE WBC PRESENT, PREDOMINANTLY PMN   FEW GRAM POSITIVE COCCI IN CHAINS IN PAIRS     BUDDING YEAST     Gram Stain Report Called to,Read Back By and Verified With: HARLESS,E. AT 1551 ON [id number] BY LOVE,T.   Report Status 04/25/2011 FINAL   Final     Medical History: Past Medical History  Diagnosis Date  . Cancer     Lung  . COPD (chronic obstructive pulmonary disease)   . Diverticulosis of colon   . GERD (gastroesophageal reflux disease)     Stricture  . Hyperlipidemia   . Hypertension   . Esophageal dysmotility   . Osteoarthritis   . Aortic stenosis     s/p AVR CABG  . Mild renal insufficiency   . Erectile dysfunction   . Venous insufficiency   . Gout   . BPH (benign prostatic hypertrophy)   . Atrioventricular block, complete     cx cabg/AVR  . Pacemaker     st judes    Medications:  Scheduled:    . amiodarone  200 mg Oral Daily  . aspirin  81 mg Oral Daily  . budesonide-formoterol  2 puff Inhalation BID  .  furosemide  20 mg Oral Daily  . furosemide  40 mg Oral Daily  . metoprolol  50 mg Oral BID  . moxifloxacin  400 mg Oral q1800  . pantoprazole  40 mg Oral Daily  . predniSONE  30 mg Oral Q breakfast  . sertraline  12.5 mg Oral Daily  . sodium chloride  3 mL Intravenous Q12H  . warfarin  0.5 mg Oral ONCE-1800  . warfarin  1 mg Oral ONCE-1800  . DISCONTD: predniSONE  20 mg Oral Q breakfast  . DISCONTD: predniSONE  40 mg Oral Q breakfast   Infusions:   Assessment: 89YOM on day #7 of total antibiotics (day #3 Avelox) for CAP in setting of COPD, now with sputum gram stain positive for gram positive cocci (culture pending).  Will add vancomycin for empiric treatment.  Patient is afebrile with normal WBC (also on steroids).  Estimated CrCl ~68ml/min.  Goal of Therapy:  Vancomycin trough level 15-20 mcg/ml  Plan:  Vancomcyin 1g IV q24h. F/u SCr and culture results.  Clance Boll 04/25/2011,4:00 PM

## 2011-04-25 NOTE — Telephone Encounter (Signed)
New Problem:    Patient has been admitted to [facility] with pneumonia and should be released tomorrow. Just wanted to let you know. If you have any questions please feel free to call.

## 2011-04-25 NOTE — Progress Notes (Signed)
Subjective: Reports that he is "darn close" to baseline breathing pattern. Denies pain/discomfort.  Objective: Vital signs Filed Vitals:   04/24/11 2107 04/25/11 0530 04/25/11 0845 04/25/11 0908  BP:  133/68  100/50  Pulse:  70  70  Temp:  98.2 F (36.8 C)    TempSrc:  Oral    Resp:  18    Height:      Weight:  82.2 kg (181 lb 3.5 oz)    SpO2: 90% 94% 95%    Weight change: -0.4 kg (-14.1 oz) Last BM Date: 04/24/11  Intake/Output from previous day: 01/20 0701 - 01/21 0700 In: 1563 [P.O.:1560; I.V.:3] Out: 800 [Urine:800] Total I/O In: 440 [P.O.:440] Out: 1 [Stool:1]   Physical Exam: General: Alert, awake, oriented x3, in no acute distress. Up in chair after ambulating in hall. Smiling HEENT: No bruits, no goiter. Mucus membranes moist/slightly pale Heart: Regular rate and rhythm, without murmurs, rubs, gallops. Lungs:Mild increased work of breathing, acyanotic. Breath sounds  clear to auscultation bilaterally. No wheeze, rhonchi. +air movement Abdomen: Soft, nontender, nondistended, positive bowel sounds. Extremities: No clubbing cyanosis or edema with positive pedal pulses. Neuro: Grossly intact, nonfocal.    Lab Results: Basic Metabolic Panel:  Basename 04/25/11 0402 04/24/11 0455  NA 138 139  K 4.3 4.2  CL 99 101  CO2 31 31  GLUCOSE 95 92  BUN 48* 45*  CREATININE 2.12* 2.02*  CALCIUM 8.9 8.9  MG -- --  PHOS -- --   Liver Function Tests: No results found for this basename: AST:2,ALT:2,ALKPHOS:2,BILITOT:2,PROT:2,ALBUMIN:2 in the last 72 hours No results found for this basename: LIPASE:2,AMYLASE:2 in the last 72 hours No results found for this basename: AMMONIA:2 in the last 72 hours CBC:  Basename 04/24/11 0455  WBC 8.7  NEUTROABS --  HGB 11.8*  HCT 36.8*  MCV 92.5  PLT 243   Cardiac Enzymes: No results found for this basename: CKTOTAL:3,CKMB:3,CKMBINDEX:3,TROPONINI:3 in the last 72 hours BNP:  Basename 04/25/11 0402 04/24/11 0455  PROBNP  4024.0* 4567.0*   D-Dimer: No results found for this basename: DDIMER:2 in the last 72 hours CBG: No results found for this basename: GLUCAP:6 in the last 72 hours Hemoglobin A1C: No results found for this basename: HGBA1C in the last 72 hours Fasting Lipid Panel: No results found for this basename: CHOL,HDL,LDLCALC,TRIG,CHOLHDL,LDLDIRECT in the last 72 hours Thyroid Function Tests: No results found for this basename: TSH,T4TOTAL,FREET4,T3FREE,THYROIDAB in the last 72 hours Anemia Panel: No results found for this basename: VITAMINB12,FOLATE,FERRITIN,TIBC,IRON,RETICCTPCT in the last 72 hours Coagulation:  Basename 04/25/11 0402 04/24/11 0455  LABPROT 23.7* 30.1*  INR 2.07* 2.82*   Urine Drug Screen: Drugs of Abuse  No results found for this basename: labopia, cocainscrnur, labbenz, amphetmu, thcu, labbarb    Alcohol Level: No results found for this basename: ETH:2 in the last 72 hours Urinalysis:  Misc. Labs:  Recent Results (from the past 240 hour(s))  CULTURE, SPUTUM-ASSESSMENT     Status: Normal   Collection Time   04/20/11  7:58 AM      Component Value Range Status Comment   Specimen Description SPUTUM   Final    Special Requests NONE   Final    Sputum evaluation     Final    Value: THIS SPECIMEN IS ACCEPTABLE. RESPIRATORY CULTURE REPORT TO FOLLOW.   Report Status 04/20/2011 FINAL   Final   CULTURE, RESPIRATORY     Status: Normal   Collection Time   04/20/11  7:58 AM  Component Value Range Status Comment   Specimen Description SPUTUM   Final    Special Requests NONE   Final    Gram Stain     Final    Value: FEW WBC PRESENT, PREDOMINANTLY PMN     NO SQUAMOUS EPITHELIAL CELLS SEEN     NO ORGANISMS SEEN   Culture NORMAL OROPHARYNGEAL FLORA   Final    Report Status 04/22/2011 FINAL   Final     Studies/Results: No results found.  Medications: Scheduled Meds:   . amiodarone  200 mg Oral Daily  . aspirin  81 mg Oral Daily  . budesonide-formoterol  2 puff  Inhalation BID  . furosemide  20 mg Oral Once  . furosemide  40 mg Oral Daily  . metoprolol  50 mg Oral BID  . moxifloxacin  400 mg Oral q1800  . pantoprazole  40 mg Oral Daily  . predniSONE  40 mg Oral Q breakfast  . sertraline  12.5 mg Oral Daily  . sodium chloride  3 mL Intravenous Q12H  . warfarin  0.5 mg Oral ONCE-1800   Continuous Infusions:  PRN Meds:.colchicine, diphenhydrAMINE, docusate sodium, levalbuterol, senna, sodium chloride, traMADol, zolpidem  Assessment/Plan:  Principal Problem:  Plan  1. Acute on chronic respiratory failure secondary to community acquired pneumonia in setting of copd- continues to improve.. Wbc normal. Afebrile. To complete course of avelox, 04/25/11 will be 7th day of antibiotics. Sats drop to 88% with ambulation on 4L but quickly rebound. Will monitor and try to wean O2 to home level 2/L.  Will continue prednisone taper. 2. afib on coumadin- inr now in therapeutic range. Appreciate pharmacy input. Target inr 2-3. Currently in sinus.  3. CKD stage 4- stable.  4. CAD/hyperlipidemia/htn- stable.  Disposition- home 24-48hrs   Elevated BNP nonspecific due to ckd, although patient has aortic valve disease- monitor on lasix.      LOS: 6 days   [facility] M 04/25/2011, 12:29 PM  Dicussed plan of care with Clydie Braun, saw patient at bed side in the presence of his supportive family. Will cover with vanc till final ID of respiratory culture. Think yeas is colonization.

## 2011-04-25 NOTE — Progress Notes (Signed)
Physical Therapy Treatment Patient Details Name: Adam [name] MRN: [id number] DOB: 02-04-1921 Today's Date: 04/25/2011  PNA 12:00 - 12:20 1 gt  PT Assessment/Plan  PT - Assessment/Plan Comments on Treatment Session: Pt states he feels a whole lot better than he did first coming in. PT Plan: Discharge plan remains appropriate Follow Up Recommendations: Home health PT PT Goals  Acute Rehab PT Goals PT Goal Formulation: With patient Pt will go Sit to Stand: with modified independence PT Goal: Sit to Stand - Progress: Progressing toward goal Pt will go Stand to Sit: with modified independence PT Goal: Stand to Sit - Progress: Progressing toward goal Pt will Stand: with modified independence PT Goal: Stand - Progress: Progressing toward goal Pt will Ambulate: >150 feet;with supervision;with rolling walker PT Goal: Ambulate - Progress: Progressing toward goal Pt will Go Up / Down Stairs: 1-2 stairs;with min assist PT Goal: Up/Down Stairs - Progress: Progressing toward goal  PT Treatment Precautions/Restrictions  Precautions Precautions: Fall (also 4 lts O2) Restrictions Weight Bearing Restrictions: No Mobility (including Balance) Bed Mobility Bed Mobility: No (Pt OOB in recliner) Transfers Transfers: Yes Sit to Stand: 5: Supervision;From chair/3-in-1 Ambulation/Gait Ambulation/Gait: Yes Ambulation/Gait Assistance: 4: Min assist Ambulation/Gait Assistance Details (indicate cue type and reason): On 4 lts O2 lowest recorded 85% Pt instructed on purse lip breathing and rest break, recovery to low 90's Ambulation Distance (Feet): 85 Feet Assistive device: Rolling walker Gait Pattern: Trunk flexed Stairs: No Wheelchair Mobility Wheelchair Mobility: No    Exercise    End of Session PT - End of Session Equipment Utilized During Treatment: Gait belt Activity Tolerance: Patient tolerated treatment well Patient left: in chair;with call bell in reach General Behavior During  Session: [facility] for tasks performed Cognition: [facility] for tasks performed  Felecia Shelling  PTA [facility]  Acute  Rehab Pager     [phone]

## 2011-04-25 NOTE — Telephone Encounter (Signed)
FYI WILL FORWARD TO DR Eden Emms FOR REVIEW./CY

## 2011-04-26 LAB — PROTIME-INR: Prothrombin Time: 20.1 seconds — ABNORMAL HIGH (ref 11.6–15.2)

## 2011-04-26 LAB — BASIC METABOLIC PANEL
BUN: 46 mg/dL — ABNORMAL HIGH (ref 6–23)
Chloride: 101 mEq/L (ref 96–112)
GFR calc Af Amer: 32 mL/min — ABNORMAL LOW (ref >90)
Potassium: 4.6 mEq/L (ref 3.5–5.1)

## 2011-04-26 MED ORDER — SODIUM CHLORIDE 0.9 % IV BOLUS (SEPSIS)
250.0000 mL | Freq: Once | INTRAVENOUS | Status: AC
  Administered 2011-04-26: 250 mL via INTRAVENOUS

## 2011-04-26 MED ORDER — WARFARIN SODIUM 2.5 MG PO TABS
2.5000 mg | ORAL_TABLET | Freq: Once | ORAL | Status: AC
  Administered 2011-04-26: 2.5 mg via ORAL
  Filled 2011-04-26: qty 1

## 2011-04-26 NOTE — Progress Notes (Signed)
Subjective: "im still coughing up thick stuff" Denies pain/discomfort.  Objective: Vital signs Filed Vitals:   04/26/11 0537 04/26/11 0819 04/26/11 1115 04/26/11 1420  BP: 127/81   94/57  Pulse: 70   77  Temp: 96 F (35.6 C)   97.6 F (36.4 C)  TempSrc: Axillary   Oral  Resp: 20   20  Height:      Weight:      SpO2: 90% 91% 90% 92%   Weight change: 0.2 kg (7.1 oz) Last BM Date: 04/25/11  Intake/Output from previous day: 01/21 0701 - 01/22 0700 In: 1120 [P.O.:920; IV Piggyback:200] Out: 851 [Urine:850; Stool:1] Total I/O In: 220 [P.O.:220] Out: 2 [Urine:1; Stool:1]   Physical Exam: General: Alert, awake, oriented x3, in no acute distress. Up in chair finishing lunch, smiling talkative HEENT: No bruits, no goiter. Mucus membranes moist/pink Heart: Regular rate and rhythm, without murmurs, rubs, gallops. Lungs: Mild to moderate increased work of breathing with conversation. Breath sounds with faint expiratory wheezing. No rales. Abdomen: Soft, nontender, nondistended, positive bowel sounds. Extremities: No clubbing cyanosis or edema with positive pedal pulses. Neuro: Grossly intact, nonfocal. Speech clear    Lab Results: Basic Metabolic Panel:  Basename 04/26/11 0410 04/25/11 0402  NA 140 138  K 4.6 4.3  CL 101 99  CO2 33* 31  GLUCOSE 95 95  BUN 46* 48*  CREATININE 2.03* 2.12*  CALCIUM 8.9 8.9  MG -- --  PHOS -- --   Liver Function Tests: No results found for this basename: AST:2,ALT:2,ALKPHOS:2,BILITOT:2,PROT:2,ALBUMIN:2 in the last 72 hours No results found for this basename: LIPASE:2,AMYLASE:2 in the last 72 hours No results found for this basename: AMMONIA:2 in the last 72 hours CBC:  Basename 04/24/11 0455  WBC 8.7  NEUTROABS --  HGB 11.8*  HCT 36.8*  MCV 92.5  PLT 243   Cardiac Enzymes: No results found for this basename: CKTOTAL:3,CKMB:3,CKMBINDEX:3,TROPONINI:3 in the last 72 hours BNP:  Basename 04/25/11 0402 04/24/11 0455  PROBNP  4024.0* 4567.0*   D-Dimer: No results found for this basename: DDIMER:2 in the last 72 hours CBG: No results found for this basename: GLUCAP:6 in the last 72 hours Hemoglobin A1C: No results found for this basename: HGBA1C in the last 72 hours Fasting Lipid Panel: No results found for this basename: CHOL,HDL,LDLCALC,TRIG,CHOLHDL,LDLDIRECT in the last 72 hours Thyroid Function Tests: No results found for this basename: TSH,T4TOTAL,FREET4,T3FREE,THYROIDAB in the last 72 hours Anemia Panel: No results found for this basename: VITAMINB12,FOLATE,FERRITIN,TIBC,IRON,RETICCTPCT in the last 72 hours Coagulation:  Basename 04/26/11 0410 04/25/11 0402  LABPROT 20.1* 23.7*  INR 1.68* 2.07*   Urine Drug Screen: Drugs of Abuse  No results found for this basename: labopia, cocainscrnur, labbenz, amphetmu, thcu, labbarb    Alcohol Level: No results found for this basename: ETH:2 in the last 72 hours Urinalysis:  Misc. Labs:  Recent Results (from the past 240 hour(s))  CULTURE, SPUTUM-ASSESSMENT     Status: Normal   Collection Time   04/20/11  7:58 AM      Component Value Range Status Comment   Specimen Description SPUTUM   Final    Special Requests NONE   Final    Sputum evaluation     Final    Value: THIS SPECIMEN IS ACCEPTABLE. RESPIRATORY CULTURE REPORT TO FOLLOW.   Report Status 04/20/2011 FINAL   Final   CULTURE, RESPIRATORY     Status: Normal   Collection Time   04/20/11  7:58 AM      Component Value Range  Status Comment   Specimen Description SPUTUM   Final    Special Requests NONE   Final    Gram Stain     Final    Value: FEW WBC PRESENT, PREDOMINANTLY PMN     NO SQUAMOUS EPITHELIAL CELLS SEEN     NO ORGANISMS SEEN   Culture NORMAL OROPHARYNGEAL FLORA   Final    Report Status 04/22/2011 FINAL   Final   GRAM STAIN     Status: Normal   Collection Time   04/25/11  2:53 PM      Component Value Range Status Comment   Specimen Description SPUTUM   Final    Special Requests  NONE   Final    Gram Stain     Final    Value: MODERATE WBC PRESENT, PREDOMINANTLY PMN     FEW GRAM POSITIVE COCCI IN CHAINS IN PAIRS     BUDDING YEAST     Gram Stain Report Called to,Read Back By and Verified With: HARLESS,E. AT 1551 ON [id number] BY LOVE,T.   Report Status 04/25/2011 FINAL   Final     Studies/Results: No results found.  Medications: Scheduled Meds:   . amiodarone  200 mg Oral Daily  . aspirin  81 mg Oral Daily  . budesonide-formoterol  2 puff Inhalation BID  . furosemide  20 mg Oral Daily  . furosemide  40 mg Oral Daily  . metoprolol  50 mg Oral BID  . moxifloxacin  400 mg Oral q1800  . pantoprazole  40 mg Oral Daily  . predniSONE  30 mg Oral Q breakfast  . sertraline  12.5 mg Oral Daily  . sodium chloride  3 mL Intravenous Q12H  . vancomycin  1,000 mg Intravenous Q24H  . warfarin  1 mg Oral ONCE-1800  . warfarin  2.5 mg Oral ONCE-1800  . DISCONTD: predniSONE  20 mg Oral Q breakfast   Continuous Infusions:  PRN Meds:.colchicine, diphenhydrAMINE, docusate sodium, levalbuterol, senna, sodium chloride, traMADol, zolpidem  Assessment/Plan:  Principal Problem:  Plan  1. Acute on chronic respiratory failure secondary to community acquired pneumonia in setting of copd- . Wbc normal. Afebrile. To complete course of avelox, 04/25/11 will be 7th day of antibiotics. Sputum gram stain yields few gram + cocci and budding yeast. Vancomycin started 04/25/11. Sats drop to 77% with ambulation on 3L and took 4-5 minutes to  rebound. Sats range  90-92 on 3L. Will continue prednisone at current dose i.e. 30mg  day #2. 2. afib on coumadin- inr now in therapeutic range. Appreciate pharmacy input. Target inr 2-3. Currently in sinus.  3. CKD stage 4- stable. Creatinine at baseline. Did get extra 20mg  lasix po yesterday for pro BNP of 4024.  4. CAD/hyperlipidemia/htn- stable.  Disposition-  Hopefully home soon. Wife updated via telephone.  Elevated BNP nonspecific due to ckd, although  patient has aortic valve disease- monitor on lasix.       LOS: 7 days   [facility] M 04/26/2011, 3:33 PM  Discussed plan of care with Clydie Braun. Saw and examined Mr devan at bed side. BP rather low this evening, ? Cause. Give fluid challenge. Hold BP meds. If remains low tonight, may need further work up.

## 2011-04-26 NOTE — Progress Notes (Signed)
Physical Therapy Treatment Patient Details Name: Adam [name] MRN: [id number] DOB: 12-17-1920 Today's Date: 04/26/2011 Time: 7829-5621  1G PT Assessment/Plan  PT - Assessment/Plan Comments on Treatment Session: Increased activity this session. O2 sats dropped to 77% on 3LO2-increased to 4L O2 while pt recovering. Required 4-5 minutes for sats to reach 90%.  Follow Up Recommendations: Home health PT PT Goals  Acute Rehab PT Goals PT Goal: Sit to Stand - Progress: Progressing toward goal PT Goal: Stand to Sit - Progress: Progressing toward goal PT Goal: Stand - Progress: Progressing toward goal PT Goal: Ambulate - Progress: Progressing toward goal  PT Treatment Precautions/Restrictions  Precautions Precautions: Fall Restrictions Weight Bearing Restrictions: No Mobility (including Balance) Bed Mobility Bed Mobility: No Transfers Transfers: Yes Sit to Stand Details (indicate cue type and reason): Mini-guard assist. Vcs safety, hand placement. Stand to Sit Details: Min-guard assist. VCs safety, hand placement.  Ambulation/Gait Ambulation/Gait: Yes Ambulation/Gait Assistance Details (indicate cue type and reason): Min-guard assist. Seated rest break needed due to SOB and low O2 sats.  Ambulation Distance (Feet): 125 Feet (125' x 1. 90' x 1.) Assistive device: Rolling walker Gait Pattern: Step-through pattern    Exercise    End of Session PT - End of Session Equipment Utilized During Treatment: Gait belt Activity Tolerance: Other (comment) (Limited by SOB, low O2 sats with activity) Patient left: in chair;with call bell in reach General Behavior During Session: [facility] for tasks performed Cognition: [facility] for tasks performed  Rebeca Alert [facility] 04/26/2011, 11:51 AM [phone]

## 2011-04-26 NOTE — Progress Notes (Signed)
ANTICOAGULATION CONSULT NOTE - Follow Up Consult  Pharmacy Consult for Warfarin Indication: Atrial fibrillation  Allergies  Allergen Reactions  . Penicillins     Patient Measurements: Height: 5\' 11"  (180.3 cm) Weight: 181 lb 10.5 oz (82.4 kg) (standing scale b) IBW/kg (Calculated) : 75.3    Vital Signs: Temp: 96 F (35.6 C) (01/22 0537) Temp src: Axillary (01/22 0537) BP: 127/81 mmHg (01/22 0537) Pulse Rate: 70  (01/22 0537)  Labs:  Basename 04/26/11 0410 04/25/11 0402 04/24/11 0455  HGB -- -- 11.8*  HCT -- -- 36.8*  PLT -- -- 243  APTT -- -- --  LABPROT 20.1* 23.7* 30.1*  INR 1.68* 2.07* 2.82*  HEPARINUNFRC -- -- --  CREATININE 2.03* 2.12* 2.02*  CKTOTAL -- -- --  CKMB -- -- --  TROPONINI -- -- --   Estimated Creatinine Clearance: 26.8 ml/min (by C-G formula based on Cr of 2.03).   Medications:  Scheduled:     . amiodarone  200 mg Oral Daily  . aspirin  81 mg Oral Daily  . budesonide-formoterol  2 puff Inhalation BID  . furosemide  20 mg Oral Daily  . furosemide  40 mg Oral Daily  . metoprolol  50 mg Oral BID  . moxifloxacin  400 mg Oral q1800  . pantoprazole  40 mg Oral Daily  . predniSONE  30 mg Oral Q breakfast  . sertraline  12.5 mg Oral Daily  . sodium chloride  3 mL Intravenous Q12H  . vancomycin  1,000 mg Intravenous Q24H  . warfarin  1 mg Oral ONCE-1800  . DISCONTD: predniSONE  20 mg Oral Q breakfast  . DISCONTD: predniSONE  40 mg Oral Q breakfast   Infusions:   PRN: colchicine, diphenhydrAMINE, docusate sodium, levalbuterol, senna, sodium chloride, traMADol, zolpidem  Assessment: 78 year old male on Coumadin prior to admission for history of atrial fibrillation. INR below goal range as expected after warfarin had been held for 4 days due to supratherapeutic INR. No bleeding reported in chart notes. Using conservative warfarin dosing given recent prolonged INR >3.  Home dose: 3.75 mg Daily except for 2.5 mg on Thursdays   Drug interaction  with amiodarone noted (home med). Drug interaction with avelox  - started 1/19 - can cause INR to increase.  Goal of Therapy:  INR 2-3   Plan:  1.) Coumadin 2.5mg  po x 1 tonight 2.) Follow up am INR   Annia Belt PharmD Pager: [phone] 6:49 AM 04/26/2011

## 2011-04-27 ENCOUNTER — Encounter: Payer: Medicare HMO | Admitting: *Deleted

## 2011-04-27 ENCOUNTER — Inpatient Hospital Stay (HOSPITAL_COMMUNITY): Payer: Medicare HMO

## 2011-04-27 DIAGNOSIS — J8410 Pulmonary fibrosis, unspecified: Secondary | ICD-10-CM

## 2011-04-27 DIAGNOSIS — J189 Pneumonia, unspecified organism: Secondary | ICD-10-CM

## 2011-04-27 LAB — BASIC METABOLIC PANEL
CO2: 31 mEq/L (ref 19–32)
Calcium: 8.7 mg/dL (ref 8.4–10.5)
GFR calc Af Amer: 30 mL/min — ABNORMAL LOW (ref >90)
GFR calc non Af Amer: 26 mL/min — ABNORMAL LOW (ref >90)
Sodium: 138 mEq/L (ref 135–145)

## 2011-04-27 LAB — CBC
MCV: 93.4 fL (ref 78.0–100.0)
Platelets: 236 10*3/uL (ref 150–400)
RBC: 3.93 MIL/uL — ABNORMAL LOW (ref 4.22–5.81)
WBC: 8.6 10*3/uL (ref 4.0–10.5)

## 2011-04-27 LAB — PROTIME-INR: INR: 1.64 — ABNORMAL HIGH (ref 0.00–1.49)

## 2011-04-27 MED ORDER — DEXTROSE 5 % IV SOLN
1.0000 g | INTRAVENOUS | Status: DC
  Administered 2011-04-27 – 2011-04-28 (×2): 1 g via INTRAVENOUS
  Filled 2011-04-27 (×2): qty 1

## 2011-04-27 MED ORDER — LEVOFLOXACIN IN D5W 750 MG/150ML IV SOLN
750.0000 mg | INTRAVENOUS | Status: DC
  Administered 2011-04-27: 750 mg via INTRAVENOUS
  Filled 2011-04-27 (×2): qty 150

## 2011-04-27 MED ORDER — METHYLPREDNISOLONE SODIUM SUCC 125 MG IJ SOLR
80.0000 mg | Freq: Two times a day (BID) | INTRAMUSCULAR | Status: DC
  Administered 2011-04-27 – 2011-04-29 (×4): 80 mg via INTRAVENOUS
  Filled 2011-04-27 (×6): qty 1.28

## 2011-04-27 MED ORDER — WARFARIN SODIUM 3 MG PO TABS
3.0000 mg | ORAL_TABLET | Freq: Once | ORAL | Status: AC
  Administered 2011-04-27: 3 mg via ORAL
  Filled 2011-04-27: qty 1

## 2011-04-27 NOTE — Progress Notes (Signed)
Pulmonology has has assumed care. Appreciate their care.

## 2011-04-27 NOTE — Progress Notes (Addendum)
Occupational Therapy Treatment Patient Details Name: Adam [name] MRN: [id number] DOB: October 12, 1920 Today's Date: 04/27/2011 Time in: 10:22 am Time out: 10:50 am 1SC, 1TA  OT Assessment/Plan OT Assessment/Plan Comments on Treatment Session: Pt motivated and very pleasant. Will benefit from continued OT services to increase strength and independence and reinforce energy conservation techniques with all ADL OT Plan: Discharge plan remains appropriate OT Frequency: Min 2X/week Follow Up Recommendations: Home health OT Equipment Recommended: 3 in 1 bedside comode OT Goals ADL Goals ADL Goal: Grooming - Progress: Progressing toward goals ADL Goal: Toilet Transfer - Progress: Progressing toward goals Pt Will Perform Tub/Shower Transfer: Shower transfer;with DME;Other (comment) (built in seat) ADL Goal: Web designer - Progress: Other (comment) (goal updated: pt has a walk in shower with built in seat) Miscellaneous OT Goals OT Goal: Miscellaneous Goal #1 - Progress: Progressing toward goals  OT Treatment Precautions/Restrictions  Precautions Precautions: Fall   ADL ADL Grooming: Performed;Teeth care;Minimal assistance Grooming Details (indicate cue type and reason): stood for about 3 minutes. Initially min assist as he was leaning slightly posteriorly but improved to min guard assist as task progressed. Where Assessed - Grooming: Standing at sink Toilet Transfer: Performed;Minimal assistance Toilet Transfer Method: Proofreader: Raised toilet seat with arms (or 3-in-1 over toilet);Other (comment) (RW) ADL Comments: Pt desats easily and was at the lowest of 82% after standing to groom for about 3 minutes on 2L O2. Increased O2 to 3L but pt only at 90-91%. Increased to 3.5 L and O2 sats up to 92% Informed nursing who requests to leave pt at 3.5 L. Discussed energy conservation techniques with pt including to sit down to rest frequently and use shower chair to  conserve energy. Pt verbalized understanding of all. He demonstrates good purse lip breathing technque. Pt will benefit from a 3in1. He has a walk in shower with built in seat.  Mobility  Bed Mobility Supine to Sit: 5: Supervision Transfers Sit to Stand: 4: Min assist;From bed;From chair/3-in-1;With upper extremity assist Sit to Stand Details (indicate cue type and reason): min guard assist Stand to Sit Details: min guard assist Exercises    End of Session OT - End of Session Equipment Utilized During Treatment: Other (comment) (RW) Activity Tolerance: Patient limited by fatigue;Other (comment) (limited by decreased O2 sats and frequent monitoring. ) Patient left: in chair;with call bell in reach General Behavior During Session: [facility] for tasks performed Cognition: [facility] for tasks performed  Lennox Laity  [phone] 04/27/2011, 12:06 PM

## 2011-04-27 NOTE — Progress Notes (Signed)
ANTIBIOTIC CONSULT NOTE - INITIAL  Pharmacy Consult for Cefepime, Levaquin, Vanco Indication: Possible HCAP  Allergies  Allergen Reactions  . Penicillins     Patient Measurements: Height: 5\' 11"  (180.3 cm) Weight: 182 lb 8.7 oz (82.8 kg) IBW/kg (Calculated) : 75.3   Vital Signs: Temp: 97.8 F (36.6 C) (01/23 1250) Temp src: Oral (01/23 1250) BP: 106/63 mmHg (01/23 1250) Pulse Rate: 69  (01/23 1250) Intake/Output from previous day: 01/22 0701 - 01/23 0700 In: 420 [P.O.:220; IV Piggyback:200] Out: 802 [Urine:801; Stool:1] Intake/Output from this shift: Total I/O In: 483 [P.O.:480; I.V.:3] Out: 100 [Urine:100]  Labs:  Basename 04/27/11 0418 04/26/11 0410 04/25/11 0402  WBC 8.6 -- --  HGB 11.5* -- --  PLT 236 -- --  LABCREA -- -- --  CREATININE 2.16* 2.03* 2.12*   Estimated Creatinine Clearance: 25.2 ml/min (by C-G formula based on Cr of 2.16). No results found for this basename: VANCOTROUGH:2,VANCOPEAK:2,VANCORANDOM:2,GENTTROUGH:2,GENTPEAK:2,GENTRANDOM:2,TOBRATROUGH:2,TOBRAPEAK:2,TOBRARND:2,AMIKACINPEAK:2,AMIKACINTROU:2,AMIKACIN:2, in the last 72 hours   Microbiology: Recent Results (from the past 720 hour(s))  CULTURE, SPUTUM-ASSESSMENT     Status: Normal   Collection Time   04/20/11  7:58 AM      Component Value Range Status Comment   Specimen Description SPUTUM   Final    Special Requests NONE   Final    Sputum evaluation     Final    Value: THIS SPECIMEN IS ACCEPTABLE. RESPIRATORY CULTURE REPORT TO FOLLOW.   Report Status 04/20/2011 FINAL   Final   CULTURE, RESPIRATORY     Status: Normal   Collection Time   04/20/11  7:58 AM      Component Value Range Status Comment   Specimen Description SPUTUM   Final    Special Requests NONE   Final    Gram Stain     Final    Value: FEW WBC PRESENT, PREDOMINANTLY PMN     NO SQUAMOUS EPITHELIAL CELLS SEEN     NO ORGANISMS SEEN   Culture NORMAL OROPHARYNGEAL FLORA   Final    Report Status 04/22/2011 FINAL   Final     GRAM STAIN     Status: Normal   Collection Time   04/25/11  2:53 PM      Component Value Range Status Comment   Specimen Description SPUTUM   Final    Special Requests NONE   Final    Gram Stain     Final    Value: MODERATE WBC PRESENT, PREDOMINANTLY PMN     FEW GRAM POSITIVE COCCI IN CHAINS IN PAIRS     BUDDING YEAST     Gram Stain Report Called to,Read Back By and Verified With: HARLESS,E. AT 1551 ON [id number] BY LOVE,T.   Report Status 04/25/2011 FINAL   Final     Medical History: Past Medical History  Diagnosis Date  . Cancer     Lung  . COPD (chronic obstructive pulmonary disease)   . Diverticulosis of colon   . GERD (gastroesophageal reflux disease)     Stricture  . Hyperlipidemia   . Hypertension   . Esophageal dysmotility   . Osteoarthritis   . Aortic stenosis     s/p AVR CABG  . Mild renal insufficiency   . Erectile dysfunction   . Venous insufficiency   . Gout   . BPH (benign prostatic hypertrophy)   . Atrioventricular block, complete     cx cabg/AVR  . Pacemaker     st judes    Medications:  Anti-infectives  Start     Dose/Rate Route Frequency Ordered Stop   04/25/11 1700   vancomycin (VANCOCIN) IVPB 1000 mg/200 mL premix        1,000 mg 200 mL/hr over 60 Minutes Intravenous Every 24 hours 04/25/11 1608     04/23/11 1800   moxifloxacin (AVELOX) tablet 400 mg  Status:  Discontinued        400 mg Oral Daily-1800 04/23/11 1432 04/27/11 1337   04/20/11 1200   cefTRIAXone (ROCEPHIN) 1 g in dextrose 5 % 50 mL IVPB  Status:  Discontinued        1 g 100 mL/hr over 30 Minutes Intravenous Every 24 hours 04/19/11 2058 04/23/11 1432   04/20/11 1100   azithromycin (ZITHROMAX) 500 mg in dextrose 5 % 250 mL IVPB        500 mg 250 mL/hr over 60 Minutes Intravenous Every 24 hours 04/19/11 2058 04/22/11 1130   04/19/11 2030   cefTRIAXone (ROCEPHIN) 1 g in dextrose 5 % 50 mL IVPB  Status:  Discontinued        1 g 100 mL/hr over 30 Minutes Intravenous Every 24  hours 04/19/11 2029 04/19/11 2058   04/19/11 2030   azithromycin (ZITHROMAX) 500 mg in dextrose 5 % 250 mL IVPB  Status:  Discontinued        500 mg 250 mL/hr over 60 Minutes Intravenous Every 24 hours 04/19/11 2029 04/19/11 2058   04/19/11 1430   cefTRIAXone (ROCEPHIN) 1 g in dextrose 5 % 50 mL IVPB        1 g 100 mL/hr over 30 Minutes Intravenous  Once 04/19/11 1430 04/19/11 1735   04/19/11 1430   azithromycin (ZITHROMAX) 500 mg in dextrose 5 % 250 mL IVPB        500 mg 250 mL/hr over 60 Minutes Intravenous  Once 04/19/11 1430 04/19/11 1624         Assessment: 78 yo M with new CXR infiltrate. Per v.o. Anders Simmonds, NP, change abx to Vanco, Cefepime, and Levaquin, and d/c po amiodarone. PCN allergy noted. Pt has already previously had 5 doses of ceftriaxone this admit, no adverse reactions noted. This was discussed with CCM NP and decision was made to proceed with cefepime. Will renally adjust for CrCl 25 ml/min.  Goal of Therapy:  Vancomycin trough level 15-20 mcg/ml  Plan:  1)  D/C PO avelox and amiodarone (v.o. Anders Simmonds, NP) 2)  Start Levaquin 750mg  IV q48h 3)  Cefepime 1g IV q24h 4)  Continue Vanco 1g IV q24h  Annia Belt 04/27/2011,1:40 PM

## 2011-04-27 NOTE — Consult Note (Signed)
Name: Adam [name] MRN: [id number] DOB: May 11, 1920    LOS: 8  [facility]  History of Present Illness:  58 yowm with history of lung cancer, status post left lower lobectomy (1991) who is chronically on 2 L [place]  Admit 1/15 for increase in SOB, and minimally  productive cough. Patient denied any fever, chills, nausea and vomiting. Family reports sick contact 1 week prior to admission. Initial chest xray showed right upper lobe airspace disease. Zithromax, Ceftriaxone, and prednisone x for CAP> no better so PCCM was ask to evaluate the patient on 1/23.  L   Cultures: Sputum 1/16>>> few gram positive cocci in chains in pairs, budding yeast     Antibiotics: Zithromax 1/15>>> 1/18 Ceftriaxone 1/15>>> 1/19 Avelox PO 1/19>>> 1/23 Vancomycin 1/21(?HCAP)>>> Levaquin 1/23(?HCAP>>> Cefepime 1/23(?HCAP>>>  Tests / Events:     Past Medical History  Diagnosis Date  . Cancer     Lung  . COPD (chronic obstructive pulmonary disease)   . Diverticulosis of colon   . GERD (gastroesophageal reflux disease)     Stricture  . Hyperlipidemia   . Hypertension   . Esophageal dysmotility   . Osteoarthritis   . Aortic stenosis     s/p AVR CABG  . Mild renal insufficiency   . Erectile dysfunction   . Venous insufficiency   . Gout   . BPH (benign prostatic hypertrophy)   . Atrioventricular block, complete     cx cabg/AVR  . Pacemaker     st judes   Past Surgical History  Procedure Date  . Aortic valve replacement 05/04/09    with CABG x 3  . Pacemaker insertion 05/08/09    [employer], dual chamber  . Cholecystectomy   . Tonsillectomy   . Lobectomy 1991    Lung  . Prostate surgery 1990  . Thumb surgery     Left  . Varicose vein surgery   . Basal cell surgery     Nose  . Esophagogastroduodenoscopy 10/2005    Stricture  . Cardiolite 11/2003    Neg.  Mild to moderate AS  . Carotid US 11/2005    Neg  . Esophagogastroduodenoscopy 07/2004    Gastritis,  stricture, dysmotility  . Rib films 08/2005    Right 10th rib fracture  . Doppler echocardiography 09/2005    Stable  . 2d echocardiogram 06/2007    Mod aortic stenosis with EF 55%  . Colonoscopy     diverticulosis, small polyp, hemorrhoids   Prior to Admission medications   Medication Sig Start Date End Date Taking? Authorizing Provider  acetaminophen (TYLENOL) 650 MG CR tablet Take 1,950 mg by mouth daily as needed. For arthritis.   Yes Historical Provider, MD  amiodarone (PACERONE) 200 MG tablet Take 200 mg by mouth daily.  11/01/10  Yes Wendall Stade, MD  aspirin 81 MG tablet Take 81 mg by mouth daily.     Yes Historical Provider, MD  budesonide-formoterol (SYMBICORT) 160-4.5 MCG/ACT inhaler Inhale 2 puffs into the lungs 2 (two) times daily. 12/20/10 12/20/11 Yes Leslye Peer, MD  colchicine 0.6 MG tablet Take 1 tablet (0.6 mg total) by mouth 2 (two) times daily as needed. 11/22/10  Yes Wendall Stade, MD  diphenhydrAMINE (BENADRYL) 25 MG tablet Take 25 mg by mouth every 6 (six) hours as needed. For sleep.   Yes Historical Provider, MD  docusate sodium (COLACE) 100 MG capsule Take 100 mg by mouth 2 (two) times daily as needed. For  constipation.   Yes Historical Provider, MD  furosemide (LASIX) 40 MG tablet Take 1 tablet (40 mg total) by mouth daily. 11/10/10  Yes Wendall Stade, MD  lansoprazole (PREVACID) 30 MG capsule Take 1 capsule (30 mg total) by mouth 2 (two) times daily. 04/06/11  Yes Roxy Manns, MD  levalbuterol [facility] HFA) 45 MCG/ACT inhaler Inhale 1-2 puffs into the lungs every 6 (six) hours as needed. For shortness of breath.   Yes Historical Provider, MD  meloxicam (MOBIC) 7.5 MG tablet Take 7.5 mg by mouth daily as needed. For pain.   Yes Historical Provider, MD  metoprolol (LOPRESSOR) 50 MG tablet Take 1 tablet (50 mg total) by mouth 2 (two) times daily. 08/04/10 08/04/11 Yes Duke Salvia, MD  senna (SENOKOT) 8.6 MG tablet Take 1 tablet by mouth daily as needed. For  constipation.   Yes Historical Provider, MD  sertraline (ZOLOFT) 25 MG tablet Take 12.5 mg by mouth daily.   Yes Historical Provider, MD  warfarin (COUMADIN) 2.5 MG tablet Take 2.5-3.75 mg by mouth daily. 1.5 tab daily except 1 tab on Thurs   Yes Historical Provider, MD   Allergies Allergies  Allergen Reactions  . Penicillins     Family History Family History  Problem Relation Age of Onset  . Cancer Mother     Pancreatic  . Heart disease Father     CAD  . Cancer Sister     ? abdominal CA  . Cancer Brother     Colon  . Heart disease Maternal Grandmother     Social History  reports that he quit smoking about 22 years ago. His smoking use included Cigarettes. He quit after 40 years of use. He has never used smokeless tobacco. He reports that he does not drink alcohol or use illicit drugs.  Review Of Systems  11 points review of systems is negative with an exception of listed in HPI.  Vital Signs: Temp:  [97.7 F (36.5 C)-97.9 F (36.6 C)] 97.8 F (36.6 C) (01/23 1300) Pulse Rate:  [69-78] 78  (01/23 1300) Resp:  [16-18] 18  (01/23 1300) BP: (91-123)/(52-68) 91/52 mmHg (01/23 1300) SpO2:  [82 %-94 %] 93 % (01/23 1300) Weight:  [82.8 kg (182 lb 8.7 oz)] 82.8 kg (182 lb 8.7 oz) (01/23 0500)      Intake/Output Summary (Last 24 hours) at 04/27/11 1517 Last data filed at 04/27/11 1100  Gross per 24 hour  Intake    683 ml  Output    900 ml  Net   -217 ml    Physical Examination: General:  No acute distress Neuro:  Alert and oriented HEENT:   Neck:  No JVD Cardiovascular:  RRR Lungs:  Crackles in bases, otherwise coarse Abdomen:  Soft, non tender, +BS Musculoskeletal: No edema     Labs and Imaging:   Lab 04/27/11 0418 04/26/11 0410 04/25/11 0402  NA 138 140 138  K 4.3 4.6 4.3  CL 101 101 99  CO2 31 33* 31  BUN 43* 46* 48*  CREATININE 2.16* 2.03* 2.12*  GLUCOSE 91 95 95    Lab 04/27/11 0418 04/24/11 0455 04/22/11 0454  HGB 11.5* 11.8* 12.5*  HCT 36.7*  36.8* 39.2  WBC 8.6 8.7 10.1  PLT 236 243 232   CXR 1/23: Worsening bilateral infiltrates (R>L) when compared to 1/17  Assessment and Plan:  Progressive bilateral pulmonary infiltrates- DDX Post Viral pneumonitis vs. Amiodarone lung toxicity vs. CAP with Resistant organism- Despite antibiotic coverage CXR continues  to look worse. And patient does not report feeling better Plan: -Stop Amiodarone, can consult cards if patient goes into a-fib -Solumedrol IV  -monitor ESR, and procalcitonin -Hold on diuresis due to bump in creatinine   -Add Levaquin, cefepime, continue Vanco, d/c Avelox.  -Send flu PCR  Chronic Kidney Disease Lab Results  Component Value Date   CREATININE 2.16* 04/27/2011   CREATININE 2.03* 04/26/2011   CREATININE 2.12* 04/25/2011    Plan: -hold Lasix -monitor  Best practices / Disposition: -->Floor status PCCM -->DNR -->Heparin for DVT Px -->Protonix for GI Px -->diet -->family updated at bedside  Because he is a patient of [facility] and his primary problem is pulmonary we will assume care  BABCOCK,PETE 04/27/2011, 3:00 PM  Pt independently  seen and examined and available cxr's reviewed and I agree with above findings/ imp/ plan  discussed with fm at bedside  Sandrea Hughs, MD Pulmonary and Critical Care Medicine [facility] Cell [phone]

## 2011-04-27 NOTE — Progress Notes (Signed)
Subjective: "I feel great today!" Sitting up in bed, awaiting breakfast. NAD  Objective: Vital signs Filed Vitals:   04/26/11 1420 04/26/11 2116 04/26/11 2338 04/27/11 0500  BP: 94/57 95/54 121/68 123/67  Pulse: 77 70 72 78  Temp: 97.6 F (36.4 C) 97.7 F (36.5 C) 97.9 F (36.6 C) 97.9 F (36.6 C)  TempSrc: Oral Oral Oral Oral  Resp: 20 18 16 16   Height:      Weight:    82.8 kg (182 lb 8.7 oz)  SpO2: 92% 94% 91% 93%   Weight change: 0.4 kg (14.1 oz) Last BM Date: 04/25/11  Intake/Output from previous day: 01/22 0701 - 01/23 0700 In: 420 [P.O.:220; IV Piggyback:200] Out: 802 [Urine:801; Stool:1]     Physical Exam: General: Alert, awake, oriented x3, in no acute distress. Sitting up in bed, talkative. HEENT: No bruits, no goiter. PERRL Mucus membranes moist/pink Heart: Regular rate and rhythm, without murmurs, rubs, gallops. Lungs:Only mild increased work of breathing with conversation.Breath sounds somewhat distant in bases but otherwise clear to auscultation bilaterally. No wheeze, rale Abdomen: Soft, nontender, nondistended, positive bowel sounds. Extremities: No clubbing cyanosis or edema with positive pedal pulses. Neuro: Grossly intact, nonfocal. Speech clear    Lab Results: Basic Metabolic Panel:  Basename 04/27/11 0418 04/26/11 0410  NA 138 140  K 4.3 4.6  CL 101 101  CO2 31 33*  GLUCOSE 91 95  BUN 43* 46*  CREATININE 2.16* 2.03*  CALCIUM 8.7 8.9  MG -- --  PHOS -- --   Liver Function Tests: No results found for this basename: AST:2,ALT:2,ALKPHOS:2,BILITOT:2,PROT:2,ALBUMIN:2 in the last 72 hours No results found for this basename: LIPASE:2,AMYLASE:2 in the last 72 hours No results found for this basename: AMMONIA:2 in the last 72 hours CBC:  Basename 04/27/11 0418  WBC 8.6  NEUTROABS --  HGB 11.5*  HCT 36.7*  MCV 93.4  PLT 236   Cardiac Enzymes: No results found for this basename: CKTOTAL:3,CKMB:3,CKMBINDEX:3,TROPONINI:3 in the last 72  hours BNP:  Basename 04/25/11 0402  PROBNP 4024.0*   D-Dimer: No results found for this basename: DDIMER:2 in the last 72 hours CBG: No results found for this basename: GLUCAP:6 in the last 72 hours Hemoglobin A1C: No results found for this basename: HGBA1C in the last 72 hours Fasting Lipid Panel: No results found for this basename: CHOL,HDL,LDLCALC,TRIG,CHOLHDL,LDLDIRECT in the last 72 hours Thyroid Function Tests: No results found for this basename: TSH,T4TOTAL,FREET4,T3FREE,THYROIDAB in the last 72 hours Anemia Panel: No results found for this basename: VITAMINB12,FOLATE,FERRITIN,TIBC,IRON,RETICCTPCT in the last 72 hours Coagulation:  Basename 04/27/11 0418 04/26/11 0410  LABPROT 19.7* 20.1*  INR 1.64* 1.68*   Urine Drug Screen: Drugs of Abuse  No results found for this basename: labopia, cocainscrnur, labbenz, amphetmu, thcu, labbarb    Alcohol Level: No results found for this basename: ETH:2 in the last 72 hours Urinalysis:  Misc. Labs:  Recent Results (from the past 240 hour(s))  CULTURE, SPUTUM-ASSESSMENT     Status: Normal   Collection Time   04/20/11  7:58 AM      Component Value Range Status Comment   Specimen Description SPUTUM   Final    Special Requests NONE   Final    Sputum evaluation     Final    Value: THIS SPECIMEN IS ACCEPTABLE. RESPIRATORY CULTURE REPORT TO FOLLOW.   Report Status 04/20/2011 FINAL   Final   CULTURE, RESPIRATORY     Status: Normal   Collection Time   04/20/11  7:58 AM  Component Value Range Status Comment   Specimen Description SPUTUM   Final    Special Requests NONE   Final    Gram Stain     Final    Value: FEW WBC PRESENT, PREDOMINANTLY PMN     NO SQUAMOUS EPITHELIAL CELLS SEEN     NO ORGANISMS SEEN   Culture NORMAL OROPHARYNGEAL FLORA   Final    Report Status 04/22/2011 FINAL   Final   GRAM STAIN     Status: Normal   Collection Time   04/25/11  2:53 PM      Component Value Range Status Comment   Specimen Description  SPUTUM   Final    Special Requests NONE   Final    Gram Stain     Final    Value: MODERATE WBC PRESENT, PREDOMINANTLY PMN     FEW GRAM POSITIVE COCCI IN CHAINS IN PAIRS     BUDDING YEAST     Gram Stain Report Called to,Read Back By and Verified With: HARLESS,E. AT 1551 ON [id number] BY LOVE,T.   Report Status 04/25/2011 FINAL   Final     Studies/Results: No results found.  Medications: Scheduled Meds:   . amiodarone  200 mg Oral Daily  . aspirin  81 mg Oral Daily  . budesonide-formoterol  2 puff Inhalation BID  . furosemide  40 mg Oral Daily  . metoprolol  50 mg Oral BID  . moxifloxacin  400 mg Oral q1800  . pantoprazole  40 mg Oral Daily  . predniSONE  30 mg Oral Q breakfast  . sertraline  12.5 mg Oral Daily  . sodium chloride  250 mL Intravenous Once  . sodium chloride  3 mL Intravenous Q12H  . vancomycin  1,000 mg Intravenous Q24H  . warfarin  2.5 mg Oral ONCE-1800  . warfarin  3 mg Oral ONCE-1800   Continuous Infusions:  PRN Meds:.colchicine, diphenhydrAMINE, docusate sodium, levalbuterol, senna, sodium chloride, traMADol, zolpidem  Assessment/Plan:  Principal Problem:  . Acute on chronic respiratory failure secondary to community acquired pneumonia in setting of copd- .  Improvement very slow. Wbc normal. Afebrile. To complete course of avelox, day #9. Sputum gram stain yields few gram + cocci and budding yeast. Vancomycin day #3. Sats drop to 77% with ambulation on 3L and took 4-5 minutes to rebound. Sats range 90-92 on 3L. Will continue prednisone at current dose i.e. 30mg  day #2. Will repeat chest xray as follow up. Will continue to try and wean O2 to home dose of 2L.  2. Hypotension. BP a little soft last evening. Perhaps due to extra dosing of lasix. Antihypertensive held in evening and given 250ns. SBP 123. Will monitor and add parameters to antihypertensives.  2. afib on coumadin- initially supra therapeutic. Pharmacy dosing. Today 1.6 Appreciate pharmacy input. Target  inr 2-3. Currently in sinus.  3. CKD stage 4- stable. Creatinine at baseline. Did get extra 20mg  lasix po 1/21 for pro BNP of 4024.  4. CAD/hyperlipidemia/htn- stable.  Disposition- Hopefully home soon. Wife updated via telephone.   Elevated BNP nonspecific due to ckd, although patient has aortic valve disease- monitor on lasix.      LOS: 8 days   [facility] M 04/27/2011, 8:20 AM

## 2011-04-27 NOTE — Progress Notes (Signed)
ANTICOAGULATION CONSULT NOTE - Follow Up Consult  Pharmacy Consult for Warfarin Indication: Atrial fibrillation  Allergies  Allergen Reactions  . Penicillins     Patient Measurements: Height: 5\' 11"  (180.3 cm) Weight: 182 lb 8.7 oz (82.8 kg) IBW/kg (Calculated) : 75.3    Vital Signs: Temp: 97.9 F (36.6 C) (01/23 0500) Temp src: Oral (01/23 0500) BP: 123/67 mmHg (01/23 0500) Pulse Rate: 78  (01/23 0500)  Labs:  Basename 04/27/11 0418 04/26/11 0410 04/25/11 0402  HGB 11.5* -- --  HCT 36.7* -- --  PLT 236 -- --  APTT -- -- --  LABPROT 19.7* 20.1* 23.7*  INR 1.64* 1.68* 2.07*  HEPARINUNFRC -- -- --  CREATININE 2.16* 2.03* 2.12*  CKTOTAL -- -- --  CKMB -- -- --  TROPONINI -- -- --   Estimated Creatinine Clearance: 25.2 ml/min (by C-G formula based on Cr of 2.16).   Medications:  Scheduled:     . amiodarone  200 mg Oral Daily  . aspirin  81 mg Oral Daily  . budesonide-formoterol  2 puff Inhalation BID  . furosemide  40 mg Oral Daily  . metoprolol  50 mg Oral BID  . moxifloxacin  400 mg Oral q1800  . pantoprazole  40 mg Oral Daily  . predniSONE  30 mg Oral Q breakfast  . sertraline  12.5 mg Oral Daily  . sodium chloride  250 mL Intravenous Once  . sodium chloride  3 mL Intravenous Q12H  . vancomycin  1,000 mg Intravenous Q24H  . warfarin  2.5 mg Oral ONCE-1800   Infusions:   PRN: colchicine, diphenhydrAMINE, docusate sodium, levalbuterol, senna, sodium chloride, traMADol, zolpidem  Assessment: 78 year old male on Coumadin prior to admission for history of atrial fibrillation. INR below goal range as expected after warfarin had been held for 4 days due to supratherapeutic INR. No bleeding reported in chart notes. Using conservative warfarin dosing given recent prolonged INR >3 and drug interactions (see below).  Home dose: 3.75 mg Daily except for 2.5 mg on Thursdays   Drug interaction with amiodarone noted (home med). Drug interaction with avelox  -  started 1/19 - can cause INR to increase.  Goal of Therapy:  INR 2-3   Plan:  1.) Coumadin 3mg  po x 1 tonight 2.) Follow up am INR   Annia Belt PharmD Pager: [phone] 8:09 AM 04/27/2011

## 2011-04-28 ENCOUNTER — Inpatient Hospital Stay (HOSPITAL_COMMUNITY): Payer: Medicare HMO

## 2011-04-28 DIAGNOSIS — J8410 Pulmonary fibrosis, unspecified: Secondary | ICD-10-CM

## 2011-04-28 DIAGNOSIS — J189 Pneumonia, unspecified organism: Secondary | ICD-10-CM

## 2011-04-28 LAB — BASIC METABOLIC PANEL
CO2: 28 mEq/L (ref 19–32)
Calcium: 8.6 mg/dL (ref 8.4–10.5)
Chloride: 101 mEq/L (ref 96–112)
Creatinine, Ser: 1.96 mg/dL — ABNORMAL HIGH (ref 0.50–1.35)
Glucose, Bld: 146 mg/dL — ABNORMAL HIGH (ref 70–99)

## 2011-04-28 LAB — INFLUENZA PANEL BY PCR (TYPE A & B)
H1N1 flu by pcr: NOT DETECTED
Influenza B By PCR: NEGATIVE

## 2011-04-28 LAB — CBC
HCT: 36.6 % — ABNORMAL LOW (ref 39.0–52.0)
Hemoglobin: 11.6 g/dL — ABNORMAL LOW (ref 13.0–17.0)
MCH: 29.3 pg (ref 26.0–34.0)
MCV: 92.4 fL (ref 78.0–100.0)
RBC: 3.96 MIL/uL — ABNORMAL LOW (ref 4.22–5.81)
WBC: 9.5 10*3/uL (ref 4.0–10.5)

## 2011-04-28 LAB — PROCALCITONIN: Procalcitonin: 0.28 ng/mL

## 2011-04-28 LAB — SEDIMENTATION RATE: Sed Rate: 44 mm/hr — ABNORMAL HIGH (ref 0–16)

## 2011-04-28 MED ORDER — WARFARIN SODIUM 4 MG PO TABS
4.0000 mg | ORAL_TABLET | Freq: Once | ORAL | Status: AC
  Administered 2011-04-28: 4 mg via ORAL
  Filled 2011-04-28: qty 1

## 2011-04-28 MED ORDER — ZOLPIDEM TARTRATE 5 MG PO TABS
5.0000 mg | ORAL_TABLET | Freq: Every day | ORAL | Status: DC
  Administered 2011-04-28: 5 mg via ORAL
  Filled 2011-04-28: qty 1

## 2011-04-28 NOTE — Progress Notes (Signed)
ANTICOAGULATION CONSULT NOTE - Follow Up Consult  Pharmacy Consult for Warfarin Indication: Atrial fibrillation  Allergies  Allergen Reactions  . Penicillins     Patient Measurements: Height: 5\' 11"  (180.3 cm) Weight: 182 lb 1.6 oz (82.6 kg) IBW/kg (Calculated) : 75.3    Vital Signs: Temp: 98 F (36.7 C) (01/24 0607) Temp src: Oral (01/24 0607) BP: 136/64 mmHg (01/24 0607) Pulse Rate: 76  (01/24 0607)  Labs:  Basename 04/28/11 0420 04/27/11 0418 04/26/11 0410  HGB 11.6* 11.5* --  HCT 36.6* 36.7* --  PLT 247 236 --  APTT -- -- --  LABPROT 18.2* 19.7* 20.1*  INR 1.48 1.64* 1.68*  HEPARINUNFRC -- -- --  CREATININE 1.96* 2.16* 2.03*  CKTOTAL -- -- --  CKMB -- -- --  TROPONINI -- -- --   Estimated Creatinine Clearance: 27.7 ml/min (by C-G formula based on Cr of 1.96).   Medications:  Scheduled:     . aspirin  81 mg Oral Daily  . budesonide-formoterol  2 puff Inhalation BID  . ceFEPime (MAXIPIME) IV  1 g Intravenous Q24H  . levofloxacin (LEVAQUIN) IV  750 mg Intravenous Q48H  . methylPREDNISolone (SOLU-MEDROL) injection  80 mg Intravenous Q12H  . metoprolol  50 mg Oral BID  . pantoprazole  40 mg Oral Daily  . sertraline  12.5 mg Oral Daily  . sodium chloride  3 mL Intravenous Q12H  . vancomycin  1,000 mg Intravenous Q24H  . warfarin  3 mg Oral ONCE-1800  . DISCONTD: amiodarone  200 mg Oral Daily  . DISCONTD: furosemide  40 mg Oral Daily  . DISCONTD: moxifloxacin  400 mg Oral q1800  . DISCONTD: predniSONE  30 mg Oral Q breakfast   Infusions:   PRN: colchicine, diphenhydrAMINE, docusate sodium, levalbuterol, senna, sodium chloride, traMADol, zolpidem  Assessment: 78 year old male on Coumadin prior to admission for history of atrial fibrillation. INR continues to trend down after warfarin had been held for 4 days (1/16-1/19) due to supratherapeutic INR. Have been using conservative warfarin dosing given recent prolonged INR >3 and drug interactions (see below).  Will use higher dose of warfarin tonight.  Home dose: 3.75 mg Daily except for 2.5 mg on Thursdays   Drug interaction with amiodarone noted (stopped 1/23, but will still have effect on INR). Drug interaction with levaquin - switched from moxifloxacin to levaquin on 1/23- can cause INR to increase.  Goal of Therapy:  INR 2-3   Plan:  1.) Coumadin 4mg  po x 1 tonight 2.) Follow up am INR   Annia Belt PharmD Pager: [phone] 1:04 PM 04/28/2011

## 2011-04-28 NOTE — Progress Notes (Signed)
Physical Therapy Treatment Patient Details Name: Adam [name] MRN: [id number] DOB: February 11, 1921 Today's Date: 04/28/2011 Time: 1450-1503  1G PT Assessment/Plan  PT - Assessment/Plan Comments on Treatment Session: Pt continues to have drop in O2 sats with ambulation. VCs for pursed lip breathing throughout session. Continue to recommned HHPT. PT Plan: Discharge plan remains appropriate Follow Up Recommendations: Home health PT;Supervision/Assistance - 24 hour PT Goals  Acute Rehab PT Goals PT Goal: Sit to Stand - Progress: Progressing toward goal PT Goal: Stand to Sit - Progress: Progressing toward goal PT Goal: Ambulate - Progress: Progressing toward goal  PT Treatment Precautions/Restrictions  Precautions Precautions: Fall Restrictions Weight Bearing Restrictions: No Mobility (including Balance) Bed Mobility Bed Mobility: No Transfers Transfers: Yes Sit to Stand: 5: Supervision;From chair/3-in-1;With upper extremity assist;With armrests Sit to Stand Details (indicate cue type and reason): x 2. VCs safety, hand placement.  Stand to Sit: 5: Supervision;To chair/3-in-1 Stand to Sit Details: x 2. VCs safety, hand placement Ambulation/Gait Ambulation/Gait: Yes Ambulation/Gait Assistance Details (indicate cue type and reason): Min-guard assist. VCs safety, pacing.  Ambulation Distance (Feet): 125 Feet (x 2, with seated rest break) Assistive device: Rolling walker;None (x1 with RW, x1 without AD) Gait Pattern: Step-through pattern Gait velocity: Increased speed/pace. VCs for pt to slow speed.    Exercise    End of Session PT - End of Session Equipment Utilized During Treatment: Gait belt Activity Tolerance: Patient tolerated treatment well Patient left: in chair;with call bell in reach;with family/visitor present General Behavior During Session: [facility] for tasks performed Cognition: [facility] for tasks performed  Adam [name] [facility] 04/28/2011, 3:38 PM [phone]

## 2011-04-28 NOTE — Progress Notes (Addendum)
Name: Adam [name] MRN: [id number] DOB: 05-25-1920    LOS: 9  [facility]  History of Present Illness:  26 yowm with history of lung cancer, status post left lower lobectomy (1991) who is chronically on 2 L [place]  Admit 1/15 for increase in SOB, and minimally  productive cough. Patient denied any fever, chills, nausea and vomiting. Family reports sick contact 1 week prior to admission. Initial chest xray showed right upper lobe airspace disease. Zithromax, Ceftriaxone, and prednisone   for CAP> no better so PCCM was ask to evaluate the patient on 1/23.   Cultures: Sputum 1/16>>> few gpc's chains/pairs/yeast > nl flora     Antibiotics: Zithromax 1/15>>> 1/18 Ceftriaxone 1/15>>> 1/19 Avelox PO 1/19>>> 1/23 Vancomycin 1/21(?HCAP)> 1/24  Cefepime 1/23(?HCAP)> 1/24 Levaquin 1/23(?HCAP)>>>  Tests / Events:  Subjective/Interval events: Continues to not feel any better. Complains of not sleeping at night.   Vital Signs: Temp:  [97.7 F (36.5 C)-98 F (36.7 C)] 98 F (36.7 C) (01/24 0607) Pulse Rate:  [70-76] 76  (01/24 0607) Resp:  [18] 18  (01/24 0607) BP: (116-136)/(64-65) 136/64 mmHg (01/24 0607) SpO2:  [89 %-93 %] 93 % (01/24 0818) Weight:  [82.6 kg (182 lb 1.6 oz)] 82.6 kg (182 lb 1.6 oz) (01/24 4098)      Intake/Output Summary (Last 24 hours) at 04/28/11 1426 Last data filed at 04/28/11 0847  Gross per 24 hour  Intake   1148 ml  Output   1572 ml  Net   -424 ml    Physical Examination: General:  No acute distress Neuro:  Alert and oriented HEENT:  Moist mucus membranes Neck:  No JVD Cardiovascular:  RRR Lungs:  Crackles in bases, otherwise coarse Abdomen:  Soft, non tender, +BS Musculoskeletal: No edema     Labs and Imaging:   Lab 04/28/11 0420 04/27/11 0418 04/26/11 0410  NA 136 138 140  K 4.8 4.3 4.6  CL 101 101 101  CO2 28 31 33*  BUN 40* 43* 46*  CREATININE 1.96* 2.16* 2.03*  GLUCOSE 146* 91 95    Lab 04/28/11 0420  04/27/11 0418 04/24/11 0455  HGB 11.6* 11.5* 11.8*  HCT 36.6* 36.7* 36.8*  WBC 9.5 8.6 8.7  PLT 247 236 243   CXR 1/24: persistent airspace disease in right upper lobe, mild improvement in left lower lobe.  Assessment and Plan:  Progressive bilateral pulmonary infiltrates- DDX Post Viral pneumonitis vs. Amiodarone lung toxicity vs. CAP with Resistant organism- Despite antibiotic coverage CXR continues to look worse. And patient does not report feeling better  Lab 04/28/11 0420 04/27/11 0418  PROCALCITON 0.28 0.42   Erythrocyte Sedimentation Rate     Component Value Date/Time   ESRSEDRATE 44* 04/28/2011 0420   Plan: -Continue to stop Amiodarone, can consult cards if patient goes into a-fib -Solumedrol IV  -monitor ESR, and procalcitonin -continue to hold lasix -continue Levaquin > wants to go home 1/25   Chronic Kidney Disease Lab Results  Component Value Date   CREATININE 1.96* 04/28/2011   CREATININE 2.16* 04/27/2011   CREATININE 2.03* 04/26/2011   Plan: -monitor -hold lasix  Best practices / Disposition: -->Floor status PCCM -->DNR -->Heparin for DVT Px -->Protonix for GI Px -->diet regular -->family updated at bedside    BABCOCK,PETE 04/28/2011, 2:26 PM  Pt independently  seen and examined and available cxr's reviewed and I agree with above findings/ imp/ plan  discussed with fm at bedside re tentative discharge 1/25 p recheck cxr and sats  on 2lpm (his baseline)  Sandrea Hughs, MD Pulmonary and Critical Care Medicine [facility] Cell [phone]

## 2011-04-29 ENCOUNTER — Inpatient Hospital Stay (HOSPITAL_COMMUNITY): Payer: Medicare HMO

## 2011-04-29 LAB — CBC
MCH: 29.6 pg (ref 26.0–34.0)
MCV: 93.1 fL (ref 78.0–100.0)
Platelets: 247 10*3/uL (ref 150–400)
RBC: 4.22 MIL/uL (ref 4.22–5.81)
RDW: 17.4 % — ABNORMAL HIGH (ref 11.5–15.5)
WBC: 11.8 10*3/uL — ABNORMAL HIGH (ref 4.0–10.5)

## 2011-04-29 LAB — BASIC METABOLIC PANEL
CO2: 28 mEq/L (ref 19–32)
Calcium: 8.7 mg/dL (ref 8.4–10.5)
Creatinine, Ser: 1.83 mg/dL — ABNORMAL HIGH (ref 0.50–1.35)
GFR calc Af Amer: 36 mL/min — ABNORMAL LOW (ref >90)
Sodium: 141 mEq/L (ref 135–145)

## 2011-04-29 LAB — PROTIME-INR: Prothrombin Time: 18.9 seconds — ABNORMAL HIGH (ref 11.6–15.2)

## 2011-04-29 MED ORDER — ZOLPIDEM TARTRATE 5 MG PO TABS: 5.0000 mg | ORAL_TABLET | Freq: Every day | ORAL | Status: DC

## 2011-04-29 MED ORDER — WARFARIN SODIUM 4 MG PO TABS
4.0000 mg | ORAL_TABLET | Freq: Once | ORAL | Status: DC
  Filled 2011-04-29: qty 1

## 2011-04-29 MED ORDER — LEVOFLOXACIN 750 MG PO TABS: 750.0000 mg | ORAL_TABLET | Freq: Every day | ORAL | Status: DC

## 2011-04-29 MED ORDER — PREDNISONE 10 MG PO TABS: ORAL_TABLET | ORAL | Status: DC

## 2011-04-29 NOTE — Progress Notes (Signed)
ANTICOAGULATION CONSULT NOTE - Follow Up Consult  Pharmacy Consult for Warfarin Indication: Atrial fibrillation  Allergies  Allergen Reactions  . Penicillins     Patient Measurements: Height: 5\' 11"  (180.3 cm) Weight: 185 lb 10 oz (84.2 kg) IBW/kg (Calculated) : 75.3    Vital Signs: Temp: 97.2 F (36.2 C) (01/25 0607) Temp src: Oral (01/25 0607) BP: 157/82 mmHg (01/25 0607) Pulse Rate: 86  (01/25 0607)  Labs:  Basename 04/29/11 0415 04/28/11 0420 04/27/11 0418  HGB 12.5* 11.6* --  HCT 39.3 36.6* 36.7*  PLT 247 247 236  APTT -- -- --  LABPROT 18.9* 18.2* 19.7*  INR 1.55* 1.48 1.64*  HEPARINUNFRC -- -- --  CREATININE 1.83* 1.96* 2.16*  CKTOTAL -- -- --  CKMB -- -- --  TROPONINI -- -- --   Estimated Creatinine Clearance: 29.7 ml/min (by C-G formula based on Cr of 1.83).   Medications:  Scheduled:     . aspirin  81 mg Oral Daily  . budesonide-formoterol  2 puff Inhalation BID  . levofloxacin (LEVAQUIN) IV  750 mg Intravenous Q48H  . methylPREDNISolone (SOLU-MEDROL) injection  80 mg Intravenous Q12H  . metoprolol  50 mg Oral BID  . pantoprazole  40 mg Oral Daily  . sertraline  12.5 mg Oral Daily  . sodium chloride  3 mL Intravenous Q12H  . warfarin  4 mg Oral ONCE-1800  . zolpidem  5 mg Oral QHS  . DISCONTD: ceFEPime (MAXIPIME) IV  1 g Intravenous Q24H  . DISCONTD: vancomycin  1,000 mg Intravenous Q24H   Infusions:   PRN: colchicine, diphenhydrAMINE, docusate sodium, levalbuterol, senna, sodium chloride, traMADol, DISCONTD: zolpidem  Assessment: 78 year old male on Coumadin prior to admission for history of atrial fibrillation. INR starting to trend back up after warfarin had been held for 4 days (1/16-1/19) due to supratherapeutic INR. Have been using conservative warfarin dosing given recent prolonged INR >3 and drug interactions (see below). Will use higher dose of warfarin tonight.  Home dose: 3.75 mg Daily except for 2.5 mg on Thursdays   Drug  interaction with amiodarone noted (stopped 1/23, but will still have effect on INR). Drug interaction with levaquin - switched from moxifloxacin to levaquin on 1/23- can cause INR to increase.  Goal of Therapy:  INR 2-3   Plan:  1.) Repeat Coumadin 4mg  po x 1 tonight 2.) Follow up am INR   Annia Belt PharmD Pager: [phone] 7:35 AM 04/29/2011

## 2011-04-29 NOTE — Discharge Summary (Signed)
Physician Discharge Summary  Patient ID: Adam [name] MRN: [id number] DOB/AGE: [birth year] 76 y.o.  Admit date: 04/19/2011 Discharge date: 04/29/2011  Admission Diagnoses: Acute on chronic respiratory failure  Discharge Diagnoses:  Principal Problem:  *Acute-on-chronic respiratory failure Active Problems:  HYPERLIPIDEMIA  HYPERTENSION  CAD  AORTIC STENOSIS  ATRIAL FIBRILLATION, PAROXYSMAL  COPD  Chronic kidney disease (CKD), stage IV (severe)  OSTEOARTHRITIS  ARTHRITIS  LUNG CANCER, HX OF  BENIGN PROSTATIC HYPERTROPHY, HX OF  Long term current use of anticoagulant  Pacemaker  Community acquired pneumonia  Leukocytosis   Cultures:  Sputum 1/16>>> few gpc's chains/pairs/yeast > nl flora   Antibiotics:  Zithromax 1/15>>> 1/18  Ceftriaxone 1/15>>> 1/19  Avelox PO 1/19>>> 1/23  Vancomycin 1/21(?HCAP)> 1/24  Cefepime 1/23(?HCAP)> 1/24  Levaquin 1/23(?HCAP)>>>   Tests / Events:    History of Present Illness:  42 yowm with history of lung cancer, status post left lower lobectomy (1991) who is chronically on 2 L [place] Admit 1/15 for increase in SOB, and minimally productive cough. Patient denied any fever, chills, nausea and vomiting. Family reports sick contact 1 week prior to admission. Initial chest xray showed right upper lobe airspace disease. Zithromax, Ceftriaxone, and prednisone for CAP> no better so PCCM was ask to evaluate the patient on 1/23.   Hospital Course:  Progressive bilateral pulmonary infiltrates- DDX Post Viral pneumonitis vs. Amiodarone lung toxicity vs. CAP with Resistant organism- Despite antibiotic coverage CXR continued to look worse. Started on high does steroid trial on 1/23. CXR stabilized. Clinically feeling much improved.   Lab  04/28/11 0420  04/27/11 0418   PROCALCITON  0.28  0.42    Erythrocyte Sedimentation Rate   Lab 04/28/11 0420 04/27/11 0418  ESRSEDRATE 44* 38*  Plan:  -Continue to stop Amiodarone, can consult cards if patient goes  into a-fib  -slow prednisone taper -resume home lasix dosing -continue Levaquin, will complete 10 d for possible CAP (NOS)  Chronic Kidney Disease: Scr bumped with aggressive IV diuresis. Now improved after backing off.  Recent Labs  Basename 04/29/11 0415 04/28/11 0420 04/27/11 0418   CREATININE 1.83* 1.96* 2.16*  plan: -resume home diuretic dosing  History of atrial Fib, now NSR Lab Results  Component Value Date   INR 1.55* 04/29/2011   INR 1.48 04/28/2011   INR 1.64* 04/27/2011  Plan: -home on coumadin.  -no more amiodarone  COPD: -home on Oxygen and symbicort  HYPERLIPIDEMIA,  HYPERTENSION,CAD, AORTIC STENOSIS Plan: -cont home rx  Discharge Exam: Temp:  [97.2 F (36.2 C)-98.3 F (36.8 C)] 97.2 F (36.2 C) (01/25 0607) Pulse Rate:  [68-86] 86  (01/25 0607) Resp:  [18] 18  (01/25 0607) BP: (109-157)/(53-82) 157/82 mmHg (01/25 0607) SpO2:  [92 %-96 %] 94 % (01/25 0822) Weight:  [84.2 kg (185 lb 10 oz)] 84.2 kg (185 lb 10 oz) (01/25 0500)  Physical Examination:  General: No acute distress  Neuro: Alert and oriented  HEENT: Moist mucus membranes  Neck: No JVD  Cardiovascular: RRR  Lungs: Crackles in bases, otherwise clear Abdomen: Soft, non tender, +BS  Musculoskeletal: No edema   Labs at discharge Lab Results  Component Value Date   CREATININE 1.83* 04/29/2011   BUN 40* 04/29/2011   NA 141 04/29/2011   K 5.1 04/29/2011   CL 104 04/29/2011   CO2 28 04/29/2011   Lab Results  Component Value Date   WBC 11.8* 04/29/2011   HGB 12.5* 04/29/2011   HCT 39.3 04/29/2011   MCV 93.1 04/29/2011  PLT 247 04/29/2011   Lab Results  Component Value Date   ALT 10 04/09/2010   AST 14 04/09/2010   ALKPHOS 116 02/10/2010   BILITOT 0.4 02/10/2010   Lab Results  Component Value Date   INR 1.55* 04/29/2011   INR 1.48 04/28/2011   INR 1.64* 04/27/2011    Current radiology studies Dg Chest 2 View  04/29/2011  *RADIOLOGY REPORT*  Clinical Data: Shortness of breath.  CHEST - 2 VIEW   Comparison: Chest x-ray 04/29/2011.  Findings: There continues to be dense consolidation in the right upper lobe, with upper retraction of the minor fissure.  Diffuse interstitial prominence and patchy opacities throughout the left mid and lower lung are also similar compared to the prior study. No definite pleural effusions.  Mild - moderate cardiomegaly unchanged. The patient is rotated to the left on today's exam, resulting in distortion of the mediastinal contours and reduced diagnostic sensitivity and specificity for mediastinal pathology. Dual lead pacemaker device with leads projecting over the expected location of the right atrium and right ventricular apex again noted.  Status post median sternotomy for CABG with LIMA and aortic valve replacement (stented bioprosthesis).  IMPRESSION:  1.  There continue be patchy opacities throughout the left mid and lower lung, and particularly the right upper lobe, consistent with multilobar pneumonia.  In addition, elevation of the minor fissure is indicative of some component of right upper lobe atelectasis.  Original Report Authenticated By: Florencia Reasons, M.D.   Dg Chest Port 1 View  04/29/2011  *RADIOLOGY REPORT*  Clinical Data: Shortness of breath.  PORTABLE CHEST - 1 VIEW  Comparison: 04/28/2011.  Findings: Exam is respiratory motion degraded.  Sequential pacemaker enters from the left with leads unchanged in position.  Cardiomegaly.  Mildly tortuous aorta.  Poor inspiration with elevated right hemidiaphragm.  Asymmetric air space disease most notable right upper lobe and left perihilar region/left lung base.  This may represent infiltrate and / or asymmetric pulmonary edema.  Degree of consolidation right upper lung appears slightly more prominent which may be related to differences in technique.  IMPRESSION: Asymmetric air space disease appears slightly more prominent in the right upper lung.  This may be related to differences in technique. Please see  above.  Original Report Authenticated By: Fuller Canada, M.D.   Dg Chest Port 1 View  04/28/2011  *RADIOLOGY REPORT*  Clinical Data: Respiratory distress.  "Check endotracheal tube"  PORTABLE CHEST - 1 VIEW  Comparison: 04/27/2011  Findings: The patient's chin and face project over the upper chest and obscure portions of the upper chest.  No endotracheal tube is visible although this could be due to the obscuration of the patient's facial tissues in conjunction with the prominent motion artifact on today's exam.  Repeat radiography may be warranted.  Dual lead pacer noted.  Postoperative findings from prior CABG and prior aortic valve replacement noted.  Continued right upper lobe airspace opacity is partially obscured by the patient's facial tissues.  There is improved aeration in the lingula.  Low lung volumes are present, causing crowding of the pulmonary vasculature.  Underlying interstitial accentuation is present bilaterally  IMPRESSION:  1.  Reduced sensitivity and specificity of today's exam is due to considerable motion artifact and the patient's chin and facial tissues projecting over the upper chest. 2.  No endotracheal tube is visualized. 3.  Improved aeration in the lingula.  Continued airspace opacity in the right upper lobe. 4.  Continued underlying interstitial accentuation.  Original  Report Authenticated By: Dellia Cloud, M.D.    Disposition:  ED Dismiss - Never Arrived  Discharge Orders    Future Appointments: Provider: Department: Dept Phone: Center:   05/03/2011 11:15 AM Rubye Oaks, NP [facility] [phone] None   05/04/2011 12:15 PM Tiffany Daneil Dan, RN [facility] [phone] None   06/15/2011 2:00 PM Vella Kohler [facility] [phone] [facility]     Future Orders Please Complete By Expires   Diet - low sodium heart healthy      Increase activity slowly      Discharge instructions      Comments:   Continue oxygen at 4 liters, we will  evaluate you for decreasing it when we see you in office.       Medication List  As of 04/29/2011  1:58 PM   STOP taking these medications         amiodarone 200 MG tablet         TAKE these medications         acetaminophen 650 MG CR tablet   Commonly known as: TYLENOL   Take 1,950 mg by mouth daily as needed. For arthritis.      aspirin 81 MG tablet   Take 81 mg by mouth daily.      budesonide-formoterol 160-4.5 MCG/ACT inhaler   Commonly known as: SYMBICORT   Inhale 2 puffs into the lungs 2 (two) times daily.      colchicine 0.6 MG tablet   Take 1 tablet (0.6 mg total) by mouth 2 (two) times daily as needed.      diphenhydrAMINE 25 MG tablet   Commonly known as: BENADRYL   Take 25 mg by mouth every 6 (six) hours as needed. For sleep.      docusate sodium 100 MG capsule   Commonly known as: COLACE   Take 100 mg by mouth 2 (two) times daily as needed. For constipation.      furosemide 40 MG tablet   Commonly known as: LASIX   Take 1 tablet (40 mg total) by mouth daily.      lansoprazole 30 MG capsule   Commonly known as: PREVACID   Take 1 capsule (30 mg total) by mouth 2 (two) times daily.      levofloxacin 750 MG tablet   Commonly known as: LEVAQUIN   Take 1 tablet (750 mg total) by mouth daily.      meloxicam 7.5 MG tablet   Commonly known as: MOBIC   Take 7.5 mg by mouth daily as needed. For pain.      metoprolol 50 MG tablet   Commonly known as: LOPRESSOR   Take 1 tablet (50 mg total) by mouth 2 (two) times daily.      predniSONE 10 MG tablet   Commonly known as: DELTASONE   Take 6 tabs daily for 5 days, then 5 tabs, daily for 5 days, then 4 tabs daily for 5 days, then 3 tabs daily for 5 days, then 2 tabs daily for 5 days, then 1 tab daily for 5 days      senna 8.6 MG tablet   Commonly known as: SENOKOT   Take 1 tablet by mouth daily as needed. For constipation.      sertraline 25 MG tablet   Commonly known as: ZOLOFT   Take 12.5 mg by mouth daily.       warfarin 2.5 MG tablet   Commonly known as: COUMADIN   Take 2.5-3.75 mg by  mouth daily. 1.5 tab daily except 1 tab on Thurs      XOPENEX HFA 45 MCG/ACT inhaler   Generic drug: levalbuterol   Inhale 1-2 puffs into the lungs every 6 (six) hours as needed. For shortness of breath.      zolpidem 5 MG tablet   Commonly known as: AMBIEN   Take 1 tablet (5 mg total) by mouth at bedtime.           Follow-up Information    Follow up with PARRETT,TAMMY, NP on 05/03/2011. (at 11am)    Contact information:   [facility] [address] [place] [address] [phone]       Follow up with [facility] on 05/04/2011. (at 1215)    Contact information:   [address] [place] [address] [phone]         Discharged Condition: good  Signed: BABCOCK,PETE 04/29/2011, 1:58 PM  Pt independently  seen and examined and available cxr's reviewed and I agree with above findings/ imp/ plan  Discussed with family that we don't have a specific dx here and that treatment would be empric and will need cardiology f/u as well for alternative rx to amiodarone in this setting.  Sandrea Hughs, MD Pulmonary and Critical Care Medicine [facility] Cell [phone]

## 2011-05-03 ENCOUNTER — Inpatient Hospital Stay: Payer: Medicare HMO | Admitting: Adult Health

## 2011-05-04 ENCOUNTER — Encounter: Payer: Medicare HMO | Admitting: *Deleted

## 2011-05-05 ENCOUNTER — Ambulatory Visit (INDEPENDENT_AMBULATORY_CARE_PROVIDER_SITE_OTHER): Payer: Medicare HMO | Admitting: *Deleted

## 2011-05-05 DIAGNOSIS — Z7901 Long term (current) use of anticoagulants: Secondary | ICD-10-CM

## 2011-05-05 DIAGNOSIS — I4891 Unspecified atrial fibrillation: Secondary | ICD-10-CM

## 2011-05-05 LAB — POCT INR: INR: 3.9

## 2011-05-06 ENCOUNTER — Inpatient Hospital Stay: Payer: Medicare HMO | Admitting: Adult Health

## 2011-05-09 ENCOUNTER — Ambulatory Visit (INDEPENDENT_AMBULATORY_CARE_PROVIDER_SITE_OTHER)
Admission: RE | Admit: 2011-05-09 | Discharge: 2011-05-09 | Disposition: A | Discharge status: Home / Self Care | Payer: Medicare HMO | Source: Ambulatory Visit | Attending: Adult Health | Admitting: Adult Health

## 2011-05-09 ENCOUNTER — Encounter: Payer: Self-pay | Admitting: Adult Health

## 2011-05-09 ENCOUNTER — Ambulatory Visit (INDEPENDENT_AMBULATORY_CARE_PROVIDER_SITE_OTHER): Payer: Medicare HMO | Admitting: Adult Health

## 2011-05-09 VITALS — BP 114/62 | HR 71 | Temp 96.7°F | Ht 71.5 in | Wt 187.0 lb

## 2011-05-09 DIAGNOSIS — J8410 Pulmonary fibrosis, unspecified: Secondary | ICD-10-CM

## 2011-05-09 DIAGNOSIS — J189 Pneumonia, unspecified organism: Secondary | ICD-10-CM

## 2011-05-09 MED ORDER — LEVALBUTEROL TARTRATE 45 MCG/ACT IN AERO: 1.0000 | INHALATION_SPRAY | Freq: Four times a day (QID) | RESPIRATORY_TRACT | Status: DC | PRN

## 2011-05-09 MED ORDER — BUDESONIDE-FORMOTEROL FUMARATE 160-4.5 MCG/ACT IN AERO: 2.0000 | INHALATION_SPRAY | Freq: Two times a day (BID) | RESPIRATORY_TRACT | Status: DC

## 2011-05-09 NOTE — Patient Instructions (Signed)
Taper prednisone as directed.  follow up Dr. Delton Coombes  In 3 weeks with chest xray  Follow up Dr. Eden Emms in next 1-2 weeks to discuss Amiodarone.  Continue on Symbiocrt 2 puffs Twice daily  . Oxygen 2 l/m at rest and 4 l/m with activity.

## 2011-05-09 NOTE — Progress Notes (Signed)
Subjective:    Patient ID: Adam [name], male    DOB: [birth year], 78 y.o.   MRN: [id number]  HPI  78 yo man, former smoker and hx of asbestos exposure, Hx of lung CA s/p LLL lobectomy in 1991, CAD and AV disease s/p CABG/AVR in 04/2009. He also has Afib s/p pacer and successful cardioversion on June 3. He has been on home O2 since his SGY, imaging has shown ? ILD. He also had COPD documented prior to SGY.   ROV 11/26/09 -- returns for f/u of multifactorial SOB, with COPD and restriction post lobectomy as contributors. Since last visit has had CT scan of the chest - no ILD, some pleural thickening, some emphysematous changes. We did trial of Spiriva, only took it for 3 days due to dizziness. He thinks his breathing did benefit some while he was on the med. Has been doing IS more frequently.   ROV 01/27/10 -- COPD and restrictive lung dz (post-LLlobectomy). Last time we agreed to go back on Spiriva, he took for 3 days and stopped because it didnt help. He still feels that his exertional tolerance is limited. He tried xopenex 3 or 4 times, didn't change his breathing significantly.   ROV 04/27/10 -- COPD + restrictive lung dz post LLlobectomy (no ILD on CT scan). Didn't like Spiriva. Last time we started Symbicort two times a day - he feels better on this, but not much. Since he has been on it, has had more throat congestion and scratchiness. He does benefit from his as needed SABA. Today is desaturated on RA to 79%, recovered to 94% on 2L/min. He has felt better since he started using O2 at night, isn't using it during the day or with exertion.   05/09/2011 Post Hospital follow up  Pt returns for follow up . Admitted 1/15-1/25 for Progressive bilateral pulmonary infiltrates- Post Viral pneumonitis vs. Amiodarone lung toxicity vs. CAP with Resistant organism- Despite antibiotic coverage CXR continued to look worse. Started on high does steroid trial on 1/23. CXR stabilized.  Responded well to steroids and  discharged on Avelox and steroid taper. Taken off Amiodarone.  Since discharge he is feeling better. Has couple of weeks of  Prednisone left . Has finished Avelox.  CXR showed improved /decreased infiltrate on right.  He is feeling much better. Decreased cough /congestion.   Review of Systems Constitutional:   No  weight loss, night sweats,  Fevers, chills,  +fatigue, or  lassitude.  HEENT:   No headaches,  Difficulty swallowing,  Tooth/dental problems, or  Sore throat,                No sneezing, itching, ear ache, nasal congestion, post nasal drip,   CV:  No chest pain,  Orthopnea, PND, swelling in lower extremities, anasarca, dizziness, palpitations, syncope.   GI  No heartburn, indigestion, abdominal pain, nausea, vomiting, diarrhea, change in bowel habits, loss of appetite, bloody stools.   Resp:    No excess mucus, no productive cough,  No non-productive cough,  No coughing up of blood.  No change in color of mucus.  No wheezing.  No chest wall deformity  Skin: no rash or lesions.  GU: no dysuria, change in color of urine, no urgency or frequency.  No flank pain, no hematuria   MS:  No joint pain or swelling.  No decreased range of motion.  No back pain.  Psych:  No change in mood or affect. No depression or anxiety.  No  memory loss.         Objective:   Physical Exam GEN: A/Ox3; pleasant , NAD, elderly ,in wheelchair  HEENT:  [place]/AT,  EACs-clear, TMs-wnl, NOSE-clear, THROAT-clear, no lesions, no postnasal drip or exudate noted.   NECK:  Supple w/ fair ROM; no JVD; normal carotid impulses w/o bruits; no thyromegaly or nodules palpated; no lymphadenopathy.  RESP  Coarse BS  W/o wheezes/ rales/ or rhonchi.no accessory muscle use, no dullness to percussion  CARD:  RRR, no m/r/g  , no peripheral edema, pulses intact, no cyanosis or clubbing.  GI:   Soft & nt; nml bowel sounds; no organomegaly or masses detected.  Musco: Warm bil, no deformities or joint swelling noted.     Neuro: alert, no focal deficits noted.    Skin: Warm, no lesions or rashes         Assessment & Plan:

## 2011-05-09 NOTE — Assessment & Plan Note (Signed)
Recent admission with CAP vs Pnuemonitis ? amoiodarone toxicity.  Improved with steroid challenge along broad spectrum abx.  cxr today is improved and clinically he is improved   Plan;  Taper prednisone as directed.  follow up Dr. Delton Coombes  In 3 weeks with chest xray  Follow up Dr. Eden Emms in next 1-2 weeks to discuss Amiodarone.  Continue on Symbiocrt 2 puffs Twice daily  . Oxygen 2 l/m at rest and 4 l/m with activity.

## 2011-05-09 NOTE — Assessment & Plan Note (Deleted)
Flare off steroids ? Underlying PNA Will check xray today  xopenex neb in office today   Plan:  Taper prednisone as directed.  follow up Dr. Delton Coombes  In 3 weeks with chest xray  Follow up Dr. Eden Emms in next 1-2 weeks to discuss Amiodarone.  Continue on Symbiocrt 2 puffs Twice daily  . Oxygen 2 l/m at rest and 4 l/m with activity.

## 2011-05-11 DIAGNOSIS — J189 Pneumonia, unspecified organism: Secondary | ICD-10-CM

## 2011-05-11 DIAGNOSIS — I2510 Atherosclerotic heart disease of native coronary artery without angina pectoris: Secondary | ICD-10-CM

## 2011-05-11 DIAGNOSIS — J449 Chronic obstructive pulmonary disease, unspecified: Secondary | ICD-10-CM

## 2011-05-11 DIAGNOSIS — Z5189 Encounter for other specified aftercare: Secondary | ICD-10-CM

## 2011-05-12 ENCOUNTER — Other Ambulatory Visit: Payer: Self-pay | Admitting: Cardiovascular Disease

## 2011-05-12 MED ORDER — WARFARIN SODIUM 2.5 MG PO TABS: ORAL_TABLET | ORAL | Status: DC

## 2011-05-16 ENCOUNTER — Ambulatory Visit (INDEPENDENT_AMBULATORY_CARE_PROVIDER_SITE_OTHER): Payer: Medicare HMO | Admitting: Physician Assistant

## 2011-05-16 ENCOUNTER — Ambulatory Visit: Payer: Medicare HMO | Admitting: Physician Assistant

## 2011-05-16 ENCOUNTER — Encounter: Payer: Medicare HMO | Admitting: *Deleted

## 2011-05-16 ENCOUNTER — Ambulatory Visit (INDEPENDENT_AMBULATORY_CARE_PROVIDER_SITE_OTHER): Payer: Medicare HMO | Admitting: *Deleted

## 2011-05-16 ENCOUNTER — Encounter: Payer: Self-pay | Admitting: Physician Assistant

## 2011-05-16 DIAGNOSIS — I2510 Atherosclerotic heart disease of native coronary artery without angina pectoris: Secondary | ICD-10-CM

## 2011-05-16 DIAGNOSIS — I4891 Unspecified atrial fibrillation: Secondary | ICD-10-CM

## 2011-05-16 DIAGNOSIS — Z7901 Long term (current) use of anticoagulants: Secondary | ICD-10-CM

## 2011-05-16 MED ORDER — AMIODARONE HCL 200 MG PO TABS: 200.0000 mg | ORAL_TABLET | Freq: Two times a day (BID) | ORAL | Status: DC

## 2011-05-16 NOTE — Progress Notes (Signed)
HPI:   This is an elderly 78 year old white male patient of Dr. Glory Rosebush who has a history of coronary artery disease status post CABG and aVR in 2011. He also has history of atrial fibrillation status post pacer in successful cardioversion on September 05, 2010 and has been maintained on amiodarone. He does not tolerate atrial fib very well. He also has a history of diastolic dysfunction.  The patient has a history of lung cancer status post left lower lobectomy in 1991. He has significant COPD and is on home oxygen. He recently had a hospitalization for progressive bilateral pulmonary infiltrates. They didn't know if this was post viral pneumonitis versus amiodarone lung toxicity versus CAP with resistant organism. He was treated with antibiotics and high-dose steroids and improved. They did stop his amiodarone for possible toxicity and had him schedule a followup with our office.  The patient is feeling much better from a pulmonary standpoint. He is still in normal sinus rhythm but has a potential to go back into atrial fibrillation which he does not tolerate.   Allergies  Allergen Reactions  . Penicillins     Current Outpatient Prescriptions on File Prior to Visit  Medication Sig Dispense Refill  . acetaminophen (TYLENOL) 650 MG CR tablet Take 1,950 mg by mouth daily as needed. For arthritis.      Marland Kitchen aspirin 81 MG tablet Take 81 mg by mouth daily.        . budesonide-formoterol (SYMBICORT) 160-4.5 MCG/ACT inhaler Inhale 2 puffs into the lungs 2 (two) times daily.  1 Inhaler  5  . colchicine 0.6 MG tablet Take 1 tablet (0.6 mg total) by mouth 2 (two) times daily as needed.  60 tablet  1  . diphenhydrAMINE (BENADRYL) 25 MG tablet Take 25 mg by mouth every 6 (six) hours as needed. For sleep.      Marland Kitchen docusate sodium (COLACE) 100 MG capsule Take 100 mg by mouth 2 (two) times daily as needed. For constipation.      . furosemide (LASIX) 40 MG tablet Take 1 tablet (40 mg total) by mouth daily.  30 tablet   10  . lansoprazole (PREVACID) 30 MG capsule Take 1 capsule (30 mg total) by mouth 2 (two) times daily.  60 capsule  11  . levalbuterol (XOPENEX HFA) 45 MCG/ACT inhaler Inhale 1-2 puffs into the lungs every 6 (six) hours as needed. For shortness of breath.  1 Inhaler  5  . metoprolol (LOPRESSOR) 50 MG tablet Take 1 tablet (50 mg total) by mouth 2 (two) times daily.  60 tablet  11  . predniSONE (DELTASONE) 10 MG tablet Take 6 tabs daily for 5 days, then 5 tabs, daily for 5 days, then 4 tabs daily for 5 days, then 3 tabs daily for 5 days, then 2 tabs daily for 5 days, then 1 tab daily for 5 days  105 tablet  0  . senna (SENOKOT) 8.6 MG tablet Take 1 tablet by mouth daily as needed. For constipation.      . sertraline (ZOLOFT) 25 MG tablet Take 12.5 mg by mouth daily.      Marland Kitchen warfarin (COUMADIN) 2.5 MG tablet Take as directed by the Anticoagulation Clinic  45 tablet  3  . zolpidem (AMBIEN) 5 MG tablet Take 1 tablet (5 mg total) by mouth at bedtime.  30 tablet  0  . meloxicam (MOBIC) 7.5 MG tablet Take 7.5 mg by mouth daily as needed. For pain.        Past  Medical History  Diagnosis Date  . Cancer     Lung  . COPD (chronic obstructive pulmonary disease)   . Diverticulosis of colon   . GERD (gastroesophageal reflux disease)     Stricture  . Hyperlipidemia   . Hypertension   . Esophageal dysmotility   . Osteoarthritis   . Aortic stenosis     s/p AVR CABG  . Mild renal insufficiency   . Erectile dysfunction   . Venous insufficiency   . Gout   . BPH (benign prostatic hypertrophy)   . Atrioventricular block, complete     cx cabg/AVR  . Pacemaker     st judes    Past Surgical History  Procedure Date  . Aortic valve replacement 05/04/09    with CABG x 3  . Pacemaker insertion 05/08/09    [employer], dual chamber  . Cholecystectomy   . Tonsillectomy   . Lobectomy 1991    Lung  . Prostate surgery 1990  . Thumb surgery     Left  . Varicose vein surgery   . Basal cell surgery      Nose  . Esophagogastroduodenoscopy 10/2005    Stricture  . Cardiolite 11/2003    Neg.  Mild to moderate AS  . Carotid US 11/2005    Neg  . Esophagogastroduodenoscopy 07/2004    Gastritis, stricture, dysmotility  . Rib films 08/2005    Right 10th rib fracture  . Doppler echocardiography 09/2005    Stable  . 2d echocardiogram 06/2007    Mod aortic stenosis with EF 55%  . Colonoscopy     diverticulosis, small polyp, hemorrhoids    Family History  Problem Relation Age of Onset  . Cancer Mother     Pancreatic  . Heart disease Father     CAD  . Cancer Sister     ? abdominal CA  . Cancer Brother     Colon  . Heart disease Maternal Grandmother     History   Social History  . Marital Status: Married    Spouse Name: N/A    Number of Children: N/A  . Years of Education: N/A   Occupational History  . Retired Forensic scientist    Social History Main Topics  . Smoking status: Former Smoker -- 40 years    Types: Cigarettes    Quit date: 04/04/1989  . Smokeless tobacco: Never Used  . Alcohol Use: No  . Drug Use: No  . Sexually Active: Not on file   Other Topics Concern  . Not on file   Social History Narrative   Caffeine:  Daily, coffee at breakfast.Exercise:  GolfPositive asbestos exposure in the [employer].    ROS:see history of present illness, where his oxygen, arthritic,heart of hearing   PHYSICAL EXAM: Elderly, in no acute distress. Neck: No JVD, HJR, Bruit, or thyroid enlargement Lungs: Decreased rest sounds throughout, otherwise clear without wheezing, rales, or rhonchi Cardiovascular: RRR, PMI not displaced,1-2/6 systolic murmur at the left sternal border and apex, no gallops, bruit, thrill, or heave. Abdomen: BS normal. Soft without organomegaly, masses, lesions or tenderness. Extremities: without cyanosis, clubbing or edema. Good distal pulses bilateral SKin: Warm, no lesions or rashes  Musculoskeletal: No deformities Neuro: no focal signs  BP 108/70   Pulse 64  Ht 5\' 11"  (1.803 m)  Wt 186 lb (84.369 kg)  BMI 25.94 kg/m2

## 2011-05-16 NOTE — Assessment & Plan Note (Signed)
Patient has a history of paroxysmal atrial fibrillation and underwent cardioversion in June 2012 and has maintained normal sinus rhythm on amiodarone. He does not tolerate atrial fibrillation. His amiodarone was stopped because of possible amiodarone toxicity but he has significant lung disease and felt he also could have had a post viral pneumonitis versus CAP with resistant organism.  I discussed this patient in detail with Dr. Eden Emms and Dr. Johney Frame to discuss possible Multaq as a drug for him. This was not started because of abnormal renal function. He is also not had an echo is 2011 at which time his EF was 50%. Because of this we will resume his amiodarone at 400 mg daily. He will follow up with Dr. Eden Emms in a couple weeks.

## 2011-05-16 NOTE — Assessment & Plan Note (Signed)
Stable

## 2011-05-16 NOTE — Patient Instructions (Signed)
Your physician has recommended you make the following change in your medication: RESTART Amiodarone 200 mg, one tablet twice daily   Your physician recommends that you schedule a follow-up appointment in: 2-3 weeks with Dr. Eden Emms.

## 2011-05-19 ENCOUNTER — Ambulatory Visit: Payer: Medicare HMO | Admitting: Family Medicine

## 2011-05-30 ENCOUNTER — Ambulatory Visit (INDEPENDENT_AMBULATORY_CARE_PROVIDER_SITE_OTHER): Payer: Medicare HMO | Admitting: *Deleted

## 2011-05-30 DIAGNOSIS — Z7901 Long term (current) use of anticoagulants: Secondary | ICD-10-CM

## 2011-05-30 DIAGNOSIS — I4891 Unspecified atrial fibrillation: Secondary | ICD-10-CM

## 2011-05-31 ENCOUNTER — Encounter: Payer: Self-pay | Admitting: Family Medicine

## 2011-05-31 ENCOUNTER — Ambulatory Visit (INDEPENDENT_AMBULATORY_CARE_PROVIDER_SITE_OTHER): Payer: Medicare HMO | Admitting: Family Medicine

## 2011-05-31 VITALS — BP 120/60 | HR 81 | Temp 97.5°F | Wt 192.0 lb

## 2011-05-31 DIAGNOSIS — K921 Melena: Secondary | ICD-10-CM

## 2011-05-31 DIAGNOSIS — N471 Phimosis: Secondary | ICD-10-CM

## 2011-05-31 DIAGNOSIS — N478 Other disorders of prepuce: Secondary | ICD-10-CM

## 2011-05-31 MED ORDER — NYSTATIN 100000 UNIT/GM EX CREA: TOPICAL_CREAM | Freq: Two times a day (BID) | CUTANEOUS | Status: AC

## 2011-05-31 NOTE — Patient Instructions (Signed)
Check the stool card next week.  Let me know if you have more bleeding.  Take care.   You can get your results through our phone system.  Follow the instructions on the blue card.

## 2011-05-31 NOTE — Progress Notes (Signed)
2 bloody bowel movements last week.  INR was elevated yesterday.  Still with fatigue.  He had PNA in 1/13.  Plan to recheck INR in another 9 days from today.  Colonoscopy done 2004, hemorrhoids and diverticulosis.  No nosebleeds. Some bruising.  No blood in urine.  Normal BM today w/o blood.   Also with irritation on penis per patient report.  Meds, vitals, and allergies reviewed.   ROS: See HPI.  Otherwise, noncontributory.  nad ncat On O2 rrr Murmur noted Coarse BS but no inc in wob abd soft Penis with phimosis but can retract foreskin External rectal exam wnl, no gross blood

## 2011-06-01 LAB — CBC WITH DIFFERENTIAL/PLATELET
Basophils Absolute: 0 10*3/uL (ref 0.0–0.1)
Eosinophils Absolute: 0 10*3/uL (ref 0.0–0.7)
Eosinophils Relative: 0.3 % (ref 0.0–5.0)
HCT: 35.1 % — ABNORMAL LOW (ref 39.0–52.0)
Lymphs Abs: 1.4 10*3/uL (ref 0.7–4.0)
MCHC: 33 g/dL (ref 30.0–36.0)
MCV: 92.4 fl (ref 78.0–100.0)
Monocytes Absolute: 0.6 10*3/uL (ref 0.1–1.0)
Neutrophils Relative %: 66.7 % (ref 43.0–77.0)
Platelets: 187 10*3/uL (ref 150.0–400.0)
RDW: 19.2 % — ABNORMAL HIGH (ref 11.5–14.6)
WBC: 6 10*3/uL (ref 4.5–10.5)

## 2011-06-02 DIAGNOSIS — N471 Phimosis: Secondary | ICD-10-CM | POA: Insufficient documentation

## 2011-06-02 DIAGNOSIS — K921 Melena: Secondary | ICD-10-CM | POA: Insufficient documentation

## 2011-06-02 NOTE — Assessment & Plan Note (Signed)
Check IFOB and CBC. If both okay, then f/u with coumadin clinic.  If either is abnormal, then refer to GI.  He agrees.  Okay for outpatient f/u.

## 2011-06-02 NOTE — Assessment & Plan Note (Signed)
Nystatin and to ER if he can't retract foreskin.  He understood.

## 2011-06-03 ENCOUNTER — Other Ambulatory Visit: Payer: Medicare HMO

## 2011-06-03 DIAGNOSIS — K921 Melena: Secondary | ICD-10-CM

## 2011-06-06 ENCOUNTER — Other Ambulatory Visit: Payer: Medicare HMO

## 2011-06-06 LAB — FECAL OCCULT BLOOD, IMMUNOCHEMICAL: Fecal Occult Bld: NEGATIVE

## 2011-06-07 ENCOUNTER — Encounter: Payer: Self-pay | Admitting: Emergency Medicine

## 2011-06-07 ENCOUNTER — Ambulatory Visit (INDEPENDENT_AMBULATORY_CARE_PROVIDER_SITE_OTHER): Payer: Medicare HMO | Admitting: Emergency Medicine

## 2011-06-07 ENCOUNTER — Ambulatory Visit (INDEPENDENT_AMBULATORY_CARE_PROVIDER_SITE_OTHER)
Admission: RE | Admit: 2011-06-07 | Discharge: 2011-06-07 | Disposition: A | Discharge status: Home / Self Care | Payer: Medicare HMO | Source: Ambulatory Visit | Attending: Emergency Medicine | Admitting: Emergency Medicine

## 2011-06-07 VITALS — BP 100/70 | HR 70 | Temp 98.0°F | Ht 71.5 in | Wt 194.2 lb

## 2011-06-07 DIAGNOSIS — J449 Chronic obstructive pulmonary disease, unspecified: Secondary | ICD-10-CM

## 2011-06-07 DIAGNOSIS — J189 Pneumonia, unspecified organism: Secondary | ICD-10-CM

## 2011-06-07 NOTE — Assessment & Plan Note (Addendum)
Not entirely clear whether his recent hospitalization was due to bacterial infxn vs viral pneumonitis vs another inflammatory process like amiodarone toxicity. His CXR is improved today 3/5, ? Due to steroids or to time. He was restarted on amio 05/16/11 -will continue amio for now, watch closely. If clinical change or if CXR changes then we will likely need to stop.  - will plan repeat CXR or CT scan in 3 months to insure stability

## 2011-06-07 NOTE — Assessment & Plan Note (Signed)
Continue symbicort bid and O2 as he is using them

## 2011-06-07 NOTE — Patient Instructions (Signed)
Continue your amiodarone as directed by Dr Eden Emms  We will repeat your CXR in 3 months to see if it has changed on the medication Continue to use Symbicort 2 puffs twice a day and xopenex when you need it We will ask [facility] to evaluate your oxygen neds while resting and while walking. We may need to turn your oxygen flow rate up when you walk.  Follow with Dr Delton Coombes in 3 months with a CXR

## 2011-06-07 NOTE — Progress Notes (Signed)
Subjective:    Patient ID: Adam [name], male    DOB: 1920-04-25, 78 y.o.   MRN: [id number]  HPI 78 yo man, former smoker and hx of asbestos exposure, Hx of lung CA s/p LLL lobectomy in 1991, CAD and AV disease s/p CABG/AVR in 04/2009. He also has Afib s/p pacer and successful cardioversion on June 3. He has been on home O2 since his SGY, imaging has shown ? ILD. He also had COPD documented prior to SGY.   ROV 11/26/09 -- returns for f/u of multifactorial SOB, with COPD and restriction post lobectomy as contributors. Since last visit has had CT scan of the chest - no ILD, some pleural thickening, some emphysematous changes. We did trial of Spiriva, only took it for 3 days due to dizziness. He thinks his breathing did benefit some while he was on the med. Has been doing IS more frequently.   ROV 01/27/10 -- COPD and restrictive lung dz (post-LLlobectomy). Last time we agreed to go back on Spiriva, he took for 3 days and stopped because it didnt help. He still feels that his exertional tolerance is limited. He tried xopenex 3 or 4 times, didn't change his breathing significantly.   ROV 04/27/10 -- COPD + restrictive lung dz post LLlobectomy (no ILD on CT scan). Didn't like Spiriva. Last time we started Symbicort two times a day - he feels better on this, but not much. Since he has been on it, has had more throat congestion and scratchiness. He does benefit from his as needed SABA. Today is desaturated on RA to 79%, recovered to 94% on 2L/min. He has felt better since he started using O2 at night, isn't using it during the day or with exertion.   Post Hospital follow up 05/09/11 Pt returns for follow up . Admitted 1/15-1/25 for Progressive bilateral pulmonary infiltrates- Post Viral pneumonitis vs. Amiodarone lung toxicity vs. CAP with Resistant organism- Despite antibiotic coverage CXR continued to look worse. Started on high does steroid trial on 1/23. CXR stabilized.  Responded well to steroids and  discharged on Avelox and steroid taper. Taken off Amiodarone.  Since discharge he is feeling better. Has couple of weeks of  Prednisone left . Has finished Avelox.  CXR showed improved /decreased infiltrate on right.  He is feeling much better. Decreased cough /congestion.   ROV 06/07/11 -- Hx COPD and restriction due to LL lobectomy for lung CA. He had hospitalization as above characterized by RUL and LUL infiltrates that were slow to resolve w abx, was rx with steroids and improved. ? amio toxicity (restarted 05/16/11), ? Viral pneumonitis. Has stayed on symbicort bid. Still limited, feels more SOB and limited since d/c from hospital.    Review of Systems Constitutional:   No  weight loss, night sweats,  Fevers, chills,  +fatigue, or  lassitude.  HEENT:   No headaches,  Difficulty swallowing,  Tooth/dental problems, or  Sore throat,                No sneezing, itching, ear ache, nasal congestion, post nasal drip,   CV:  No chest pain,  Orthopnea, PND, swelling in lower extremities, anasarca, dizziness, palpitations, syncope.   GI  No heartburn, indigestion, abdominal pain, nausea, vomiting, diarrhea, change in bowel habits, loss of appetite, bloody stools.   Resp:    No excess mucus, no productive cough,  No non-productive cough,  No coughing up of blood.  No change in color of mucus.  No wheezing.  No chest wall deformity  Skin: no rash or lesions.  GU: no dysuria, change in color of urine, no urgency or frequency.  No flank pain, no hematuria   MS:  No joint pain or swelling.  No decreased range of motion.  No back pain.  Psych:  No change in mood or affect. No depression or anxiety.  No memory loss.      Objective:   Physical Exam GEN: A/Ox3; pleasant , NAD, elderly ,in wheelchair  HEENT:  Sargeant/AT,  EACs-clear, TMs-wnl, NOSE-clear, THROAT-clear, no lesions, no postnasal drip or exudate noted.   NECK:  Supple w/ fair ROM; no JVD; normal carotid impulses w/o bruits; no thyromegaly  or nodules palpated; no lymphadenopathy.  RESP  Coarse BS  W/o wheezes/ rales/ or rhonchi.no accessory muscle use, no dullness to percussion  CARD:  RRR, no m/r/g  , no peripheral edema, pulses intact, no cyanosis or clubbing.  GI:   Soft & nt; nml bowel sounds; no organomegaly or masses detected.  Musco: Warm bil, no deformities or joint swelling noted.   Neuro: alert, no focal deficits noted.    Skin: Warm, no lesions or rashes     Assessment & Plan:  Pneumonia Not entirely clear whether his recent hospitalization was due to bacterial infxn vs viral pneumonitis vs another inflammatory process like amiodarone toxicity. His CXR is improved today 3/5, ? Due to steroids or to time.  - his amio has been stopped and although it is unclear whether this was the culprit, should probably be held if at all possible - will plan repeat CXR or CT scan in several months to insure stability  COPD Continue symbicort bid and O2 as he is using them

## 2011-06-09 ENCOUNTER — Ambulatory Visit (INDEPENDENT_AMBULATORY_CARE_PROVIDER_SITE_OTHER): Payer: Medicare HMO | Admitting: Pharmacist

## 2011-06-09 DIAGNOSIS — Z7901 Long term (current) use of anticoagulants: Secondary | ICD-10-CM

## 2011-06-09 DIAGNOSIS — I4891 Unspecified atrial fibrillation: Secondary | ICD-10-CM

## 2011-06-09 LAB — POCT INR: INR: 4.5

## 2011-06-14 ENCOUNTER — Ambulatory Visit: Payer: Medicare HMO | Admitting: Cardiovascular Disease

## 2011-06-16 ENCOUNTER — Encounter: Payer: Self-pay | Admitting: Internal Medicine

## 2011-06-30 ENCOUNTER — Ambulatory Visit (INDEPENDENT_AMBULATORY_CARE_PROVIDER_SITE_OTHER): Payer: Medicare HMO | Admitting: *Deleted

## 2011-06-30 ENCOUNTER — Ambulatory Visit (INDEPENDENT_AMBULATORY_CARE_PROVIDER_SITE_OTHER): Payer: Medicare HMO | Admitting: Cardiovascular Disease

## 2011-06-30 ENCOUNTER — Other Ambulatory Visit: Payer: Self-pay | Admitting: Cardiovascular Disease

## 2011-06-30 ENCOUNTER — Encounter: Payer: Self-pay | Admitting: Cardiovascular Disease

## 2011-06-30 ENCOUNTER — Encounter: Payer: Self-pay | Admitting: Internal Medicine

## 2011-06-30 ENCOUNTER — Ambulatory Visit (INDEPENDENT_AMBULATORY_CARE_PROVIDER_SITE_OTHER): Payer: Medicare HMO

## 2011-06-30 VITALS — BP 110/52 | HR 58 | Wt 194.8 lb

## 2011-06-30 DIAGNOSIS — E785 Hyperlipidemia, unspecified: Secondary | ICD-10-CM

## 2011-06-30 DIAGNOSIS — I10 Essential (primary) hypertension: Secondary | ICD-10-CM

## 2011-06-30 DIAGNOSIS — Z7901 Long term (current) use of anticoagulants: Secondary | ICD-10-CM

## 2011-06-30 DIAGNOSIS — I872 Venous insufficiency (chronic) (peripheral): Secondary | ICD-10-CM

## 2011-06-30 DIAGNOSIS — J8410 Pulmonary fibrosis, unspecified: Secondary | ICD-10-CM

## 2011-06-30 DIAGNOSIS — I4891 Unspecified atrial fibrillation: Secondary | ICD-10-CM

## 2011-06-30 DIAGNOSIS — I442 Atrioventricular block, complete: Secondary | ICD-10-CM

## 2011-06-30 DIAGNOSIS — M109 Gout, unspecified: Secondary | ICD-10-CM

## 2011-06-30 DIAGNOSIS — I359 Nonrheumatic aortic valve disorder, unspecified: Secondary | ICD-10-CM

## 2011-06-30 DIAGNOSIS — I2510 Atherosclerotic heart disease of native coronary artery without angina pectoris: Secondary | ICD-10-CM

## 2011-06-30 LAB — PACEMAKER DEVICE OBSERVATION
AL AMPLITUDE: 1 mv
AL IMPEDENCE PM: 337 Ohm
ATRIAL PACING PM: 100
BAMS-0003: 70 {beats}/min
BATTERY VOLTAGE: 2.8 V
DEVICE MODEL PM: 1288935
RV LEAD IMPEDENCE PM: 366 Ohm
RV LEAD THRESHOLD: 0.75 V
VENTRICULAR PACING PM: 82

## 2011-06-30 NOTE — Assessment & Plan Note (Signed)
Continue colchicine Improved since D/C

## 2011-06-30 NOTE — Patient Instructions (Signed)
Your physician recommends that you schedule a follow-up appointment in:  3 MONTHS WITH  DR NISHAN  Your physician recommends that you continue on your current medications as directed. Please refer to the Current Medication list given to you today.  

## 2011-06-30 NOTE — Progress Notes (Signed)
This is an elderly 78 year old white male patient who has a history of coronary artery disease status post CABG and AVR in 2011. He also has history of atrial fibrillation status post pacer in successful cardioversion on September 05, 2010 and has been maintained on amiodarone. He does not tolerate atrial fib very well. He also has a history of diastolic dysfunction. The patient has a history of lung cancer status post left lower lobectomy in 1991. He has significant COPD and is on home oxygen. He recently had a hospitalization for progressive bilateral pulmonary infiltrates. They didn't know if this was post viral pneumonitis versus amiodarone lung toxicity versus CAP with resistant organism. He was treated with antibiotics and high-dose steroids and improved. His amiodarone was stopped but restarted after D/C.   Family upset by care of hospitalists.  Also upset that our pulmonary doctors were not initially involved with care or did not admit patient  Discussed issues of antiarrhythmic and importance of staying in NSR since he is so fragile.  Now on 4L of oxygen since D/C.  Will stop all amiodarone and start Multaq since it is as effective without pulmonary side effects  ROS: Denies fever, malais, weight loss, blurry vision, decreased visual acuity, cough, sputum, SOB, hemoptysis, pleuritic pain, palpitaitons, heartburn, abdominal pain, melena, lower extremity edema, claudication, or rash.  All other systems reviewed and negative   Post AVR Echo reviewed and report below Study Conclusions  - Left ventricle: There is septal dyssynergy. The cavity size was normal. Wall thickness was increased in a pattern of mild LVH. The estimated ejection fraction was 50%. - Aortic valve: The tissue aortic valve prosthesis seems to be working well. - Mitral valve: Mildly calcified annulus. - Left atrium: The atrium was moderately dilated. - Right ventricle: The cavity size was mildly dilated. Pacer wire or catheter  noted in right ventricle. Systolic function was mildly reduced. - Pulmonic valve: There is a floppy pulmonic valve that is unchanged from the prior srudy. - Pulmonary arteries: PA peak pressure: 39mm Hg (S). - Impressions: The changes from 07/09/2008 include an aortic prosthesis, a pacemaker, some decrease in RV function, some decrease in LV function with septal dyssynergy (pacer or post surgical). The QRS is wider and the rhythm may be atrial fib. Impressions:  - The changes from 07/09/2008 include an aortic prosthesis, a pacemaker, some decrease in RV function, some decrease in LV function with septal dyssynergy (pacer or post surgical). The QRS is wider and the rhythm may be atrial fib.   General: Affect appropriate Frail elderly male HEENT: normal Neck supple with no adenopathy JVP normal no bruits no thyromegaly Lungs clear with no wheezing and good diaphragmatic motion Heart:  S1/S2 SEM  murmur, no rub, gallop or click PMI normal Abdomen: benighn, BS positve, no tenderness, no AAA no bruit.  No HSM or HJR Distal pulses intact with no bruits Trace bilateral  Edema severe bilateral varicosities Neuro non-focal Skin warm and dry No muscular weakness   Current Outpatient Prescriptions  Medication Sig Dispense Refill  . acetaminophen (TYLENOL) 650 MG CR tablet Take 1,950 mg by mouth daily as needed. For arthritis.      Marland Kitchen amiodarone (PACERONE) 200 MG tablet Take 1 tablet (200 mg total) by mouth 2 (two) times daily.  60 tablet  1  . aspirin 81 MG tablet Take 81 mg by mouth daily.        . budesonide-formoterol (SYMBICORT) 160-4.5 MCG/ACT inhaler Inhale 2 puffs into the lungs 2 (two)  times daily.  1 Inhaler  5  . colchicine 0.6 MG tablet Take 1 tablet (0.6 mg total) by mouth 2 (two) times daily as needed.  60 tablet  1  . diphenhydrAMINE (BENADRYL) 25 MG tablet Take 25 mg by mouth every 6 (six) hours as needed. For sleep.      Marland Kitchen docusate sodium (COLACE) 100 MG capsule Take 100 mg  by mouth 2 (two) times daily as needed. For constipation.      . furosemide (LASIX) 40 MG tablet Take 1 tablet (40 mg total) by mouth daily.  30 tablet  10  . lansoprazole (PREVACID) 30 MG capsule Take 1 capsule (30 mg total) by mouth 2 (two) times daily.  60 capsule  11  . levalbuterol (XOPENEX HFA) 45 MCG/ACT inhaler Inhale 1-2 puffs into the lungs every 6 (six) hours as needed. For shortness of breath.  1 Inhaler  5  . meloxicam (MOBIC) 7.5 MG tablet Take 7.5 mg by mouth daily as needed. For pain.      . metoprolol (LOPRESSOR) 50 MG tablet Take 1 tablet (50 mg total) by mouth 2 (two) times daily.  60 tablet  11  . nystatin cream (MYCOSTATIN) Apply topically 2 (two) times daily.  30 g  0  . predniSONE (DELTASONE) 10 MG tablet Take 6 tabs daily for 5 days, then 5 tabs, daily for 5 days, then 4 tabs daily for 5 days, then 3 tabs daily for 5 days, then 2 tabs daily for 5 days, then 1 tab daily for 5 days  105 tablet  0  . senna (SENOKOT) 8.6 MG tablet Take 1 tablet by mouth daily as needed. For constipation.      . sertraline (ZOLOFT) 25 MG tablet Take 12.5 mg by mouth daily.      Marland Kitchen warfarin (COUMADIN) 2.5 MG tablet Take as directed by the Anticoagulation Clinic  45 tablet  3  . zolpidem (AMBIEN) 5 MG tablet Take 1 tablet (5 mg total) by mouth at bedtime.  30 tablet  0    Allergies  Penicillins  Electrocardiogram:  NSR rate 72  LAD poor R wave progression  1/13  Assessment and Plan

## 2011-06-30 NOTE — Assessment & Plan Note (Signed)
Well controlled.  Continue current medications and low sodium Dash type diet.    

## 2011-06-30 NOTE — Assessment & Plan Note (Signed)
Stable continue current meds and support hose.  Increased risk for phlebitis

## 2011-06-30 NOTE — Progress Notes (Signed)
PPM check 

## 2011-06-30 NOTE — Assessment & Plan Note (Signed)
Cholesterol is at goal.  Continue current dose of statin and diet Rx.  No myalgias or side effects.  F/U  LFT's in 6 months. Lab Results  Component Value Date   LDLCALC 48 04/09/2010             

## 2011-06-30 NOTE — Assessment & Plan Note (Signed)
Major issue.  Previous thoracotomy with lung CA.  Declining pulmonary function since AVR and since recent hospitalization.  F/U Dr Delton Coombes.  Continue oxgyen at 4L  Should discuss palliative care with pulmonary and primary.

## 2011-06-30 NOTE — Assessment & Plan Note (Signed)
Initially referred to [facility] for ? TAVR  Had open AVR with tissue valve.  Normal function by echo

## 2011-06-30 NOTE — Assessment & Plan Note (Signed)
Maint NSR.  Interogated pacer today and only 6 seconds of afib since August.  Not ideal situation but lung disease is progressive and need to stop all amiodarone.  He does poorly in afib.  Start Multaq just 400 mg daily for now

## 2011-06-30 NOTE — Assessment & Plan Note (Signed)
Stable with no angina and good activity level.  Continue medical Rx  

## 2011-07-04 ENCOUNTER — Telehealth: Payer: Self-pay | Admitting: *Deleted

## 2011-07-04 MED ORDER — DRONEDARONE HCL 400 MG PO TABS: 400.0000 mg | ORAL_TABLET | Freq: Every day | ORAL | Status: DC

## 2011-07-04 NOTE — Telephone Encounter (Signed)
Pt aware to stop Amiodarone and start Multaq 400 mg daily ./cy

## 2011-07-15 ENCOUNTER — Ambulatory Visit (INDEPENDENT_AMBULATORY_CARE_PROVIDER_SITE_OTHER): Payer: Medicare HMO | Admitting: Pharmacist

## 2011-07-15 DIAGNOSIS — Z7901 Long term (current) use of anticoagulants: Secondary | ICD-10-CM

## 2011-07-15 DIAGNOSIS — I4891 Unspecified atrial fibrillation: Secondary | ICD-10-CM

## 2011-07-15 LAB — POCT INR: INR: 1.7

## 2011-07-27 ENCOUNTER — Telehealth: Payer: Self-pay | Admitting: *Deleted

## 2011-07-27 NOTE — Telephone Encounter (Signed)
[facility] NOTIFIED TO HAVE PT GET HIS MELOXICAM  FILLED  BY  HIS PMD .Adam [name]

## 2011-07-29 ENCOUNTER — Ambulatory Visit (INDEPENDENT_AMBULATORY_CARE_PROVIDER_SITE_OTHER): Payer: Medicare HMO | Admitting: Pharmacist

## 2011-07-29 DIAGNOSIS — I4891 Unspecified atrial fibrillation: Secondary | ICD-10-CM

## 2011-07-29 DIAGNOSIS — Z7901 Long term (current) use of anticoagulants: Secondary | ICD-10-CM

## 2011-07-29 LAB — POCT INR: INR: 1.6

## 2011-08-08 ENCOUNTER — Telehealth: Payer: Self-pay | Admitting: *Deleted

## 2011-08-08 MED ORDER — MELOXICAM 7.5 MG PO TABS: 7.5000 mg | ORAL_TABLET | Freq: Every day | ORAL | Status: DC | PRN

## 2011-08-08 NOTE — Telephone Encounter (Signed)
PER DR Eden Emms  PT MAY TAKE MOBIC AS NEEDED ./CY   MESSAGE FROM SALLY PUTT  ASKED IF OKAY FOR PT TO TAKE ABOVE MENTIONED MED ./

## 2011-08-12 ENCOUNTER — Ambulatory Visit (INDEPENDENT_AMBULATORY_CARE_PROVIDER_SITE_OTHER): Payer: Medicare HMO | Admitting: Pharmacist

## 2011-08-12 DIAGNOSIS — Z7901 Long term (current) use of anticoagulants: Secondary | ICD-10-CM

## 2011-08-12 DIAGNOSIS — I4891 Unspecified atrial fibrillation: Secondary | ICD-10-CM

## 2011-08-12 LAB — POCT INR: INR: 2.4

## 2011-08-22 ENCOUNTER — Other Ambulatory Visit: Payer: Self-pay | Admitting: *Deleted

## 2011-08-22 MED ORDER — METOPROLOL TARTRATE 50 MG PO TABS: 50.0000 mg | ORAL_TABLET | Freq: Two times a day (BID) | ORAL | Status: AC

## 2011-09-02 ENCOUNTER — Ambulatory Visit (INDEPENDENT_AMBULATORY_CARE_PROVIDER_SITE_OTHER): Payer: Medicare HMO | Admitting: Family Medicine

## 2011-09-02 ENCOUNTER — Telehealth: Payer: Self-pay | Admitting: *Deleted

## 2011-09-02 ENCOUNTER — Encounter: Payer: Self-pay | Admitting: Family Medicine

## 2011-09-02 ENCOUNTER — Telehealth: Payer: Self-pay

## 2011-09-02 VITALS — BP 108/60 | HR 71 | Temp 97.7°F | Wt 194.0 lb

## 2011-09-02 DIAGNOSIS — I4891 Unspecified atrial fibrillation: Secondary | ICD-10-CM

## 2011-09-02 DIAGNOSIS — T148XXA Other injury of unspecified body region, initial encounter: Secondary | ICD-10-CM

## 2011-09-02 NOTE — Telephone Encounter (Signed)
Pt fell 2 ft out of bed last night,top of head bleed for short period,no laceration to suture; No h/a,dizziness,N&V, no other pain and no mental confusion. 2 hours ago "head began to feel heavy". Pt to come now to see Dr Para March.

## 2011-09-02 NOTE — Patient Instructions (Signed)
Don't change your coumadin for now and keep the appointment Monday with cardiology.  Take care.

## 2011-09-02 NOTE — Progress Notes (Signed)
He rolled out of bed last night.  Woke up as the hit the floor.  This wasn't an episode of falling or syncope.  He hit the top of his head.  Episode was witnessed.  Not sore anywhere else.  No other bleeding except for the scalp last night.  Bruising on olecranon x2.  On coumadin.  He feels like his head is "full" but isn't having pain other than local soreness.  No mental status changes.  No focal neuro changes.   Speech, swallowing, motor function, thoughts at baseline  Meds, vitals, and allergies reviewed.   ROS: See HPI.  Otherwise, noncontributory.  Nad, on O2 at baseline Small superficial bruise on L posterior/superior portion of scalp, no bleeding, not ttp PERRL Mmm TM wnl Nasal and OP exam wnl Rrr, no ectopy noted No inc in wob, globally decreased BS at baseline Small bruise on olecranon x2, neither ttp Speech and gait at baseline.  CN 2-12 wnl B, S/S/DTR wnl

## 2011-09-02 NOTE — Telephone Encounter (Signed)
Call from [place] at [facility] office  Dr Para March to report that pt had fallen out of bed last night hitting back of head causing area of bruising  The office did INR results 1.9 Instructed to continue with same dose of coumadin as was ordered on last visit and if has any bleeding to go to ER and to be sure to keep appt with Coumadin Clinic as scheduled on Monday These instructions reviewed by Dr Para March and he verbalized agreement with directions

## 2011-09-02 NOTE — Assessment & Plan Note (Signed)
Reassuring exam, no need to image, continue with current coumadin dose and f/u with cards for routine INR check on Monday.

## 2011-09-05 ENCOUNTER — Other Ambulatory Visit: Payer: Self-pay | Admitting: *Deleted

## 2011-09-05 ENCOUNTER — Ambulatory Visit (INDEPENDENT_AMBULATORY_CARE_PROVIDER_SITE_OTHER): Payer: Medicare HMO | Admitting: *Deleted

## 2011-09-05 DIAGNOSIS — Z7901 Long term (current) use of anticoagulants: Secondary | ICD-10-CM

## 2011-09-05 DIAGNOSIS — I4891 Unspecified atrial fibrillation: Secondary | ICD-10-CM

## 2011-09-05 LAB — POCT INR: INR: 2.2

## 2011-09-05 NOTE — Telephone Encounter (Signed)
Faxed refill request.  Directions discrepancy.  Our meds list says take one half (12.5 mg) daily.  Refill request from pharmacy states: take 1 tablet (25 mg) by mouth daily.  Tried to phone patient, no answer.

## 2011-09-06 MED ORDER — SERTRALINE HCL 25 MG PO TABS: 25.0000 mg | ORAL_TABLET | Freq: Every day | ORAL | Status: DC

## 2011-09-06 NOTE — Telephone Encounter (Signed)
Please clarify with patient and then send 90 day supply with 3 refills.  Thanks.

## 2011-09-06 NOTE — Telephone Encounter (Signed)
Wife states he takes Sertraline 25 mg daily.  Rx sent to pharmacy.

## 2011-09-14 ENCOUNTER — Telehealth: Payer: Self-pay | Admitting: Family Medicine

## 2011-09-14 NOTE — Telephone Encounter (Signed)
Pt is needing a change on a form that was filled out by you recently. There was a portion that asked if he needed help with medication management and it needs to be changed from a No to a Yes. I put this in your inbox and patient is saying they need this for an appointment tomorrow before 11 am.

## 2011-09-14 NOTE — Telephone Encounter (Signed)
Form placed up front for pick up, pt's wife advised.

## 2011-09-14 NOTE — Telephone Encounter (Signed)
I'll correct the form when I get to clinic.  Then give to patient. Thanks.

## 2011-09-26 ENCOUNTER — Ambulatory Visit (INDEPENDENT_AMBULATORY_CARE_PROVIDER_SITE_OTHER): Payer: Medicare HMO | Admitting: Cardiovascular Disease

## 2011-09-26 ENCOUNTER — Encounter: Payer: Self-pay | Admitting: Cardiovascular Disease

## 2011-09-26 VITALS — BP 108/66 | HR 69 | Wt 195.0 lb

## 2011-09-26 DIAGNOSIS — R0602 Shortness of breath: Secondary | ICD-10-CM

## 2011-09-26 DIAGNOSIS — I359 Nonrheumatic aortic valve disorder, unspecified: Secondary | ICD-10-CM

## 2011-09-26 DIAGNOSIS — I872 Venous insufficiency (chronic) (peripheral): Secondary | ICD-10-CM

## 2011-09-26 DIAGNOSIS — I4891 Unspecified atrial fibrillation: Secondary | ICD-10-CM

## 2011-09-26 DIAGNOSIS — I10 Essential (primary) hypertension: Secondary | ICD-10-CM

## 2011-09-26 DIAGNOSIS — I2510 Atherosclerotic heart disease of native coronary artery without angina pectoris: Secondary | ICD-10-CM

## 2011-09-26 DIAGNOSIS — E785 Hyperlipidemia, unspecified: Secondary | ICD-10-CM

## 2011-09-26 NOTE — Assessment & Plan Note (Signed)
Maint NSR for most part on multaq. Continue

## 2011-09-26 NOTE — Assessment & Plan Note (Signed)
S/P AVR with normal function by echo

## 2011-09-26 NOTE — Progress Notes (Signed)
Patient ID: Adam [name], male   DOB: 1921-02-11, 78 y.o.   MRN: [id number] This is an elderly 78 year old white male patient who has a history of coronary artery disease status post CABG and AVR in 2011. He also has history of atrial fibrillation status post pacer in successful cardioversion on September 05, 2010 and has been maintained on amiodarone. He does not tolerate atrial fib very well. He also has a history of diastolic dysfunction. The patient has a history of lung cancer status post left lower lobectomy in 1991. He has significant COPD and is on home oxygen. He recently had a hospitalization for progressive bilateral pulmonary infiltrates. They didn't know if this was post viral pneumonitis versus amiodarone lung toxicity versus CAP with resistant organism. He was treated with antibiotics and high-dose steroids and improved. His amiodarone was stopped but restarted after D/C.  Family upset by care of hospitalists. Also upset that our pulmonary doctors were not initially involved with care or did not admit patient  Discussed issues of antiarrhythmic and importance of staying in NSR since he is so fragile. Now on 4L of oxygen since D/C.  Amiodarone stopped due to lung issues and he has been tolerating Multaq  Needs hospital bed with rails as he has fallen out of bed multiple times  ROS: Denies fever, malais, weight loss, blurry vision, decreased visual acuity, cough, sputum, SOB, hemoptysis, pleuritic pain, palpitaitons, heartburn, abdominal pain, melena, lower extremity edema, claudication, or rash.  All other systems reviewed and negative  General: Affect appropriate Chroncially ill male on oxygen HEENT: normal Neck supple with no adenopathy JVP normal no bruits no thyromegaly Lungs clear with no wheezing and good diaphragmatic motion Heart:  S1/S2 AVR systolic  murmur, no rub, gallop or click PMI normal Abdomen: benighn, BS positve, no tenderness, no AAA no bruit.  No HSM or HJR Distal pulses  intact with no bruits Plus 2  Edema large varicosities Neuro non-focal Skin warm and dry No muscular weakness   Current Outpatient Prescriptions  Medication Sig Dispense Refill  . acetaminophen (TYLENOL) 650 MG CR tablet Take 650 mg by mouth every 8 (eight) hours as needed. For arthritis.      Marland Kitchen aspirin 81 MG tablet Take 81 mg by mouth daily.        . budesonide-formoterol (SYMBICORT) 160-4.5 MCG/ACT inhaler Inhale 2 puffs into the lungs 2 (two) times daily.  1 Inhaler  5  . colchicine 0.6 MG tablet Take 1 tablet (0.6 mg total) by mouth 2 (two) times daily as needed.  60 tablet  1  . diphenhydrAMINE (BENADRYL) 25 MG tablet Take 25 mg by mouth every 6 (six) hours as needed. For sleep.      Marland Kitchen docusate sodium (COLACE) 100 MG capsule Take 100 mg by mouth 2 (two) times daily as needed. For constipation.      . dronedarone (MULTAQ) 400 MG tablet Take 1 tablet (400 mg total) by mouth daily.  30 tablet  11  . furosemide (LASIX) 40 MG tablet Take 1 tablet (40 mg total) by mouth daily.  30 tablet  10  . lansoprazole (PREVACID) 30 MG capsule Take 1 capsule (30 mg total) by mouth 2 (two) times daily.  60 capsule  11  . levalbuterol (XOPENEX HFA) 45 MCG/ACT inhaler Inhale 1-2 puffs into the lungs every 6 (six) hours as needed. For shortness of breath.  1 Inhaler  5  . meloxicam (MOBIC) 7.5 MG tablet Take 1 tablet (7.5 mg total) by mouth  daily as needed. For pain.  30 tablet  1  . metoprolol (LOPRESSOR) 50 MG tablet Take 1 tablet (50 mg total) by mouth 2 (two) times daily.  60 tablet  11  . nystatin cream (MYCOSTATIN) Apply topically 2 (two) times daily.  30 g  0  . senna (SENOKOT) 8.6 MG tablet Take 1 tablet by mouth daily as needed. For constipation.      . sertraline (ZOLOFT) 25 MG tablet Take 1 tablet (25 mg total) by mouth daily.  90 tablet  3  . warfarin (COUMADIN) 2.5 MG tablet Take as directed by the Anticoagulation Clinic  45 tablet  3  . zolpidem (AMBIEN) 5 MG tablet Take 1 tablet (5 mg total)  by mouth at bedtime.  30 tablet  0    Allergies  Penicillins  Electrocardiogram:  Assessment and Plan

## 2011-09-26 NOTE — Assessment & Plan Note (Signed)
Lung CA with previous thoracotomy ILD post AVR F/U pulmonary and continue home oxygen

## 2011-09-26 NOTE — Assessment & Plan Note (Signed)
Cholesterol is at goal.  Continue current dose of statin and diet Rx.  No myalgias or side effects.  F/U  LFT's in 6 months. Lab Results  Component Value Date   LDLCALC 48 04/09/2010             

## 2011-09-26 NOTE — Assessment & Plan Note (Signed)
Stable with no angina and good activity level.  Continue medical Rx  

## 2011-09-26 NOTE — Patient Instructions (Addendum)
Your physician recommends that you schedule a follow-up appointment in:  3 MONTHS WITH  DR NISHAN  Your physician recommends that you continue on your current medications as directed. Please refer to the Current Medication list given to you today.  

## 2011-09-26 NOTE — Assessment & Plan Note (Signed)
Continue current dose of diuretic.  Compression hose

## 2011-09-26 NOTE — Assessment & Plan Note (Signed)
Well controlled.  Continue current medications and low sodium Dash type diet.    

## 2011-10-05 ENCOUNTER — Ambulatory Visit (INDEPENDENT_AMBULATORY_CARE_PROVIDER_SITE_OTHER): Payer: Medicare HMO | Admitting: *Deleted

## 2011-10-05 DIAGNOSIS — I4891 Unspecified atrial fibrillation: Secondary | ICD-10-CM

## 2011-10-05 DIAGNOSIS — Z7901 Long term (current) use of anticoagulants: Secondary | ICD-10-CM

## 2011-10-05 LAB — POCT INR: INR: 2.1

## 2011-10-07 ENCOUNTER — Other Ambulatory Visit: Payer: Self-pay | Admitting: *Deleted

## 2011-10-07 MED ORDER — WARFARIN SODIUM 2.5 MG PO TABS: ORAL_TABLET | ORAL | Status: DC

## 2011-10-17 ENCOUNTER — Ambulatory Visit (INDEPENDENT_AMBULATORY_CARE_PROVIDER_SITE_OTHER): Payer: Medicare HMO | Admitting: Family Medicine

## 2011-10-17 ENCOUNTER — Encounter: Payer: Self-pay | Admitting: Family Medicine

## 2011-10-17 VITALS — BP 118/70 | HR 69 | Temp 97.5°F | Wt 195.0 lb

## 2011-10-17 DIAGNOSIS — M549 Dorsalgia, unspecified: Secondary | ICD-10-CM

## 2011-10-17 MED ORDER — HYDROCODONE-ACETAMINOPHEN 5-500 MG PO TABS: 0.5000 | ORAL_TABLET | Freq: Three times a day (TID) | ORAL | Status: AC | PRN

## 2011-10-17 NOTE — Patient Instructions (Addendum)
Take the vicodin as needed and take care.

## 2011-10-18 NOTE — Progress Notes (Signed)
Feel out of bed early Sunday AM.  Rolled out of bed asleep. Bed with rails in to be delivered.  Hit a chair on the way down, with his back.  Awoke on contact.  On coumadin. No bruising or bleeding. Sore.  Here for eval.  No focal neuro changes.   .Meds, vitals, and allergies reviewed.   ROS: See HPI.  Otherwise, noncontributory.  nad On O2, at baseline rrr ctab but global dec in BS at baseline abd soft Back w/o midline pain or bruising ttp along the L midaxillary line but no point tenderness Gait at baseline excepting the changes due to muscle soreness on L side of back

## 2011-10-18 NOTE — Assessment & Plan Note (Signed)
Likely benign MSK source and okay for outpatient f/u.  Use vicodin prn given the coumadin, avoid nsaids.  sedatoin caution.  Bed with rails pending. Didn't recheck INR due to lack of bruising.  F/u prn.  Pt and family agree.

## 2011-10-19 ENCOUNTER — Telehealth: Payer: Self-pay | Admitting: Family Medicine

## 2011-10-19 NOTE — Telephone Encounter (Signed)
Please fax order for Gel Mattress topper.  Fall risk, risk of skin breakdown while in bed.  Thanks.

## 2011-10-19 NOTE — Telephone Encounter (Signed)
Pt is needing an rx faxed to [facility] for a Gel Mattress topper. His wife says if there are any questions please give her a call.

## 2011-10-20 ENCOUNTER — Encounter: Payer: Self-pay | Admitting: *Deleted

## 2011-10-20 NOTE — Telephone Encounter (Signed)
Order written, signed by Dr. Para March and faxed to [facility].

## 2011-10-31 ENCOUNTER — Other Ambulatory Visit: Payer: Self-pay | Admitting: *Deleted

## 2011-10-31 MED ORDER — FUROSEMIDE 40 MG PO TABS: 40.0000 mg | ORAL_TABLET | Freq: Every day | ORAL | Status: DC

## 2011-11-16 ENCOUNTER — Ambulatory Visit (INDEPENDENT_AMBULATORY_CARE_PROVIDER_SITE_OTHER): Payer: Medicare HMO | Admitting: *Deleted

## 2011-11-16 DIAGNOSIS — I4891 Unspecified atrial fibrillation: Secondary | ICD-10-CM

## 2011-11-16 DIAGNOSIS — Z7901 Long term (current) use of anticoagulants: Secondary | ICD-10-CM

## 2011-11-22 ENCOUNTER — Other Ambulatory Visit: Payer: Self-pay | Admitting: *Deleted

## 2011-11-22 NOTE — Telephone Encounter (Signed)
Faxed refill request   

## 2011-11-22 NOTE — Telephone Encounter (Signed)
Call pt.  Given his renal function and his other meds, I wouldn't continue meloxicam.  I would try to use OTC tylenol instead.

## 2011-11-23 NOTE — Telephone Encounter (Signed)
Patient and his wife notified as instructed by telephone. 

## 2011-11-25 ENCOUNTER — Other Ambulatory Visit: Payer: Self-pay | Admitting: *Deleted

## 2011-11-25 NOTE — Telephone Encounter (Signed)
Last filled 10/07/2011

## 2011-11-27 NOTE — Telephone Encounter (Signed)
Denied. Have pharmacy stop sending requests. See phone note from 11/22/11.  Notify me if pain isn't controlled.

## 2011-11-28 NOTE — Telephone Encounter (Signed)
Rob at [facility] as instructed by telephone.  Rob will notify patient.

## 2011-11-30 ENCOUNTER — Ambulatory Visit (INDEPENDENT_AMBULATORY_CARE_PROVIDER_SITE_OTHER): Payer: Medicare HMO | Admitting: Pharmacist

## 2011-11-30 DIAGNOSIS — I4891 Unspecified atrial fibrillation: Secondary | ICD-10-CM

## 2011-11-30 DIAGNOSIS — Z7901 Long term (current) use of anticoagulants: Secondary | ICD-10-CM

## 2011-12-21 ENCOUNTER — Other Ambulatory Visit: Payer: Self-pay | Admitting: *Deleted

## 2011-12-21 MED ORDER — BUDESONIDE-FORMOTEROL FUMARATE 160-4.5 MCG/ACT IN AERO: 2.0000 | INHALATION_SPRAY | Freq: Two times a day (BID) | RESPIRATORY_TRACT | Status: DC

## 2011-12-28 ENCOUNTER — Ambulatory Visit: Payer: Medicare HMO | Admitting: Cardiovascular Disease

## 2011-12-30 ENCOUNTER — Ambulatory Visit (INDEPENDENT_AMBULATORY_CARE_PROVIDER_SITE_OTHER): Payer: Medicare HMO | Admitting: *Deleted

## 2011-12-30 DIAGNOSIS — I4891 Unspecified atrial fibrillation: Secondary | ICD-10-CM

## 2011-12-30 DIAGNOSIS — Z7901 Long term (current) use of anticoagulants: Secondary | ICD-10-CM

## 2011-12-30 LAB — POCT INR: INR: 3

## 2012-01-03 ENCOUNTER — Ambulatory Visit (INDEPENDENT_AMBULATORY_CARE_PROVIDER_SITE_OTHER): Payer: Medicare HMO | Admitting: Cardiovascular Disease

## 2012-01-03 VITALS — BP 94/50 | HR 69 | Wt 196.0 lb

## 2012-01-03 DIAGNOSIS — I4891 Unspecified atrial fibrillation: Secondary | ICD-10-CM

## 2012-01-03 DIAGNOSIS — I359 Nonrheumatic aortic valve disorder, unspecified: Secondary | ICD-10-CM

## 2012-01-03 DIAGNOSIS — M129 Arthropathy, unspecified: Secondary | ICD-10-CM

## 2012-01-03 DIAGNOSIS — E785 Hyperlipidemia, unspecified: Secondary | ICD-10-CM

## 2012-01-03 DIAGNOSIS — I10 Essential (primary) hypertension: Secondary | ICD-10-CM

## 2012-01-03 DIAGNOSIS — J9620 Acute and chronic respiratory failure, unspecified whether with hypoxia or hypercapnia: Secondary | ICD-10-CM

## 2012-01-03 DIAGNOSIS — I872 Venous insufficiency (chronic) (peripheral): Secondary | ICD-10-CM

## 2012-01-03 MED ORDER — MELOXICAM 7.5 MG PO TABS: 7.5000 mg | ORAL_TABLET | Freq: Every day | ORAL | Status: AC

## 2012-01-03 NOTE — Assessment & Plan Note (Signed)
Cholesterol is at goal.  Continue current dose of statin and diet Rx.  No myalgias or side effects.  F/U  LFT's in 6 months. Lab Results  Component Value Date   LDLCALC 48 04/09/2010             

## 2012-01-03 NOTE — Patient Instructions (Addendum)
Your physician recommends that you schedule a follow-up appointment in:  3  MONTHS WITH   DR [facility] Your physician recommends that you continue on your current medications as directed. Please refer to the Current Medication list given to you today.  PT NEEDS HOSPICE CARE

## 2012-01-03 NOTE — Assessment & Plan Note (Signed)
Previous partial lobectomy , lung CA and ILD on home oxygen 4L  F/U Dr [facility] consult Advanced directives discussed and DNR

## 2012-01-03 NOTE — Assessment & Plan Note (Signed)
Maint NSR Amiodarone D/C due to lung disease  Continue Multaq

## 2012-01-03 NOTE — Assessment & Plan Note (Signed)
S/P AVR normal exam  SBE prophylaxis

## 2012-01-03 NOTE — Assessment & Plan Note (Signed)
Marked LE varicosities  Continue support hose and current dose of lasix

## 2012-01-03 NOTE — Progress Notes (Signed)
Patient ID: Adam [name], male   DOB: 1920/09/22, 78 y.o.   MRN: [id number] This is an elderly 78 year old white male patient who has a history of coronary artery disease status post CABG and AVR in 2011. He also has history of atrial fibrillation status post pacer in successful cardioversion on September 05, 2010 and has been maintained on amiodarone. He does not tolerate atrial fib very well. He also has a history of diastolic dysfunction. The patient has a history of lung cancer status post left lower lobectomy in 1991. He has significant COPD and is on home oxygen. Hospitalization 2012  for progressive bilateral pulmonary infiltrates. They didn't know if this was post viral pneumonitis versus amiodarone lung toxicity versus CAP with resistant organism. He was treated with antibiotics and high-dose steroids and improved. His amiodarone was stopped but restarted after D/C.  Family upset by care of hospitalists. Also upset that our pulmonary doctors were not initially involved with care or did not admit patient  Discussed issues of antiarrhythmic and importance of staying in NSR since he is so fragile. Now on 4L of oxygen since D/C. Amiodarone stopped due to lung issues and he has been tolerating Multaq Needs hospital bed with rails as he has fallen out of bed multiple times  He has had continued deterioration Really wants to be on meloxicam for pain and understands the risk of bleeding.  Would be appropriate for hospice consult.  Also discussed DNR status and he and wife agree with no shock and no intubation or mechanical ventilation     ROS: Denies fever, malais, weight loss, blurry vision, decreased visual acuity, cough, sputum, SOB, hemoptysis, pleuritic pain, palpitaitons, heartburn, abdominal pain, melena, lower extremity edema, claudication, or rash.  All other systems reviewed and negative  General: Affect appropriate Frail elderly male HEENT: normal Neck supple with no adenopathy JVP normal no  bruits no thyromegaly Lungs rhonchi and poor air movement good diaphragmatic motion Heart:  S1/S2 sEM  murmur, no rub, gallop or click PMI normal Abdomen: benighn, BS positve, no tenderness, no AAA no bruit.  No HSM or HJR Distal pulses intact with no bruits Plus one edema bilateral varicosities Neuro non-focal Skin warm and dry No muscular weakness   Current Outpatient Prescriptions  Medication Sig Dispense Refill  . acetaminophen (TYLENOL) 650 MG CR tablet Take 650 mg by mouth every 8 (eight) hours as needed. For arthritis.      Marland Kitchen aspirin 81 MG tablet Take 81 mg by mouth daily.        . budesonide-formoterol (SYMBICORT) 160-4.5 MCG/ACT inhaler Inhale 2 puffs into the lungs 2 (two) times daily.  1 Inhaler  2  . colchicine 0.6 MG tablet Take 1 tablet (0.6 mg total) by mouth 2 (two) times daily as needed.  60 tablet  1  . diphenhydrAMINE (BENADRYL) 25 MG tablet Take 25 mg by mouth every 6 (six) hours as needed. For sleep.      Marland Kitchen docusate sodium (COLACE) 100 MG capsule Take 100 mg by mouth 2 (two) times daily as needed. For constipation.      . dronedarone (MULTAQ) 400 MG tablet Take 1 tablet (400 mg total) by mouth daily.  30 tablet  11  . furosemide (LASIX) 40 MG tablet Take 1 tablet (40 mg total) by mouth daily.  30 tablet  10  . lansoprazole (PREVACID) 30 MG capsule Take 1 capsule (30 mg total) by mouth 2 (two) times daily.  60 capsule  11  . levalbuterol (  XOPENEX HFA) 45 MCG/ACT inhaler Inhale 1-2 puffs into the lungs every 6 (six) hours as needed. For shortness of breath.  1 Inhaler  5  . metoprolol (LOPRESSOR) 50 MG tablet Take 1 tablet (50 mg total) by mouth 2 (two) times daily.  60 tablet  11  . nystatin cream (MYCOSTATIN) Apply topically 2 (two) times daily.  30 g  0  . senna (SENOKOT) 8.6 MG tablet Take 1 tablet by mouth daily as needed. For constipation.      . sertraline (ZOLOFT) 25 MG tablet Take 1 tablet (25 mg total) by mouth daily.  90 tablet  3  . warfarin (COUMADIN) 2.5  MG tablet Take as directed by the Anticoagulation Clinic  45 tablet  3    Allergies  Penicillins  Electrocardiogram:  Assessment and Plan

## 2012-01-03 NOTE — Assessment & Plan Note (Signed)
Well controlled.  Continue current medications and low sodium Dash type diet.    

## 2012-01-03 NOTE — Assessment & Plan Note (Signed)
His quality of life is much better with meloxicam and he is willing to take risk of gi bleeding.  Will call in script

## 2012-01-04 ENCOUNTER — Telehealth: Payer: Self-pay | Admitting: Cardiovascular Disease

## 2012-01-04 NOTE — Telephone Encounter (Signed)
Daughter calling with questions about why order was placed for father to have hospice care--right in middle of conversation daughter said hospice was calling her and had to hang up--i will wait until she calls back, to explain dr Fabio Bering order

## 2012-01-04 NOTE — Telephone Encounter (Signed)
New problem:  Need to discuss why hospice care was called in . Wants to know what was the marks for Dr. Eden Emms to take this action.  Would like to call back today.

## 2012-01-05 NOTE — Telephone Encounter (Signed)
CALLED PT'S DAUGHTER AND ANSWERED QUESTIONS TO HER SATISFACTION  MAIN ISSUE WHAT HAD BEEN NOTED AT THIS VISIT THAT WARRANTED ORDER FOR HOSPICE CARE  PER DR NISHAN  PT'S HEALTH HAS DECLINED  AND  THE  NEED FOR  ADDITIONAL HELP WITH  CARE  TO HELP SPOUSE .Adam [name]

## 2012-01-23 ENCOUNTER — Telehealth: Payer: Self-pay | Admitting: *Deleted

## 2012-01-23 NOTE — Telephone Encounter (Signed)
Spouse calls to inform us that pt is now under Hospice/Pallative. He is scheduled for CVRR appt on Friday, appt Cx. Telephoned Hospice and spoke with [facility] and gave VO to check INR via coag machine on 01/27/12.Wife aware.

## 2012-01-26 ENCOUNTER — Telehealth: Payer: Self-pay | Admitting: Internal Medicine

## 2012-01-27 ENCOUNTER — Ambulatory Visit: Payer: Self-pay | Admitting: Cardiovascular Disease

## 2012-01-27 DIAGNOSIS — I4891 Unspecified atrial fibrillation: Secondary | ICD-10-CM

## 2012-01-27 DIAGNOSIS — Z7901 Long term (current) use of anticoagulants: Secondary | ICD-10-CM

## 2012-01-27 LAB — POCT INR: INR: 2.2

## 2012-02-03 ENCOUNTER — Ambulatory Visit (INDEPENDENT_AMBULATORY_CARE_PROVIDER_SITE_OTHER)
Admission: RE | Admit: 2012-02-03 | Discharge: 2012-02-03 | Disposition: A | Discharge status: Home / Self Care | Payer: Medicare HMO | Source: Ambulatory Visit | Attending: Emergency Medicine | Admitting: Emergency Medicine

## 2012-02-03 ENCOUNTER — Encounter: Payer: Self-pay | Admitting: Emergency Medicine

## 2012-02-03 ENCOUNTER — Ambulatory Visit (INDEPENDENT_AMBULATORY_CARE_PROVIDER_SITE_OTHER): Payer: Medicare HMO | Admitting: Emergency Medicine

## 2012-02-03 VITALS — BP 120/70 | HR 77 | Temp 97.5°F | Ht 71.0 in | Wt 201.8 lb

## 2012-02-03 DIAGNOSIS — J449 Chronic obstructive pulmonary disease, unspecified: Secondary | ICD-10-CM

## 2012-02-03 DIAGNOSIS — Z23 Encounter for immunization: Secondary | ICD-10-CM

## 2012-02-03 DIAGNOSIS — J4489 Other specified chronic obstructive pulmonary disease: Secondary | ICD-10-CM

## 2012-02-03 NOTE — Progress Notes (Signed)
Subjective:    Patient ID: Adam [name], male    DOB: 05/23/1920, 78 y.o.   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 He also has Afib s/p pacer and successful cardioversion on June 3. He has been on home O2 since his SGY, imaging has shown ? ILD. He also had COPD documented prior to SGY.   ROV 11/26/09 -- returns for f/u of multifactorial SOB, with COPD and restriction post lobectomy as contributors. Since last visit has had CT scan of the chest - no ILD, some pleural thickening, some emphysematous changes. We did trial of Spiriva, only took it for 3 days due to dizziness. He thinks his breathing did benefit some while he was on the med. Has been doing IS more frequently.   ROV 01/27/10 -- COPD and restrictive lung dz (post-LLlobectomy). Last time we agreed to go back on Spiriva, he took for 3 days and stopped because it didnt help. He still feels that his exertional tolerance is limited. He tried xopenex 3 or 4 times, didn't change his breathing significantly.   ROV 04/27/10 -- COPD + restrictive lung dz post LLlobectomy (no ILD on CT scan). Didn't like Spiriva. Last time we started Symbicort two times a day - he feels better on this, but not much. Since he has been on it, has had more throat congestion and scratchiness. He does benefit from his as needed SABA. Today is desaturated on RA to 79%, recovered to 94% on 2L/min. He has felt better since he started using O2 at night, isn't using it during the day or with exertion.   Post Hospital follow up 05/09/11 Pt returns for follow up . Admitted 1/15-1/25 for Progressive bilateral pulmonary infiltrates- Post Viral pneumonitis vs. Amiodarone lung toxicity vs. CAP with Resistant organism- Despite antibiotic coverage CXR continued to look worse. Started on high does steroid trial on 1/23. CXR stabilized.  Responded well to steroids and  discharged on Avelox and steroid taper. Taken off Amiodarone.  Since discharge he is feeling better. Has couple of weeks of  Prednisone left . Has finished Avelox.  CXR showed improved /decreased infiltrate on right.  He is feeling much better. Decreased cough /congestion.   ROV 06/07/11 -- Hx COPD and restriction due to LL lobectomy for lung CA. He had hospitalization as above characterized by RUL and LUL infiltrates that were slow to resolve w abx, was rx with steroids and improved. ? amio toxicity (restarted 05/16/11), ? Viral pneumonitis. Has stayed on symbicort bid. Still limited, feels more SOB and limited since d/c from hospital.   ROV 02/03/12 -- Hx AVR, COPD and restriction due to LL lobectomy for lung CA. Also hx possible COP vs viral pneumonitis with a hospital admission 1/15-1/25. He has noticed more DOE, particularly when off O2 (which he isn't supposed to do). He is no longer on amiodarone. He is using Symbicort most days. He is now receiving hospice care.       Objective:   Physical Exam Filed Vitals:   02/03/12 1500  BP: 120/70  Pulse: 77  Temp: 97.5 F (36.4 C)   GEN: A/Ox3; pleasant , NAD, elderly ,in wheelchair  HEENT:  [place]/AT,  EACs-clear, TMs-wnl, NOSE-clear, THROAT-clear, no lesions, no postnasal drip or exudate noted.   NECK:  Supple w/ fair ROM; no JVD; normal carotid impulses w/o bruits; no thyromegaly or  nodules palpated; no lymphadenopathy.  RESP  Coarse BS  W/o wheezes/ rales/ or rhonchi.no accessory muscle use, no dullness to percussion  CARD:  RRR, no m/r/g  , no peripheral edema, pulses intact, no cyanosis or clubbing.  GI:   Soft & nt; nml bowel sounds; no organomegaly or masses detected.  Musco: Warm bil, no deformities or joint swelling noted.   Neuro: alert, no focal deficits noted.    Skin: Warm, no lesions or rashes     Assessment & Plan:  COPD Slow progression of COPD and chronic resp failure. He is fairly well compensated as long as he uses  O2. Need to check CXR to insure no new infiltrates or after-effects of his pneumonitis from January - symbicort bid - CXR - O2 - rov 4 mon

## 2012-02-03 NOTE — Patient Instructions (Addendum)
Please continue your Symbicort 2 puffs twice a day You need to wear your oxygen on 4L/min ALL THE TIME.  Flu shot today Follow with Dr Delton Coombes in 4 months or sooner if you have any problems.

## 2012-02-03 NOTE — Assessment & Plan Note (Signed)
Slow progression of COPD and chronic resp failure. He is fairly well compensated as long as he uses O2. Need to check CXR to insure no new infiltrates or after-effects of his pneumonitis from January - symbicort bid - CXR - O2 - rov 4 mon

## 2012-02-06 NOTE — Progress Notes (Signed)
Quick Note:  Spoke with patients wife, informed her of results per Dr. Delton Coombes as listed below. Verbalized understanding, Nothing further needed at this time. ______

## 2012-02-14 ENCOUNTER — Encounter: Payer: Self-pay | Admitting: *Deleted

## 2012-02-22 ENCOUNTER — Encounter: Payer: Self-pay | Admitting: Internal Medicine

## 2012-02-22 ENCOUNTER — Ambulatory Visit (INDEPENDENT_AMBULATORY_CARE_PROVIDER_SITE_OTHER): Payer: Medicare HMO | Admitting: Internal Medicine

## 2012-02-22 VITALS — BP 104/63 | HR 83 | Ht 61.32 in | Wt 199.2 lb

## 2012-02-22 DIAGNOSIS — I4891 Unspecified atrial fibrillation: Secondary | ICD-10-CM

## 2012-02-22 DIAGNOSIS — Z950 Presence of cardiac pacemaker: Secondary | ICD-10-CM

## 2012-02-22 DIAGNOSIS — I442 Atrioventricular block, complete: Secondary | ICD-10-CM

## 2012-02-22 NOTE — Assessment & Plan Note (Signed)
Patient has paroxysmal atrial fibrillation. He has been on dronaderone once a day. This is likely not contributing significantly to his rhythm and so we have elected to stop it. In addition, we have stopped his aspirin because of its added risk to warfarin

## 2012-02-22 NOTE — Progress Notes (Signed)
Patient Care Team: Joaquim Nam, MD as PCP - General (Family Medicine)   HPI  Adam [name] is a 78 y.o. male seen in follow up for pacemaker implanted in the context aVR and CABG at [facility] in February 2011. The procedure was complicated by complete heart block requiring permanent pacemaker implantation. Postoperative echo yesterday demonstrated ejection fraction of 50%. There is moderate right ventricular dilatation and diastolic dysfunction With hx of lung cancer and copd  He also had a history of atrial fibrillation for which he is treated with amiodarone. 2012 there are issues of possible amiodarone lung toxicity and the drug was discontinued. He has been maintained on dronaderone although on just one dose dailu  He is markedly limited in his exercise basically going to the bathroom to the table to eat. He is chronically on 4 L of oxygen.  Past Medical History  Diagnosis Date  . Cancer     Lung  . COPD (chronic obstructive pulmonary disease)   . Diverticulosis of colon   . GERD (gastroesophageal reflux disease)     Stricture  . Hyperlipidemia   . Hypertension   . Esophageal dysmotility   . Osteoarthritis   . Aortic stenosis     s/p AVR CABG  . Mild renal insufficiency   . Erectile dysfunction   . Venous insufficiency   . Gout   . BPH (benign prostatic hypertrophy)   . Atrioventricular block, complete     cx cabg/AVR  . Pacemaker     [employer]    Past Surgical History  Procedure Date  . Aortic valve replacement 05/04/09    with CABG x 3  . Pacemaker insertion 05/08/09    [employer], dual chamber  . Cholecystectomy   . Tonsillectomy   . Lobectomy 1991    Lung  . Prostate surgery 1990  . Thumb surgery     Left  . Varicose vein surgery   . Basal cell surgery     Nose  . Esophagogastroduodenoscopy 10/2005    Stricture  . Cardiolite 11/2003    Neg.  Mild to moderate AS  . Carotid US 11/2005    Neg  . Esophagogastroduodenoscopy 07/2004    Gastritis,  stricture, dysmotility  . Rib films 08/2005    Right 10th rib fracture  . Doppler echocardiography 09/2005    Stable  . 2d echocardiogram 06/2007    Mod aortic stenosis with EF 55%  . Colonoscopy     diverticulosis, small polyp, hemorrhoids    Current Outpatient Prescriptions  Medication Sig Dispense Refill  . acetaminophen (TYLENOL) 650 MG CR tablet Take 650 mg by mouth every 8 (eight) hours as needed. For arthritis.      Marland Kitchen aspirin 81 MG tablet Take 81 mg by mouth daily.        . budesonide-formoterol (SYMBICORT) 160-4.5 MCG/ACT inhaler Inhale 2 puffs into the lungs 2 (two) times daily.  1 Inhaler  2  . colchicine 0.6 MG tablet Take 1 tablet (0.6 mg total) by mouth 2 (two) times daily as needed.  60 tablet  1  . diphenhydrAMINE (BENADRYL) 25 MG tablet Take 25 mg by mouth every 6 (six) hours as needed. For sleep.      Marland Kitchen docusate sodium (COLACE) 100 MG capsule Take 100 mg by mouth 2 (two) times daily as needed. For constipation.      . dronedarone (MULTAQ) 400 MG tablet Take 1 tablet (400 mg total) by mouth daily.  30 tablet  11  . furosemide (LASIX) 40 MG tablet Take 1 tablet (40 mg total) by mouth daily.  30 tablet  10  . lansoprazole (PREVACID) 30 MG capsule Take 1 capsule (30 mg total) by mouth 2 (two) times daily.  60 capsule  11  . levalbuterol (XOPENEX HFA) 45 MCG/ACT inhaler Inhale 1-2 puffs into the lungs every 6 (six) hours as needed. For shortness of breath.  1 Inhaler  5  . meloxicam (MOBIC) 7.5 MG tablet Take 1 tablet (7.5 mg total) by mouth daily.  30 tablet  11  . metoprolol (LOPRESSOR) 50 MG tablet Take 1 tablet (50 mg total) by mouth 2 (two) times daily.  60 tablet  11  . nystatin cream (MYCOSTATIN) Apply topically 2 (two) times daily.  30 g  0  . senna (SENOKOT) 8.6 MG tablet Take 1 tablet by mouth daily as needed. For constipation.      . sertraline (ZOLOFT) 25 MG tablet Take 1 tablet (25 mg total) by mouth daily.  90 tablet  3  . warfarin (COUMADIN) 2.5 MG tablet Take as  directed by the Anticoagulation Clinic  45 tablet  3    Allergies  Allergen Reactions  . Penicillins     Review of Systems negative except from HPI and PMH  Physical Exam BP 104/63  Pulse 83  Ht 5' 1.32" (1.558 m)  Wt 199 lb 3.2 oz (90.357 kg)  BMI 37.25 kg/m2 Well developed and well nourished in no acute distress he is on oxygen  HENT normal E scleral and icterus clear Neck Supple JVP flat; carotids brisk and full  diffuse crackles   Regular rate and rhythm, no murmurs gallops or rub Soft with active bowel sounds No clubbing cyanosis Trace Edema Alert and oriented, grossly normal motor and sensory function Skin Warm and Dry    Assessment and  Plan

## 2012-02-22 NOTE — Patient Instructions (Addendum)
Your physician wants you to follow-up in: 1 year with Dr Klein.  You will receive a reminder letter in the mail two months in advance. If you don't receive a letter, please call our office to schedule the follow-up appointment.  

## 2012-02-22 NOTE — Assessment & Plan Note (Signed)
.  dnf The patient's device was interrogated.  The information was reviewed. No changes were made in the programming.  '  

## 2012-02-22 NOTE — Assessment & Plan Note (Signed)
Intermittent mostly with atrial pacing and lower ventricular paced

## 2012-02-24 ENCOUNTER — Ambulatory Visit: Payer: Self-pay | Admitting: Internal Medicine

## 2012-02-24 DIAGNOSIS — I4891 Unspecified atrial fibrillation: Secondary | ICD-10-CM

## 2012-02-24 DIAGNOSIS — Z7901 Long term (current) use of anticoagulants: Secondary | ICD-10-CM

## 2012-02-24 LAB — PACEMAKER DEVICE OBSERVATION
AL AMPLITUDE: 2.1 mv
AL IMPEDENCE PM: 337 Ohm
BAMS-0001: 150 {beats}/min
BATTERY VOLTAGE: 2.81 V
RV LEAD AMPLITUDE: 7.4 mv

## 2012-02-29 ENCOUNTER — Other Ambulatory Visit: Payer: Self-pay | Admitting: Cardiovascular Disease

## 2012-02-29 ENCOUNTER — Other Ambulatory Visit: Payer: Self-pay | Admitting: *Deleted

## 2012-02-29 MED ORDER — WARFARIN SODIUM 2.5 MG PO TABS: ORAL_TABLET | ORAL | Status: DC

## 2012-02-29 NOTE — Telephone Encounter (Signed)
Adam [name] of [place] [phone]   Pt wife is having trouble having coumadin medication,  plz return call to [place].  Pt uses [facility], [place] Phone [phone], Fax [phone]

## 2012-02-29 NOTE — Telephone Encounter (Signed)
LMOM for Adam [name] that Rx was done and called pharmacy and Rx is there for pick up. [name] aware also that Rx is ready.

## 2012-03-07 ENCOUNTER — Telehealth: Payer: Self-pay | Admitting: Cardiovascular Disease

## 2012-03-07 MED ORDER — COLCHICINE 0.6 MG PO TABS: 0.6000 mg | ORAL_TABLET | Freq: Two times a day (BID) | ORAL | Status: AC | PRN

## 2012-03-07 NOTE — Telephone Encounter (Signed)
Colchicine 0.6mg  refill needed, pt having a hard time getting it filled, can get ok? Requesting karen with hospice [phone] pls call to let her know , ok to leave message, uses [facility] in [place]

## 2012-03-16 ENCOUNTER — Encounter: Payer: Self-pay | Admitting: Internal Medicine

## 2012-03-23 ENCOUNTER — Ambulatory Visit: Payer: Self-pay | Admitting: Cardiology

## 2012-03-23 DIAGNOSIS — Z7901 Long term (current) use of anticoagulants: Secondary | ICD-10-CM

## 2012-03-23 DIAGNOSIS — I4891 Unspecified atrial fibrillation: Secondary | ICD-10-CM

## 2012-04-20 ENCOUNTER — Telehealth: Payer: Self-pay | Admitting: Family Medicine

## 2012-04-20 ENCOUNTER — Ambulatory Visit: Payer: Self-pay | Admitting: Cardiovascular Disease

## 2012-04-20 DIAGNOSIS — Z7901 Long term (current) use of anticoagulants: Secondary | ICD-10-CM

## 2012-04-20 DIAGNOSIS — I4891 Unspecified atrial fibrillation: Secondary | ICD-10-CM

## 2012-04-20 LAB — POCT INR: INR: 2.4

## 2012-04-20 MED ORDER — LANSOPRAZOLE 30 MG PO CPDR: 30.0000 mg | DELAYED_RELEASE_CAPSULE | Freq: Two times a day (BID) | ORAL | Status: DC

## 2012-04-20 NOTE — Telephone Encounter (Signed)
Kim with Hospice called on behalf of the patient.  Pt needs a refill on Prevacid 30 mg extended release.  1 capsule 2 times per day.  You can call Selena Batten or the patient with further questions. Kim: [phone] Patient: [phone]

## 2012-04-20 NOTE — Telephone Encounter (Signed)
Spoke with Clydie Braun with hospice and she will let pt know med called to [facility] and pt needs to call for appt.

## 2012-05-29 ENCOUNTER — Other Ambulatory Visit: Payer: Self-pay | Admitting: *Deleted

## 2012-05-29 MED ORDER — LANSOPRAZOLE 30 MG PO CPDR: 30.0000 mg | DELAYED_RELEASE_CAPSULE | Freq: Two times a day (BID) | ORAL | Status: AC

## 2012-05-30 ENCOUNTER — Ambulatory Visit: Payer: Self-pay | Admitting: Internal Medicine

## 2012-05-30 DIAGNOSIS — I4891 Unspecified atrial fibrillation: Secondary | ICD-10-CM

## 2012-05-30 DIAGNOSIS — Z7901 Long term (current) use of anticoagulants: Secondary | ICD-10-CM

## 2012-06-04 ENCOUNTER — Ambulatory Visit: Payer: Medicare HMO | Admitting: Emergency Medicine

## 2012-06-20 ENCOUNTER — Telehealth: Payer: Self-pay | Admitting: Emergency Medicine

## 2012-06-20 MED ORDER — BUDESONIDE-FORMOTEROL FUMARATE 160-4.5 MCG/ACT IN AERO: 2.0000 | INHALATION_SPRAY | Freq: Two times a day (BID) | RESPIRATORY_TRACT | Status: AC

## 2012-06-20 NOTE — Telephone Encounter (Signed)
Rx has been sent in. Hospice Nurse is aware.

## 2012-06-27 ENCOUNTER — Telehealth: Payer: Self-pay | Admitting: Cardiovascular Disease

## 2012-06-27 ENCOUNTER — Ambulatory Visit (INDEPENDENT_AMBULATORY_CARE_PROVIDER_SITE_OTHER): Payer: Medicare HMO | Admitting: Cardiology

## 2012-06-27 DIAGNOSIS — I4891 Unspecified atrial fibrillation: Secondary | ICD-10-CM

## 2012-06-27 DIAGNOSIS — Z7901 Long term (current) use of anticoagulants: Secondary | ICD-10-CM

## 2012-06-27 NOTE — Telephone Encounter (Signed)
New problem   She is looking for a supplemental order that has a date of 05/30/12. They need it back and signed by doctor. If you don't have it she will fax another order back to you. Please call of Amy of Hospice concerning this matter

## 2012-06-27 NOTE — Telephone Encounter (Signed)
Let nurse know that dr/nurse will be back tomorrow and I will let them know about the signature needed for the order. I was unsure if it was received so I asked her to refax/ number provided. Please fax in order/ will forward to nurse.

## 2012-06-28 NOTE — Telephone Encounter (Signed)
ORDERS SIGNED  AND FAXED BACK  TO HOSPICE .Zack Seal

## 2012-07-18 ENCOUNTER — Other Ambulatory Visit: Payer: Self-pay | Admitting: *Deleted

## 2012-07-18 ENCOUNTER — Telehealth: Payer: Self-pay | Admitting: Internal Medicine

## 2012-07-18 MED ORDER — WARFARIN SODIUM 2.5 MG PO TABS: ORAL_TABLET | ORAL | Status: AC

## 2012-07-18 NOTE — Telephone Encounter (Signed)
New problem    Coumadin  2.5 mg refill through [facility] pharmacy.

## 2012-07-18 NOTE — Telephone Encounter (Signed)
Rx sent to pharmacy   

## 2012-08-01 ENCOUNTER — Ambulatory Visit (INDEPENDENT_AMBULATORY_CARE_PROVIDER_SITE_OTHER): Payer: Medicare HMO | Admitting: Internal Medicine

## 2012-08-01 DIAGNOSIS — I4891 Unspecified atrial fibrillation: Secondary | ICD-10-CM

## 2012-08-01 DIAGNOSIS — Z7901 Long term (current) use of anticoagulants: Secondary | ICD-10-CM

## 2012-08-01 LAB — POCT INR: INR: 2.6

## 2012-08-20 ENCOUNTER — Other Ambulatory Visit: Payer: Self-pay | Admitting: Emergency Medicine

## 2012-08-20 MED ORDER — LEVALBUTEROL TARTRATE 45 MCG/ACT IN AERO: 1.0000 | INHALATION_SPRAY | Freq: Four times a day (QID) | RESPIRATORY_TRACT | Status: AC | PRN

## 2012-08-20 NOTE — Telephone Encounter (Signed)
Received faxed refill request from [facility] for Xopenex HFA 1-2puffs q6h prn for shortness of breath Last ov w/ RB 11.1.13 >> follow up in 4 months 06/2012 ov w/ RB was cancelled and has yet to be scheduled Refills sent to pharmacy with note that pt is overdue for appt

## 2012-08-29 ENCOUNTER — Ambulatory Visit (INDEPENDENT_AMBULATORY_CARE_PROVIDER_SITE_OTHER): Payer: Medicare HMO | Admitting: Pharmacist

## 2012-08-29 DIAGNOSIS — Z7901 Long term (current) use of anticoagulants: Secondary | ICD-10-CM

## 2012-08-29 DIAGNOSIS — I4891 Unspecified atrial fibrillation: Secondary | ICD-10-CM

## 2012-09-06 ENCOUNTER — Other Ambulatory Visit: Payer: Self-pay | Admitting: *Deleted

## 2012-09-06 NOTE — Telephone Encounter (Signed)
Hospice nurse, Selena Batten, called for refill on patient's Zoloft.  Rx sent to pharmacy.  Hospice nurse advised.

## 2012-09-06 NOTE — Telephone Encounter (Signed)
Noted. Thanks.

## 2012-09-10 ENCOUNTER — Other Ambulatory Visit: Payer: Self-pay | Admitting: *Deleted

## 2012-09-10 MED ORDER — SERTRALINE HCL 25 MG PO TABS: 25.0000 mg | ORAL_TABLET | Freq: Every day | ORAL | Status: AC

## 2012-09-27 ENCOUNTER — Ambulatory Visit (INDEPENDENT_AMBULATORY_CARE_PROVIDER_SITE_OTHER): Payer: Medicare HMO | Admitting: Internal Medicine

## 2012-09-27 DIAGNOSIS — Z7901 Long term (current) use of anticoagulants: Secondary | ICD-10-CM

## 2012-09-27 DIAGNOSIS — I4891 Unspecified atrial fibrillation: Secondary | ICD-10-CM

## 2012-09-27 LAB — POCT INR: INR: 2.3

## 2012-10-03 ENCOUNTER — Telehealth: Payer: Self-pay | Admitting: Cardiovascular Disease

## 2012-10-03 ENCOUNTER — Other Ambulatory Visit: Payer: Self-pay | Admitting: *Deleted

## 2012-10-03 NOTE — Telephone Encounter (Signed)
Per Hospice Nurse:  Patient has developed a new productive cough which is making him gag.  His resting O2 sat is 85% on 4L O2.  No edema, has lost 5lbs in the last week.  Nurse would like an order for cough syrup or other suggestion from Dr. Eden Emms.  Dr. Eden Emms signed Hospice orders, that is why she is calling us.

## 2012-10-03 NOTE — Telephone Encounter (Signed)
Advised HH Nurse of Dr. Fabio Bering recommendation.

## 2012-10-03 NOTE — Telephone Encounter (Signed)
Can have robitussin with codiene should f/u with primary

## 2012-10-03 NOTE — Telephone Encounter (Signed)
New problem   Adam [name]/hospice pt has developed a productive cough and is gagging really bad which is white mucus. His lungs are clear lung is 85% on 4 liters.

## 2012-10-04 ENCOUNTER — Other Ambulatory Visit: Payer: Self-pay | Admitting: *Deleted

## 2012-10-04 MED ORDER — FUROSEMIDE 40 MG PO TABS: 40.0000 mg | ORAL_TABLET | Freq: Every day | ORAL | Status: AC

## 2012-10-08 ENCOUNTER — Telehealth: Payer: Self-pay | Admitting: Internal Medicine

## 2012-10-08 NOTE — Telephone Encounter (Signed)
New Prob     Pts daughter would like to speak to nurse regarding pt and if getting pacemaker is necessary. Please call.

## 2012-10-08 NOTE — Telephone Encounter (Signed)
Spoke with Adam [name], the pt is scheduled for a device check today and he has really deteriorated in the last week. He is unable to get around before he gets sick. She wonders if this is something that hospice can help with. Will discuss with the device nurses and dr Graciela Husbands.

## 2012-10-08 NOTE — Telephone Encounter (Signed)
Discuss with paula in the device clinic, hospice is not able to help in this regard. She will contact the family and go to the pts home this week to check his device. dtr June made aware.

## 2012-10-09 ENCOUNTER — Ambulatory Visit (INDEPENDENT_AMBULATORY_CARE_PROVIDER_SITE_OTHER): Payer: Medicare HMO | Admitting: *Deleted

## 2012-10-09 DIAGNOSIS — I442 Atrioventricular block, complete: Secondary | ICD-10-CM

## 2012-10-10 ENCOUNTER — Telehealth: Payer: Self-pay | Admitting: Cardiovascular Disease

## 2012-10-10 LAB — PACEMAKER DEVICE OBSERVATION
AL AMPLITUDE: 2.3 mv
AL THRESHOLD: 1 V
BAMS-0001: 150 {beats}/min
DEVICE MODEL PM: 1288935
RV LEAD AMPLITUDE: 4.2 mv
RV LEAD THRESHOLD: 1 V

## 2012-10-10 NOTE — Progress Notes (Signed)
PPM check in home, patient is in hospice.

## 2012-10-10 NOTE — Telephone Encounter (Signed)
VERBAL ORDER GIVEN  FOR  DNR   ALSO  NURSE TARYN AS WELL STATED WOULD BE GIVING   PT  MORPHINE   0.125 - 0.25   MG    AS  NEEDED  TO HELP WITH SOB .Adam [name]

## 2012-10-10 NOTE — Telephone Encounter (Signed)
New problem    Need a verbal order - DNR. To be sign by Hospice MD.  Patient & wife Requesting this.

## 2012-10-17 ENCOUNTER — Telehealth: Payer: Self-pay | Admitting: Cardiovascular Disease

## 2012-10-17 NOTE — Telephone Encounter (Signed)
D/C Received From [facility], called Spoke with [employer] [facility] Let her Know Dr.Nishan not back In office until Friday she Ok'd 10/17/12/KM

## 2012-10-23 ENCOUNTER — Telehealth: Payer: Self-pay | Admitting: *Deleted

## 2012-10-23 NOTE — Telephone Encounter (Signed)
I called his wife and offered my condolences.  She thanked me for the call.

## 2012-10-23 NOTE — Telephone Encounter (Signed)
Patient's wife called this morning to let us know that Mr. Adam [name] passed away on 11-08-2012 at the [facility].  I expressed to her our sincere condolences and told her if there was anything we could do for her, just let us know.  I also informed Lyla Son so that she can document it in his chart.

## 2012-11-02 DEATH — deceased

## 2012-11-09 ENCOUNTER — Encounter: Payer: Self-pay | Admitting: Internal Medicine

## 2013-05-15 IMAGING — CR DG CHEST 2V
2 series · 2 of 2 positions shown · non-contrast
Comparison: 07/03/2009

CLINICAL DATA: Productive cough, fever, congestion

CHEST - 2 VIEW

[view not recorded (1 of 2)]
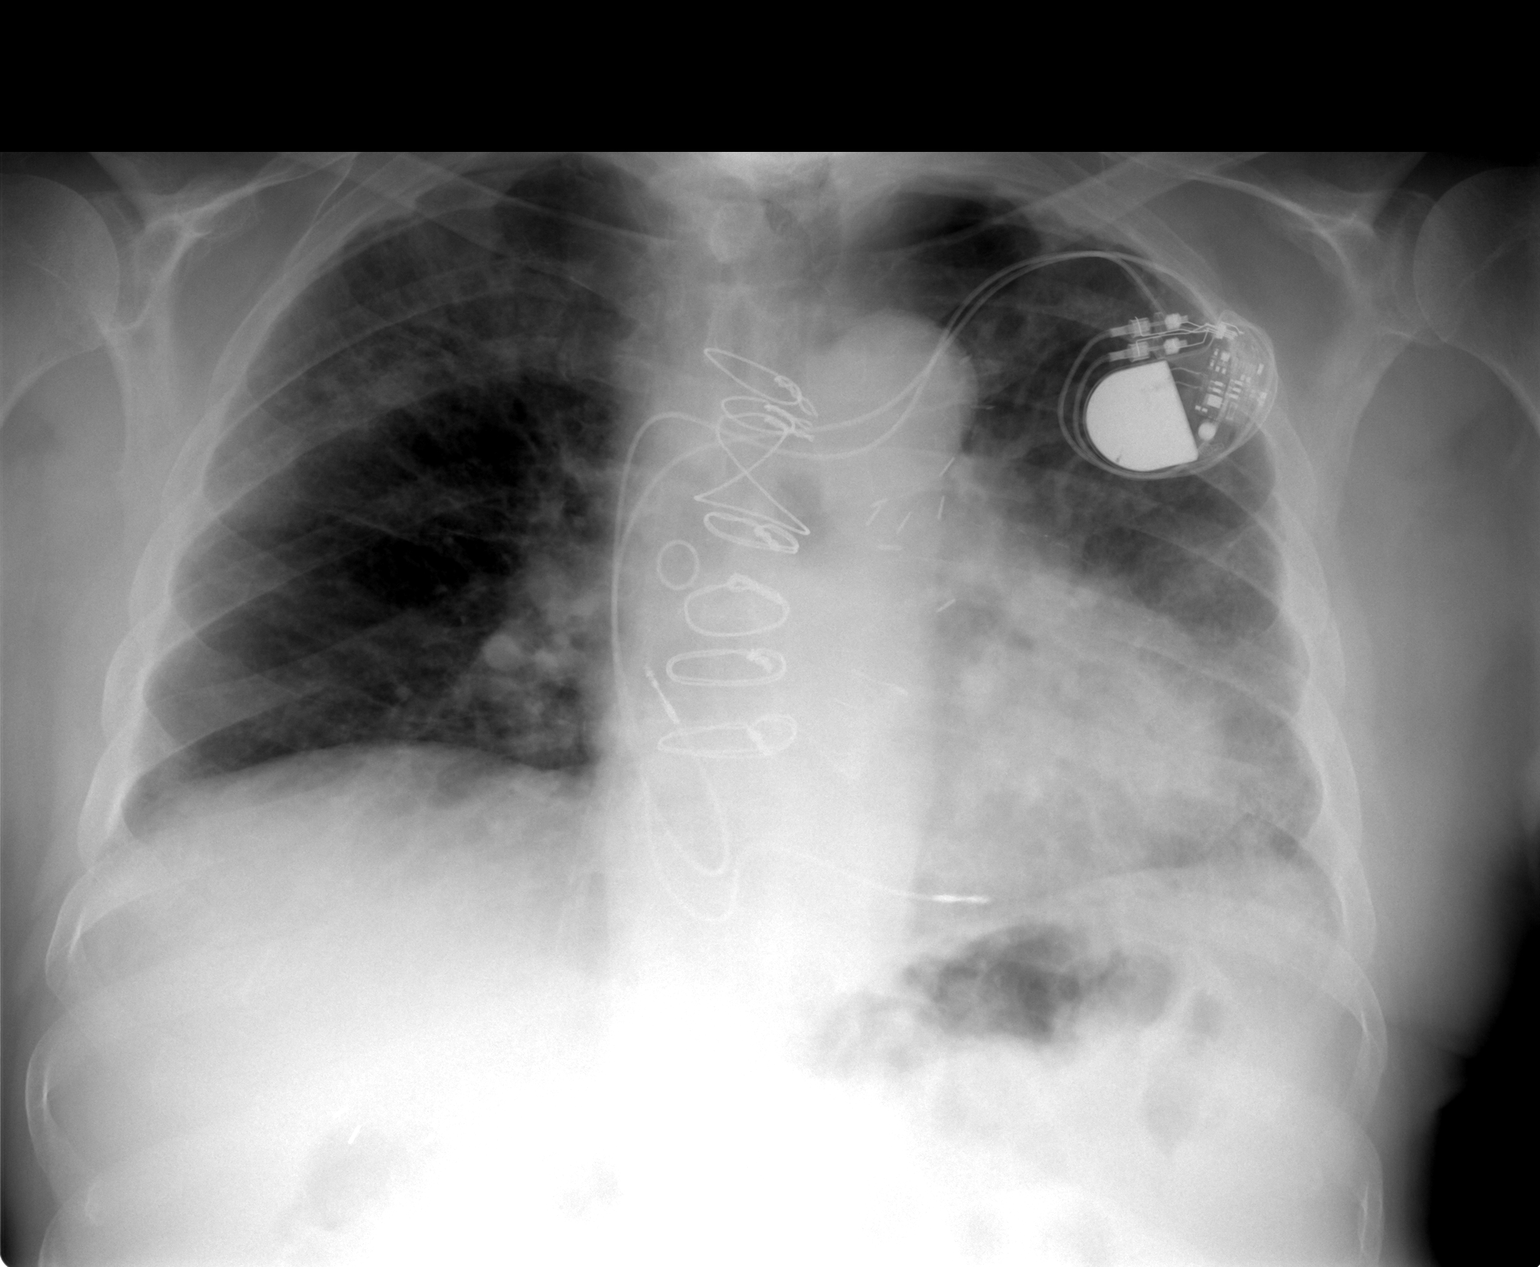

[view not recorded (2 of 2)]
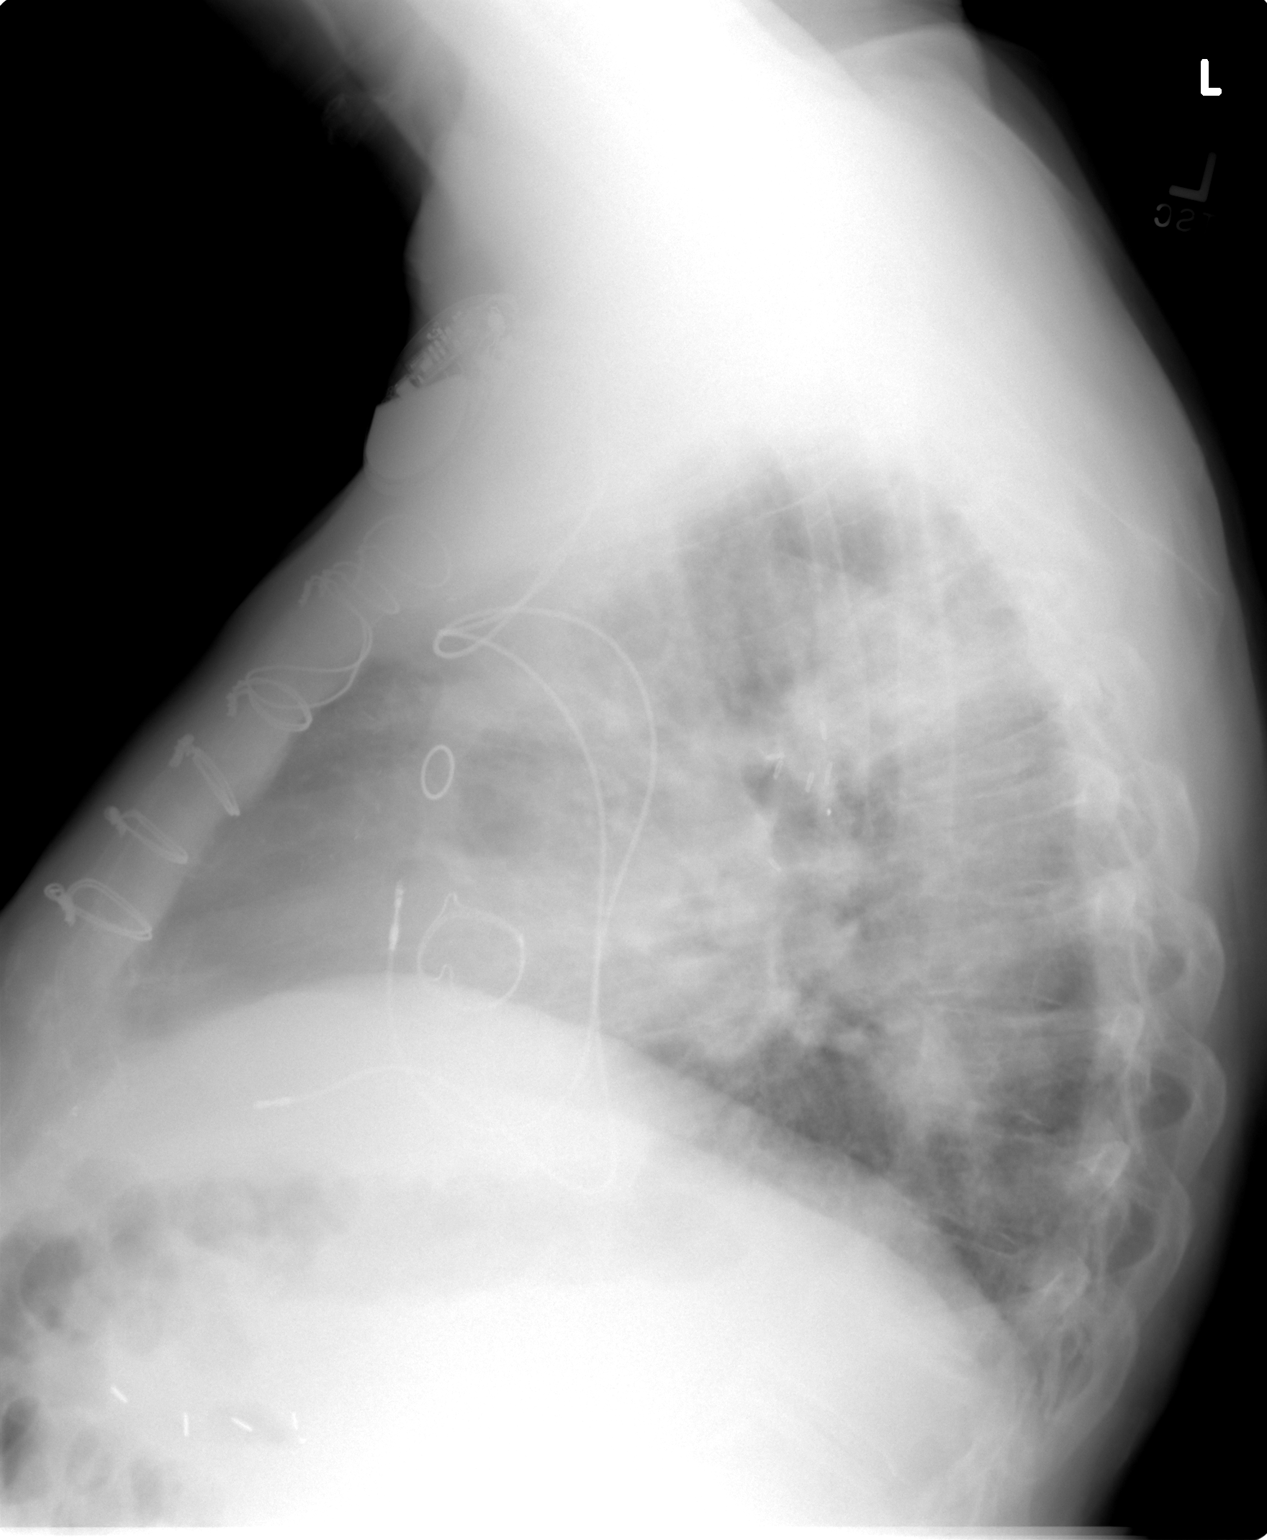

[2 of 2 positions shown; findings below may reference images not displayed]

FINDINGS: Cardiomediastinal silhouette is stable.  Status post CABG
again noted.  Dual lead cardiac pacemaker is unchanged in position.
Stable chronic interstitial prominence and emphysematous changes.
There is hazy airspace disease in the right upper lobe best
visualized on lateral view highly suspicious for superimposed
infiltrate/pneumonia.  Follow-up to resolution after appropriate
treatment is recommended.
IMPRESSION: Stable chronic interstitial prominence and emphysematous changes.
There is hazy airspace disease in the right upper lobe best
visualized on lateral view highly suspicious for superimposed
infiltrate/pneumonia.  Follow-up to resolution after appropriate
treatment is recommended.

## 2013-05-24 IMAGING — CR DG CHEST 1V PORT
1 series · 1 of 1 positions shown · non-contrast
Comparison: 04/27/2011

CLINICAL DATA: Respiratory distress.  "Check endotracheal tube"

PORTABLE CHEST - 1 VIEW

[AP]
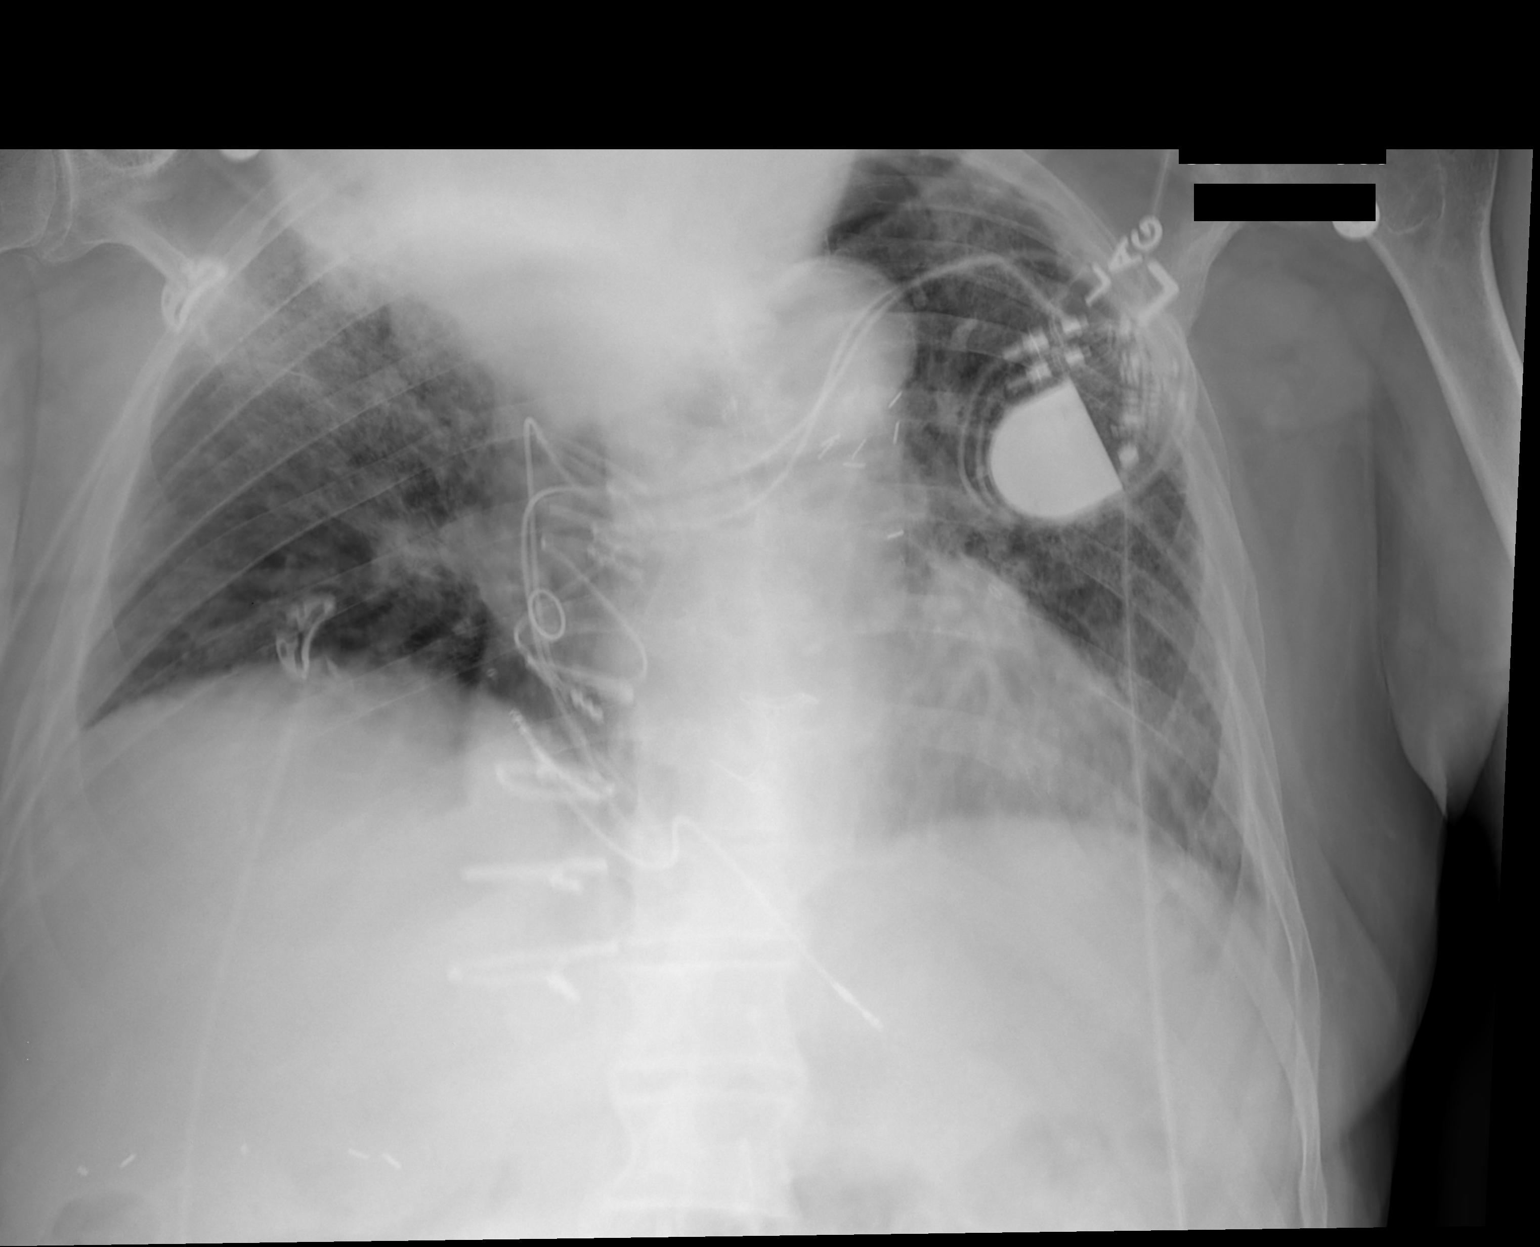

[1 of 1 positions shown; findings below may reference images not displayed]

FINDINGS: The patient's chin and face project over the upper chest
and obscure portions of the upper chest.  No endotracheal tube is
visible although this could be due to the obscuration of the
patient's facial tissues in conjunction with the prominent motion
artifact on today's exam.  Repeat radiography may be warranted.

Dual lead pacer noted.  Postoperative findings from prior CABG and
prior aortic valve replacement noted.

Continued right upper lobe airspace opacity is partially obscured
by the patient's facial tissues.  There is improved aeration in the
lingula.

Low lung volumes are present, causing crowding of the pulmonary
vasculature.  Underlying interstitial accentuation is present
bilaterally
IMPRESSION: 1.  Reduced sensitivity and specificity of today's exam is due to
considerable motion artifact and the patient's chin and facial
tissues projecting over the upper chest.
2.  No endotracheal tube is visualized.
3.  Improved aeration in the lingula.  Continued airspace opacity
in the right upper lobe.
4.  Continued underlying interstitial accentuation.

## 2013-10-17 NOTE — Telephone Encounter (Signed)
Closed Encounter  °

## 2014-03-01 IMAGING — CR DG CHEST 2V
2 series · 2 of 2 positions shown · non-contrast
Comparison: Chest x-ray 06/07/2011.

CLINICAL DATA: Shortness of breath.

CHEST - 2 VIEW

[view not recorded (1 of 2)]
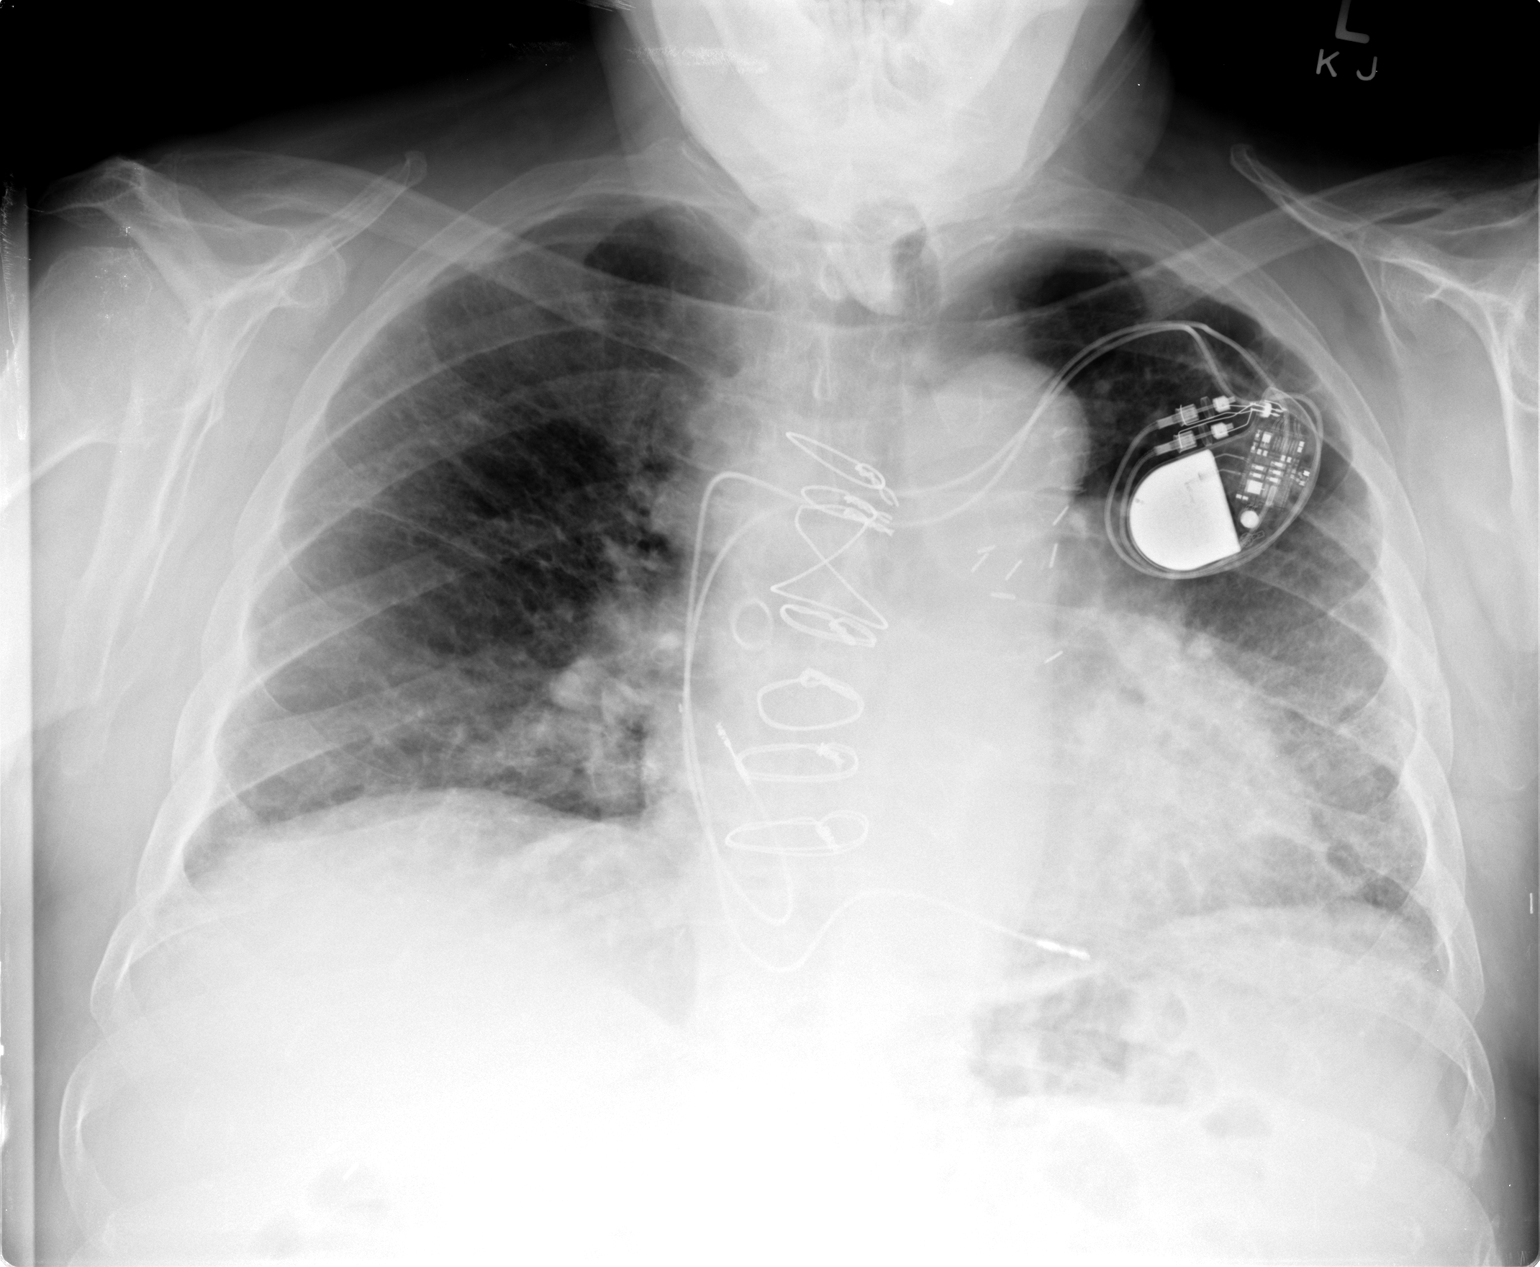

[view not recorded (2 of 2)]
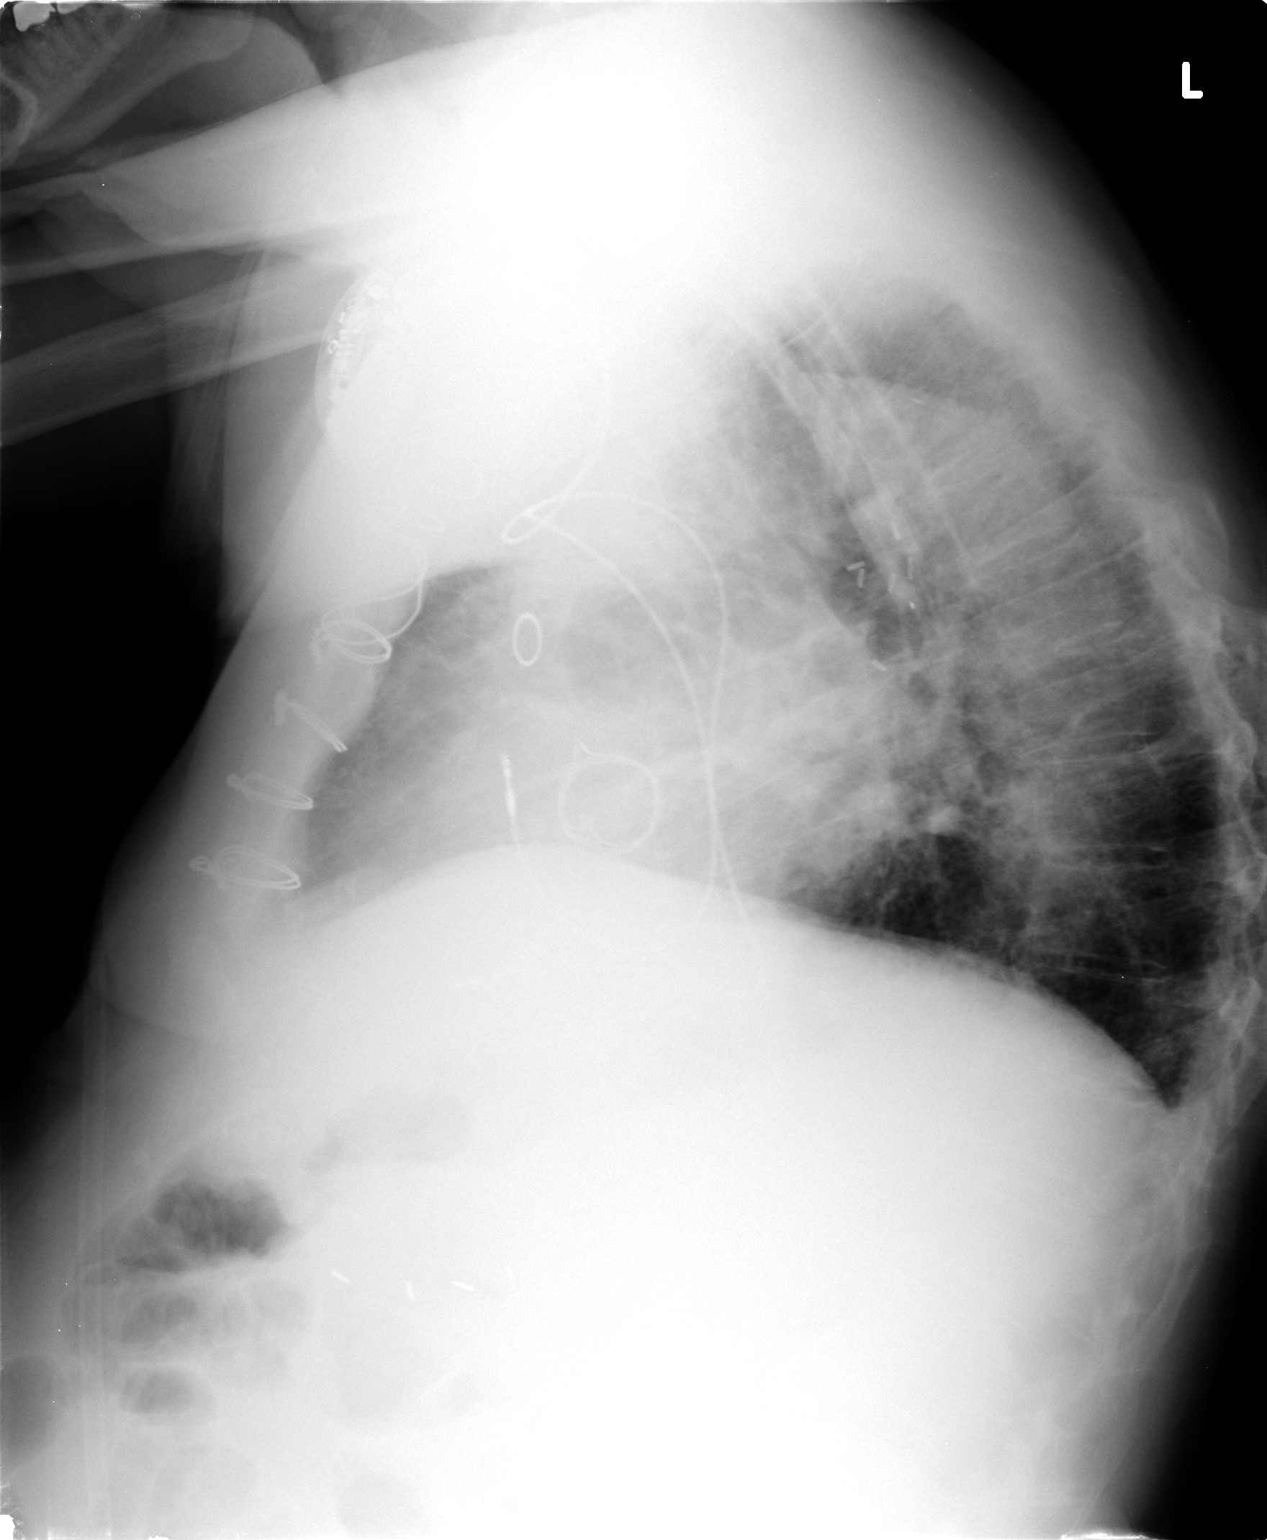

[2 of 2 positions shown; findings below may reference images not displayed]

FINDINGS: Lung volumes are low.  Prominent interstitial markings
throughout the mid and lower lungs bilaterally (left greater than
right) is unchanged.  No definite pleural effusions.  Crowding of
the pulmonary vasculature, without frank pulmonary edema.  Heart
size is borderline enlarged. The patient is rotated to the left on
today's exam, resulting in distortion of the mediastinal contours
and reduced diagnostic sensitivity and specificity for mediastinal
pathology.  Atherosclerosis in the thoracic aorta.  Status post
median sternotomy for CABG.  A left-sided pacemaker device in place
with lead tips projecting over the expected location of the right
atrium and right ventricular apex.  Status post aortic valve
replacement (stented bioprosthesis).
IMPRESSION: 1.  Patchy multifocal interstitial predominant opacities throughout
the mid lower lungs bilaterally (left greater than right).  While
the possibility of an acute infection is difficult to exclude, some
of these findings are chronic and given the low lung volumes could
suggest an underlying interstitial lung disease.  Clinical
correlation is recommended.  If there is concern for interstitial
lung disease, follow-up high-resolution chest CT may be warranted.
2.  Postoperative changes and support apparatus, as above.
3.  Atherosclerosis.

## 2014-06-02 ENCOUNTER — Ambulatory Visit: Payer: Self-pay | Admitting: Internal Medicine

## 2014-06-02 DIAGNOSIS — Z7901 Long term (current) use of anticoagulants: Secondary | ICD-10-CM

## 2014-06-02 DIAGNOSIS — I4891 Unspecified atrial fibrillation: Secondary | ICD-10-CM
